# Patient Record
Sex: Female | Born: 1970 | Race: Black or African American | Hispanic: No | Smoking: Former smoker
Health system: Southern US, Community
[De-identification: ages and names within clinical notes are randomized; demographics above are authoritative.]

## PROBLEM LIST (undated history)

## (undated) DIAGNOSIS — I10 Essential (primary) hypertension: Secondary | ICD-10-CM

## (undated) DIAGNOSIS — Z973 Presence of spectacles and contact lenses: Secondary | ICD-10-CM

## (undated) DIAGNOSIS — R51 Headache: Secondary | ICD-10-CM

## (undated) DIAGNOSIS — N611 Abscess of the breast and nipple: Secondary | ICD-10-CM

## (undated) DIAGNOSIS — F329 Major depressive disorder, single episode, unspecified: Secondary | ICD-10-CM

## (undated) DIAGNOSIS — E669 Obesity, unspecified: Secondary | ICD-10-CM

## (undated) HISTORY — DX: Obesity, unspecified: E66.9

## (undated) HISTORY — DX: Essential (primary) hypertension: I10

## (undated) HISTORY — PX: BREAST EXCISIONAL BIOPSY: SUR124

## (undated) HISTORY — DX: Abscess of the breast and nipple: N61.1

---

## 1998-04-01 ENCOUNTER — Emergency Department (HOSPITAL_COMMUNITY): Admission: EM | Admit: 1998-04-01 | Discharge: 1998-04-01 | Payer: Self-pay | Admitting: Emergency Medicine

## 1999-11-22 ENCOUNTER — Emergency Department (HOSPITAL_COMMUNITY): Admission: EM | Admit: 1999-11-22 | Discharge: 1999-11-22 | Payer: Self-pay | Admitting: Emergency Medicine

## 2000-02-20 ENCOUNTER — Inpatient Hospital Stay (HOSPITAL_COMMUNITY): Admission: AD | Admit: 2000-02-20 | Discharge: 2000-02-20 | Payer: Self-pay | Admitting: Obstetrics

## 2000-11-07 ENCOUNTER — Ambulatory Visit (HOSPITAL_COMMUNITY): Admission: RE | Admit: 2000-11-07 | Discharge: 2000-11-07 | Payer: Self-pay | Admitting: *Deleted

## 2000-11-08 ENCOUNTER — Encounter: Admission: RE | Admit: 2000-11-08 | Discharge: 2000-11-08 | Payer: Self-pay | Admitting: Obstetrics

## 2000-11-22 ENCOUNTER — Encounter: Admission: RE | Admit: 2000-11-22 | Discharge: 2000-11-22 | Payer: Self-pay | Admitting: Obstetrics

## 2000-12-06 ENCOUNTER — Encounter: Admission: RE | Admit: 2000-12-06 | Discharge: 2000-12-06 | Payer: Self-pay | Admitting: Obstetrics

## 2001-01-10 ENCOUNTER — Encounter: Admission: RE | Admit: 2001-01-10 | Discharge: 2001-01-10 | Payer: Self-pay | Admitting: Obstetrics

## 2001-01-31 ENCOUNTER — Encounter: Admission: RE | Admit: 2001-01-31 | Discharge: 2001-01-31 | Payer: Self-pay | Admitting: Obstetrics

## 2001-02-06 ENCOUNTER — Encounter: Admission: RE | Admit: 2001-02-06 | Discharge: 2001-02-06 | Payer: Self-pay | Admitting: Obstetrics

## 2001-02-13 ENCOUNTER — Encounter: Admission: RE | Admit: 2001-02-13 | Discharge: 2001-02-13 | Payer: Self-pay | Admitting: Obstetrics

## 2001-02-15 ENCOUNTER — Encounter: Admission: RE | Admit: 2001-02-15 | Discharge: 2001-05-16 | Payer: Self-pay | Admitting: Obstetrics

## 2001-03-10 ENCOUNTER — Inpatient Hospital Stay (HOSPITAL_COMMUNITY): Admission: AD | Admit: 2001-03-10 | Discharge: 2001-03-10 | Payer: Self-pay | Admitting: *Deleted

## 2001-03-13 ENCOUNTER — Encounter: Admission: RE | Admit: 2001-03-13 | Discharge: 2001-03-13 | Payer: Self-pay | Admitting: Obstetrics & Gynecology

## 2001-03-13 ENCOUNTER — Encounter (HOSPITAL_COMMUNITY): Admission: RE | Admit: 2001-03-13 | Discharge: 2001-03-15 | Payer: Self-pay | Admitting: Obstetrics & Gynecology

## 2001-03-14 ENCOUNTER — Inpatient Hospital Stay (HOSPITAL_COMMUNITY): Admission: AD | Admit: 2001-03-14 | Discharge: 2001-03-17 | Payer: Self-pay | Admitting: Obstetrics & Gynecology

## 2001-03-14 ENCOUNTER — Encounter (INDEPENDENT_AMBULATORY_CARE_PROVIDER_SITE_OTHER): Payer: Self-pay

## 2001-03-19 ENCOUNTER — Inpatient Hospital Stay (HOSPITAL_COMMUNITY): Admission: AD | Admit: 2001-03-19 | Discharge: 2001-03-19 | Payer: Self-pay

## 2001-04-22 ENCOUNTER — Inpatient Hospital Stay (HOSPITAL_COMMUNITY): Admission: AD | Admit: 2001-04-22 | Discharge: 2001-04-22 | Payer: Self-pay | Admitting: Obstetrics & Gynecology

## 2003-06-09 ENCOUNTER — Encounter: Payer: Self-pay | Admitting: Emergency Medicine

## 2003-06-09 ENCOUNTER — Emergency Department (HOSPITAL_COMMUNITY): Admission: EM | Admit: 2003-06-09 | Discharge: 2003-06-09 | Payer: Self-pay | Admitting: Emergency Medicine

## 2004-01-03 ENCOUNTER — Emergency Department (HOSPITAL_COMMUNITY): Admission: EM | Admit: 2004-01-03 | Discharge: 2004-01-03 | Payer: Self-pay | Admitting: Emergency Medicine

## 2005-10-02 DIAGNOSIS — F32A Depression, unspecified: Secondary | ICD-10-CM

## 2005-10-02 HISTORY — DX: Depression, unspecified: F32.A

## 2006-07-18 ENCOUNTER — Ambulatory Visit: Payer: Self-pay | Admitting: Nurse Practitioner

## 2006-07-18 ENCOUNTER — Ambulatory Visit: Payer: Self-pay | Admitting: *Deleted

## 2006-11-21 ENCOUNTER — Emergency Department (HOSPITAL_COMMUNITY): Admission: EM | Admit: 2006-11-21 | Discharge: 2006-11-21 | Payer: Self-pay | Admitting: Emergency Medicine

## 2007-04-19 ENCOUNTER — Emergency Department (HOSPITAL_COMMUNITY): Admission: EM | Admit: 2007-04-19 | Discharge: 2007-04-19 | Payer: Self-pay | Admitting: Emergency Medicine

## 2007-06-19 ENCOUNTER — Encounter (INDEPENDENT_AMBULATORY_CARE_PROVIDER_SITE_OTHER): Payer: Self-pay | Admitting: *Deleted

## 2007-06-27 ENCOUNTER — Ambulatory Visit: Payer: Self-pay | Admitting: Family Medicine

## 2007-07-23 ENCOUNTER — Ambulatory Visit: Payer: Self-pay | Admitting: Family Medicine

## 2007-10-03 DIAGNOSIS — I10 Essential (primary) hypertension: Secondary | ICD-10-CM

## 2007-10-03 HISTORY — DX: Essential (primary) hypertension: I10

## 2007-10-04 ENCOUNTER — Emergency Department (HOSPITAL_COMMUNITY): Admission: EM | Admit: 2007-10-04 | Discharge: 2007-10-04 | Payer: Self-pay | Admitting: Emergency Medicine

## 2007-12-26 ENCOUNTER — Emergency Department (HOSPITAL_COMMUNITY): Admission: EM | Admit: 2007-12-26 | Discharge: 2007-12-27 | Payer: Self-pay | Admitting: Emergency Medicine

## 2008-01-17 ENCOUNTER — Ambulatory Visit: Payer: Self-pay | Admitting: Internal Medicine

## 2008-01-23 ENCOUNTER — Ambulatory Visit (HOSPITAL_COMMUNITY): Admission: RE | Admit: 2008-01-23 | Discharge: 2008-01-23 | Payer: Self-pay | Admitting: Family Medicine

## 2008-11-23 ENCOUNTER — Ambulatory Visit: Payer: Self-pay | Admitting: Family Medicine

## 2009-02-01 ENCOUNTER — Ambulatory Visit: Payer: Self-pay | Admitting: Internal Medicine

## 2009-02-23 ENCOUNTER — Ambulatory Visit (HOSPITAL_COMMUNITY): Admission: RE | Admit: 2009-02-23 | Discharge: 2009-02-23 | Payer: Self-pay | Admitting: Internal Medicine

## 2009-11-20 ENCOUNTER — Emergency Department (HOSPITAL_COMMUNITY): Admission: EM | Admit: 2009-11-20 | Discharge: 2009-11-20 | Payer: Self-pay | Admitting: Emergency Medicine

## 2009-11-22 ENCOUNTER — Emergency Department (HOSPITAL_COMMUNITY): Admission: EM | Admit: 2009-11-22 | Discharge: 2009-11-22 | Payer: Self-pay | Admitting: Emergency Medicine

## 2010-03-10 ENCOUNTER — Emergency Department (HOSPITAL_COMMUNITY): Admission: EM | Admit: 2010-03-10 | Discharge: 2010-03-10 | Payer: Self-pay | Admitting: Emergency Medicine

## 2010-04-06 ENCOUNTER — Ambulatory Visit: Payer: Self-pay | Admitting: Internal Medicine

## 2010-04-06 LAB — CONVERTED CEMR LAB: Microalb, Ur: 10.67 mg/dL — ABNORMAL HIGH (ref 0.00–1.89)

## 2010-04-21 ENCOUNTER — Ambulatory Visit: Payer: Self-pay | Admitting: Internal Medicine

## 2010-04-21 LAB — CONVERTED CEMR LAB
ALT: 9 units/L (ref 0–35)
AST: 9 units/L (ref 0–37)
Albumin: 4.6 g/dL (ref 3.5–5.2)
Alkaline Phosphatase: 97 units/L (ref 39–117)
BUN: 12 mg/dL (ref 6–23)
CO2: 21 meq/L (ref 19–32)
Calcium: 9.7 mg/dL (ref 8.4–10.5)
Chloride: 100 meq/L (ref 96–112)
Creatinine, Ser: 0.68 mg/dL (ref 0.40–1.20)
Direct LDL: 131 mg/dL — ABNORMAL HIGH
Glucose, Bld: 152 mg/dL — ABNORMAL HIGH (ref 70–99)
Potassium: 4.2 meq/L (ref 3.5–5.3)
Sodium: 135 meq/L (ref 135–145)
Total Bilirubin: 0.4 mg/dL (ref 0.3–1.2)
Total Protein: 8 g/dL (ref 6.0–8.3)

## 2010-05-03 ENCOUNTER — Ambulatory Visit: Payer: Self-pay | Admitting: Internal Medicine

## 2010-05-04 ENCOUNTER — Encounter (INDEPENDENT_AMBULATORY_CARE_PROVIDER_SITE_OTHER): Payer: Self-pay | Admitting: Internal Medicine

## 2010-05-19 ENCOUNTER — Ambulatory Visit: Payer: Self-pay | Admitting: Internal Medicine

## 2010-06-16 ENCOUNTER — Encounter (INDEPENDENT_AMBULATORY_CARE_PROVIDER_SITE_OTHER): Payer: Self-pay | Admitting: Internal Medicine

## 2010-06-16 LAB — CONVERTED CEMR LAB
BUN: 15 mg/dL (ref 6–23)
CO2: 26 meq/L (ref 19–32)
Calcium: 8.9 mg/dL (ref 8.4–10.5)
Chloride: 102 meq/L (ref 96–112)
Cholesterol: 199 mg/dL (ref 0–200)
Creatinine, Ser: 0.94 mg/dL (ref 0.40–1.20)
Glucose, Bld: 142 mg/dL — ABNORMAL HIGH (ref 70–99)
HDL: 37 mg/dL — ABNORMAL LOW (ref >39)
Hgb A1c MFr Bld: 7.4 % — ABNORMAL HIGH (ref <5.7)
LDL Cholesterol: 144 mg/dL — ABNORMAL HIGH (ref 0–99)
Potassium: 4.3 meq/L (ref 3.5–5.3)
Sodium: 138 meq/L (ref 135–145)
TSH: 1.104 microintl units/mL (ref 0.350–4.500)
Total CHOL/HDL Ratio: 5.4
Triglycerides: 89 mg/dL (ref <150)
VLDL: 18 mg/dL (ref 0–40)

## 2010-10-18 ENCOUNTER — Emergency Department (HOSPITAL_COMMUNITY)
Admission: EM | Admit: 2010-10-18 | Discharge: 2010-10-18 | Payer: Self-pay | Source: Home / Self Care | Admitting: Emergency Medicine

## 2010-10-19 LAB — CBC
HCT: 38.8 % (ref 36.0–46.0)
Hemoglobin: 13 g/dL (ref 12.0–15.0)
MCH: 25.8 pg — ABNORMAL LOW (ref 26.0–34.0)
MCHC: 33.5 g/dL (ref 30.0–36.0)
MCV: 77.1 fL — ABNORMAL LOW (ref 78.0–100.0)
Platelets: 368 10*3/uL (ref 150–400)
RBC: 5.03 MIL/uL (ref 3.87–5.11)
RDW: 14.3 % (ref 11.5–15.5)
WBC: 18.6 10*3/uL — ABNORMAL HIGH (ref 4.0–10.5)

## 2010-10-19 LAB — DIFFERENTIAL
Basophils Absolute: 0 10*3/uL (ref 0.0–0.1)
Basophils Relative: 0 % (ref 0–1)
Eosinophils Absolute: 0.1 10*3/uL (ref 0.0–0.7)
Eosinophils Relative: 0 % (ref 0–5)
Lymphocytes Relative: 10 % — ABNORMAL LOW (ref 12–46)
Lymphs Abs: 1.9 10*3/uL (ref 0.7–4.0)
Monocytes Absolute: 1 10*3/uL (ref 0.1–1.0)
Monocytes Relative: 5 % (ref 3–12)
Neutro Abs: 15.7 10*3/uL — ABNORMAL HIGH (ref 1.7–7.7)
Neutrophils Relative %: 84 % — ABNORMAL HIGH (ref 43–77)

## 2010-10-19 LAB — GLUCOSE, CAPILLARY
Glucose-Capillary: 223 mg/dL — ABNORMAL HIGH (ref 70–99)
Glucose-Capillary: 224 mg/dL — ABNORMAL HIGH (ref 70–99)

## 2010-10-19 LAB — URINALYSIS, ROUTINE W REFLEX MICROSCOPIC
Bilirubin Urine: NEGATIVE
Hgb urine dipstick: NEGATIVE
Ketones, ur: 15 mg/dL — AB
Nitrite: NEGATIVE
Protein, ur: >300 mg/dL — AB
Specific Gravity, Urine: 1.019 (ref 1.005–1.030)
Urine Glucose, Fasting: NEGATIVE mg/dL
Urobilinogen, UA: 1 mg/dL (ref 0.0–1.0)
pH: 6 (ref 5.0–8.0)

## 2010-10-19 LAB — COMPREHENSIVE METABOLIC PANEL
ALT: 12 U/L (ref 0–35)
AST: 14 U/L (ref 0–37)
Albumin: 4.1 g/dL (ref 3.5–5.2)
Alkaline Phosphatase: 95 U/L (ref 39–117)
BUN: 6 mg/dL (ref 6–23)
CO2: 24 mEq/L (ref 19–32)
Calcium: 9.7 mg/dL (ref 8.4–10.5)
Chloride: 102 mEq/L (ref 96–112)
Creatinine, Ser: 0.57 mg/dL (ref 0.4–1.2)
GFR calc Af Amer: >60 mL/min (ref >60)
GFR calc non Af Amer: >60 mL/min (ref >60)
Glucose, Bld: 209 mg/dL — ABNORMAL HIGH (ref 70–99)
Potassium: 3.8 mEq/L (ref 3.5–5.1)
Sodium: 138 mEq/L (ref 135–145)
Total Bilirubin: 0.6 mg/dL (ref 0.3–1.2)
Total Protein: 8.5 g/dL — ABNORMAL HIGH (ref 6.0–8.3)

## 2010-10-19 LAB — URINE MICROSCOPIC-ADD ON

## 2010-10-19 LAB — POCT PREGNANCY, URINE: Preg Test, Ur: NEGATIVE

## 2010-10-23 ENCOUNTER — Encounter: Payer: Self-pay | Admitting: Nurse Practitioner

## 2010-10-24 ENCOUNTER — Encounter: Payer: Self-pay | Admitting: Family Medicine

## 2010-12-19 LAB — URINALYSIS, ROUTINE W REFLEX MICROSCOPIC
Bilirubin Urine: NEGATIVE
Glucose, UA: 100 mg/dL — AB
Ketones, ur: NEGATIVE mg/dL
Nitrite: NEGATIVE
Protein, ur: 30 mg/dL — AB
Specific Gravity, Urine: 1.018 (ref 1.005–1.030)
Urobilinogen, UA: 1 mg/dL (ref 0.0–1.0)
pH: 6 (ref 5.0–8.0)

## 2010-12-19 LAB — GC/CHLAMYDIA PROBE AMP, GENITAL
Chlamydia, DNA Probe: NEGATIVE
GC Probe Amp, Genital: NEGATIVE

## 2010-12-19 LAB — CBC
HCT: 35.8 % — ABNORMAL LOW (ref 36.0–46.0)
Hemoglobin: 12.1 g/dL (ref 12.0–15.0)
MCHC: 33.8 g/dL (ref 30.0–36.0)
MCV: 80.7 fL (ref 78.0–100.0)
Platelets: 331 10*3/uL (ref 150–400)
RBC: 4.43 MIL/uL (ref 3.87–5.11)
RDW: 13.1 % (ref 11.5–15.5)
WBC: 11.4 10*3/uL — ABNORMAL HIGH (ref 4.0–10.5)

## 2010-12-19 LAB — URINE CULTURE: Colony Count: 75000

## 2010-12-19 LAB — POCT I-STAT, CHEM 8
BUN: 7 mg/dL (ref 6–23)
Calcium, Ion: 1.16 mmol/L (ref 1.12–1.32)
Chloride: 103 mEq/L (ref 96–112)
Creatinine, Ser: 0.5 mg/dL (ref 0.4–1.2)
Glucose, Bld: 264 mg/dL — ABNORMAL HIGH (ref 70–99)
HCT: 38 % (ref 36.0–46.0)
Hemoglobin: 12.9 g/dL (ref 12.0–15.0)
Potassium: 3.8 mEq/L (ref 3.5–5.1)
Sodium: 139 mEq/L (ref 135–145)
TCO2: 28 mmol/L (ref 0–100)

## 2010-12-19 LAB — DIFFERENTIAL
Basophils Absolute: 0 10*3/uL (ref 0.0–0.1)
Basophils Relative: 0 % (ref 0–1)
Eosinophils Absolute: 0.2 10*3/uL (ref 0.0–0.7)
Eosinophils Relative: 1 % (ref 0–5)
Lymphocytes Relative: 16 % (ref 12–46)
Lymphs Abs: 1.8 10*3/uL (ref 0.7–4.0)
Monocytes Absolute: 0.6 10*3/uL (ref 0.1–1.0)
Monocytes Relative: 6 % (ref 3–12)
Neutro Abs: 8.7 10*3/uL — ABNORMAL HIGH (ref 1.7–7.7)
Neutrophils Relative %: 77 % (ref 43–77)

## 2010-12-19 LAB — URINE MICROSCOPIC-ADD ON

## 2010-12-19 LAB — PREGNANCY, URINE: Preg Test, Ur: NEGATIVE

## 2010-12-19 LAB — GLUCOSE, CAPILLARY: Glucose-Capillary: 229 mg/dL — ABNORMAL HIGH (ref 70–99)

## 2010-12-19 LAB — WET PREP, GENITAL

## 2010-12-23 LAB — CBC
HCT: 37.5 % (ref 36.0–46.0)
Hemoglobin: 12.6 g/dL (ref 12.0–15.0)
MCHC: 33.8 g/dL (ref 30.0–36.0)
MCV: 80.4 fL (ref 78.0–100.0)
Platelets: 305 K/uL (ref 150–400)
RBC: 4.66 MIL/uL (ref 3.87–5.11)
RDW: 14.4 % (ref 11.5–15.5)
WBC: 28.2 K/uL — ABNORMAL HIGH (ref 4.0–10.5)

## 2010-12-23 LAB — DIFFERENTIAL
Basophils Relative: 0 % (ref 0–1)
Eosinophils Relative: 0 % (ref 0–5)
Lymphs Abs: 2.3 10*3/uL (ref 0.7–4.0)
Monocytes Absolute: 2 10*3/uL — ABNORMAL HIGH (ref 0.1–1.0)
Monocytes Relative: 7 % (ref 3–12)

## 2010-12-23 LAB — GLUCOSE, CAPILLARY

## 2011-01-19 ENCOUNTER — Inpatient Hospital Stay (INDEPENDENT_AMBULATORY_CARE_PROVIDER_SITE_OTHER)
Admission: RE | Admit: 2011-01-19 | Discharge: 2011-01-19 | Disposition: A | Discharge status: Home / Self Care | Payer: Self-pay | Source: Ambulatory Visit | Attending: Family Medicine | Admitting: Family Medicine

## 2011-01-19 DIAGNOSIS — B9789 Other viral agents as the cause of diseases classified elsewhere: Secondary | ICD-10-CM

## 2011-01-19 DIAGNOSIS — J029 Acute pharyngitis, unspecified: Secondary | ICD-10-CM

## 2011-01-19 LAB — POCT RAPID STREP A (OFFICE): Streptococcus, Group A Screen (Direct): NEGATIVE

## 2011-02-17 NOTE — Discharge Summary (Signed)
[facility]  Patient:    Sheila Bishop, PELLICANO Visit Number: [id number] MRN: [id number]          Service Type: GYN Location: [facility] Attending Physician:  Antionette Char Dictated by:   Michell Heinrich, M.D. Admit Date:  04/22/2001 Discharge Date: 04/22/2001                             Discharge Summary  ADMITTING DIAGNOSES:          1. Full-term intrauterine gestation.                               2. History of previous cesarean delivery.                               3. Gestational hypertension.                               4. Gestational diabetes--poorly controlled.  DISCHARGE DIAGNOSES:          1. Full-term intrauterine gestation.                               2. History of previous cesarean delivery.                               3. Gestational hypertension.                               4. Gestational diabetes--poorly controlled.                               5. Successful cesarean delivery of a viable                                  female infant.  SERVICE:                      OB Teaching Service.  ATTENDING:                    Roseanna Rainbow, M.D.  RESIDENT:                     Michell Heinrich, M.D.  CONSULTS:                     None.  PROCEDURES:                   On March 14, 2001: Elective low transverse                               cesarean section.  FINDINGS:                     Viable female infant with Apgars of 8 at one and  9 at five minutes.  HISTORY AND PHYSICAL:         For complete H&P, please see resident H&P in chart. Briefly, this is a 40 year old multipara with a history of prior cesarean delivery who presented at 40+ weeks gestation for elective cesarean delivery. The patient had a history of gestational hypertension and poorly controlled gestational diabetes mellitus this pregnancy.  PHYSICAL EXAMINATION:         Physical exam at presentation was notable for  a blood pressure of 140/80 with vitals otherwise normal. Physical exam revealed a gravid uterus and a cervix which was long and closed.  HOSPITAL COURSE:              The patient was admitted to the hospital for her elective repeat low transverse cesarean section and this was performed on March 14, 2001 without complication. Postoperatively, her blood pressure was in the normal range and she had no infectious problems. She had no difficulty with ambulation or advancement of diet, and had only a small amount of incisional pain. The patient was discharged home in improved condition and was to follow up with [facility] in six weeks. She was bottle feeding the baby.  DISCHARGE MEDICATIONS:        1. Percocet one to two q.4-6h. p.r.n. pain.                               2. Ibuprofen 600 mg q.6h. p.r.n. pain.                               3. Prenatal vitamin for six weeks. ictated by:   Michell Heinrich, M.D. Attending Physician:  Antionette Char DD:  07/31/01 TD:  07/31/01 Job: [id number]

## 2011-02-17 NOTE — Op Note (Signed)
[facility]  Patient:    Sheila Bishop, Sheila Bishop                      MRN: [id number] Proc. Date: 03/14/01 Adm. Date:  [id number] Disc. Date: [id number] Attending:  Antionette Char                           Operative Report  PREOPERATIVE DIAGNOSES:       Intrauterine pregnancy at 40+ weeks. Gestational diabetes with poorly controlled gestational hypertension.  History of previous cesarean delivery; declines trial of labor.  Multiparity, desires permanent sterilization.  POSTOPERATIVE DIAGNOSES:      Intrauterine pregnancy at 40+ weeks. Gestational diabetes with poorly controlled gestational hypertension.  History of previous cesarean delivery, declines trial of labor.  Multiparity, desires permanent sterilization.  Meconium-stained amniotic fluids.  PROCEDURE:                    Repeat low transverse cesarean section and modified Pomeroy bilateral tubal ligation via Pfannenstiel.  SURGEON:                      Roseanna Rainbow, M.D.  ANESTHESIA:                   Spinal.  COMPLICATIONS:                None.  ESTIMATED BLOOD LOSS:         800 cc.  FLUIDS:                       1500 cc lactated Ringers.  URINE OUTPUT:                 300 cc clear urine at the end of the procedure.  INDICATIONS:                  The patient is a 40 year old multipara at 40+ weeks, with a history of previous cesarean section delivery, gestational diabetes and gestational hypertension.  She declines a trial of labor and desires a sterilization procedure.  FINDINGS:                     Infant in cephalic presentation.  Thin meconium noted.  The neonatologist was present at delivery.  The infant weighed 8 pounds 13 ounces.  Normal uterus, tubes and ovaries.  DESCRIPTION OF PROCEDURE:     The patient was taken to the operating room, where a spinal anesthetic was found to be adequate.  She was then prepped and draped in the usual sterile fashion, in the dorsal  supine position with a leftward tilt.  A Pfannenstiel skin incision was then made with the scalpel, carried through to the underlying layer of fascia.  The fascia was incised in the midline and the incision extended laterally with Mayo scissors.  The superior aspect of the fascial incision was then grasped with the Kocher clamp, elevated and the underlying rectus muscle dissected off.  Attention was then turned to the inferior aspect of this incision, which was manipulated in a similar fashion.  The rectus muscles were separated in the midline and the parietoperitoneum entered sharply.  The peritoneal incision was then extended superiorly and inferiorly with good visualization of the bladder.  The bladder blade was then inserted and the vesicouterine peritoneum identified, grasped with pickups and entered sharply with Metzenbaum  scissors.  This incision was then extended laterally and a bladder flap created sharply.  The bladder blade was then reinserted and the lower uterine segment incised in transverse fashion with the scalpel.  The uterine incision was then extended laterally with the bandage scissors.  The bladder blade was then removed and the infants head delivered.  The nose and mouth were suctioned, the cord was clamped and cut.  The infant was handed off to the awaiting neonatologist. Cord ______ were sent.  The placenta was then removed.  The uterus was exteriorized and cleared of all clots and debris.  The uterine incision was repaired with 0 Monocryl in a running locked fashion.  Excellent hemostasis was noted.  Attention was then turned to the fallopian tubes.  The left fallopian tube was grasped with the Babcock clamp, approximately 4 cm from the uterine cornua.  A 2 cm segment of tube was then doubly ligated with three ties of plain gut and excised.   Excellent hemostasis was noted.  The right fallopian tube was then manipulated in a similar fashion.  The uterus was  returned to the abdomen. The gutters were cleared of all clots.  The fascia was reapproximated with 0 PDS in a running fashion.  The skin was closed with staples.  The patient tolerated the procedure well.  Sponge, lap and needle counts were correct x 2. Cefotetan 1 g was given at cord clamp.  The patient was taken to the PACU in stable condition. DD:  03/18/01 TD:  03/18/01 Job: [id number]

## 2011-03-15 ENCOUNTER — Other Ambulatory Visit: Payer: Self-pay | Admitting: Family Medicine

## 2011-03-15 DIAGNOSIS — N644 Mastodynia: Secondary | ICD-10-CM

## 2011-03-15 DIAGNOSIS — N6320 Unspecified lump in the left breast, unspecified quadrant: Secondary | ICD-10-CM

## 2011-03-21 ENCOUNTER — Other Ambulatory Visit: Payer: Self-pay | Admitting: Family Medicine

## 2011-03-21 ENCOUNTER — Ambulatory Visit
Admission: RE | Admit: 2011-03-21 | Discharge: 2011-03-21 | Disposition: A | Discharge status: Home / Self Care | Payer: Self-pay | Source: Ambulatory Visit | Attending: Family Medicine | Admitting: Family Medicine

## 2011-03-21 DIAGNOSIS — N644 Mastodynia: Secondary | ICD-10-CM

## 2011-03-21 DIAGNOSIS — N6320 Unspecified lump in the left breast, unspecified quadrant: Secondary | ICD-10-CM

## 2011-04-01 ENCOUNTER — Emergency Department (HOSPITAL_COMMUNITY)
Admission: EM | Admit: 2011-04-01 | Discharge: 2011-04-02 | Disposition: A | Discharge status: Home / Self Care | Payer: Self-pay | Attending: Emergency Medicine | Admitting: Emergency Medicine

## 2011-04-01 DIAGNOSIS — D573 Sickle-cell trait: Secondary | ICD-10-CM | POA: Insufficient documentation

## 2011-04-01 DIAGNOSIS — Z79899 Other long term (current) drug therapy: Secondary | ICD-10-CM | POA: Insufficient documentation

## 2011-04-01 DIAGNOSIS — E119 Type 2 diabetes mellitus without complications: Secondary | ICD-10-CM | POA: Insufficient documentation

## 2011-04-01 DIAGNOSIS — R51 Headache: Secondary | ICD-10-CM | POA: Insufficient documentation

## 2011-04-01 DIAGNOSIS — R42 Dizziness and giddiness: Secondary | ICD-10-CM | POA: Insufficient documentation

## 2011-04-01 DIAGNOSIS — R110 Nausea: Secondary | ICD-10-CM | POA: Insufficient documentation

## 2011-04-01 DIAGNOSIS — I10 Essential (primary) hypertension: Secondary | ICD-10-CM | POA: Insufficient documentation

## 2011-04-01 DIAGNOSIS — F319 Bipolar disorder, unspecified: Secondary | ICD-10-CM | POA: Insufficient documentation

## 2011-04-04 ENCOUNTER — Ambulatory Visit
Admission: RE | Admit: 2011-04-04 | Discharge: 2011-04-04 | Disposition: A | Discharge status: Home / Self Care | Payer: Self-pay | Source: Ambulatory Visit | Attending: Family Medicine | Admitting: Family Medicine

## 2011-04-04 ENCOUNTER — Other Ambulatory Visit: Payer: Self-pay | Admitting: Family Medicine

## 2011-04-04 DIAGNOSIS — N644 Mastodynia: Secondary | ICD-10-CM

## 2011-04-04 DIAGNOSIS — N6320 Unspecified lump in the left breast, unspecified quadrant: Secondary | ICD-10-CM

## 2011-04-04 DIAGNOSIS — N63 Unspecified lump in unspecified breast: Secondary | ICD-10-CM

## 2011-04-26 ENCOUNTER — Ambulatory Visit
Admission: RE | Admit: 2011-04-26 | Discharge: 2011-04-26 | Disposition: A | Discharge status: Home / Self Care | Payer: Self-pay | Source: Ambulatory Visit | Attending: Family Medicine | Admitting: Family Medicine

## 2011-04-26 ENCOUNTER — Other Ambulatory Visit: Payer: Self-pay | Admitting: Family Medicine

## 2011-04-26 DIAGNOSIS — N63 Unspecified lump in unspecified breast: Secondary | ICD-10-CM

## 2011-04-26 DIAGNOSIS — N6320 Unspecified lump in the left breast, unspecified quadrant: Secondary | ICD-10-CM

## 2011-05-02 ENCOUNTER — Other Ambulatory Visit: Payer: Self-pay | Admitting: Family Medicine

## 2011-05-02 ENCOUNTER — Ambulatory Visit
Admission: RE | Admit: 2011-05-02 | Discharge: 2011-05-02 | Disposition: A | Discharge status: Home / Self Care | Payer: Self-pay | Source: Ambulatory Visit | Attending: Family Medicine | Admitting: Family Medicine

## 2011-05-02 DIAGNOSIS — N6320 Unspecified lump in the left breast, unspecified quadrant: Secondary | ICD-10-CM

## 2011-05-03 ENCOUNTER — Other Ambulatory Visit: Payer: Self-pay | Admitting: Family Medicine

## 2011-05-03 DIAGNOSIS — N6320 Unspecified lump in the left breast, unspecified quadrant: Secondary | ICD-10-CM

## 2011-05-24 ENCOUNTER — Other Ambulatory Visit: Payer: Self-pay | Admitting: Family Medicine

## 2011-05-24 ENCOUNTER — Ambulatory Visit
Admission: RE | Admit: 2011-05-24 | Discharge: 2011-05-24 | Disposition: A | Discharge status: Home / Self Care | Payer: No Typology Code available for payment source | Source: Ambulatory Visit | Attending: Family Medicine | Admitting: Family Medicine

## 2011-05-24 DIAGNOSIS — N6320 Unspecified lump in the left breast, unspecified quadrant: Secondary | ICD-10-CM

## 2011-05-24 DIAGNOSIS — N63 Unspecified lump in unspecified breast: Secondary | ICD-10-CM

## 2011-06-07 ENCOUNTER — Other Ambulatory Visit: Payer: No Typology Code available for payment source

## 2011-06-26 LAB — COMPREHENSIVE METABOLIC PANEL
ALT: 28
AST: 56 — ABNORMAL HIGH
Albumin: 3.5
CO2: 26
Calcium: 8.9
Creatinine, Ser: 0.61
GFR calc Af Amer: >60
GFR calc non Af Amer: >60
Sodium: 138

## 2011-06-26 LAB — CBC
MCHC: 33.5
MCV: 79.9
Platelets: 303
RBC: 4.56
WBC: 17.5 — ABNORMAL HIGH

## 2011-06-26 LAB — LIPASE, BLOOD: Lipase: 22

## 2011-06-26 LAB — DIFFERENTIAL
Eosinophils Absolute: 0.2
Lymphocytes Relative: 14
Lymphs Abs: 2.4
Monocytes Relative: 5
Neutro Abs: 13.9 — ABNORMAL HIGH
Neutrophils Relative %: 80 — ABNORMAL HIGH

## 2011-06-26 LAB — D-DIMER, QUANTITATIVE: D-Dimer, Quant: 0.86 — ABNORMAL HIGH

## 2011-07-18 ENCOUNTER — Other Ambulatory Visit: Payer: Self-pay | Admitting: Family Medicine

## 2011-07-18 DIAGNOSIS — N63 Unspecified lump in unspecified breast: Secondary | ICD-10-CM

## 2011-07-19 ENCOUNTER — Ambulatory Visit
Admission: RE | Admit: 2011-07-19 | Discharge: 2011-07-19 | Disposition: A | Discharge status: Home / Self Care | Payer: No Typology Code available for payment source | Source: Ambulatory Visit | Attending: Family Medicine | Admitting: Family Medicine

## 2011-07-19 DIAGNOSIS — N63 Unspecified lump in unspecified breast: Secondary | ICD-10-CM

## 2011-07-21 ENCOUNTER — Ambulatory Visit (INDEPENDENT_AMBULATORY_CARE_PROVIDER_SITE_OTHER): Payer: PRIVATE HEALTH INSURANCE | Admitting: Surgery

## 2011-07-21 ENCOUNTER — Encounter (INDEPENDENT_AMBULATORY_CARE_PROVIDER_SITE_OTHER): Payer: Self-pay | Admitting: Surgery

## 2011-07-21 VITALS — BP 100/70 | HR 84 | Temp 98.0°F | Resp 16 | Ht 66.0 in | Wt 210.8 lb

## 2011-07-21 DIAGNOSIS — N611 Abscess of the breast and nipple: Secondary | ICD-10-CM | POA: Insufficient documentation

## 2011-07-21 DIAGNOSIS — N6019 Diffuse cystic mastopathy of unspecified breast: Secondary | ICD-10-CM

## 2011-07-21 MED ORDER — DOXYCYCLINE HYCLATE 100 MG PO TABS: 100.0000 mg | ORAL_TABLET | Freq: Two times a day (BID) | ORAL | Status: AC

## 2011-07-21 NOTE — Patient Instructions (Signed)
Mastitis  Mastitis is a bacterial infection of the breast tissue. CAUSES  Bacteria causes infection by entering the breast tissue through cuts or openings in the skin. Typically, this occurs with breastfeeding due to cracked or irritated skin. It can be associated with plugged ducts. Nipple piercing can also lead to mastitis. SYMPTOMS  In mastitis, an area of the breast becomes swollen, red, tender, and painful. You may notice you have a fever and swelling of the glands under your arm on that side. If the infection is allowed to progress, a collection of pus (abscess) may develop. DIAGNOSIS  Your caregiver can diagnose mastitis based on your symptoms and upon examination. The diagnosis can be confirmed if pus can be expressed from the breast. This pus can be examined in the lab to determine which bacteria are present. If an abscess has developed, the fluid in the abscess can be removed with a needle. This is used to confirm the diagnosis and determine the bacteria present. In most cases, pus will not be present. Blood tests can be done to determine if your body is fighting a bacterial infection. Sometimes, a mammogram or ultrasound will be recommended to exclude other breast diseases including cancer. Other rare forms of mastitis:  Tuberculosis mastitis is rare. The TB germ can affect the breast if it is present in some other part of the body. The breast may be slightly tender with a mass, but not tender or painful.   Syphilis of the nipple usually has an ulcer that is not tender.   Actinomycosis is a very rare bacterial infection of the breast that presents as a mass in the breast that is not tender or painful.   Phlebitis (inflammation of blood vessels) of the breast is an inflammation of the veins in the breast. It may be caused by tight fitting bras, surgery, or trauma to the breast.   Inflammatory carcinoma of the breast looks like mastitis because the breasts are red, swollen, or tender, but  it is a rare form of breast cancer.  TREATMENT  Antibiotic medication is used to treat the bacterial infection. Your caregiver will determine which bacteria are most likely to be causing the infection and select an antibiotic. This is sometimes changed based on the results of cultures, or if there is no response to the antibiotic selected. Antibiotics are usually given by mouth. If you are breastfeeding, it is important to continue to empty the breast. Your caregiver can tell you whether or not this milk is safe for your infant, or needs to be thrown away. Pain can usually be treated with medication. HOME CARE INSTRUCTIONS   Take your antibiotics as directed. Finish them even if you start to feel better.   Only take over-the-counter or prescription medication for pain, discomfort, or fever as directed by your caregiver.   If breastfeeding, keep your nipples clean and dry. Your caregiver may tell you to stop nursing until he or she feels it is safe for your baby. Use a breast pump as instructed if forced to stop nursing.   Do not wear a tight bra. Wear a good support bra.   Empty the first breast completely before going to the other breast. If your baby is not emptying your breasts completely for some reason, use a breast pump to empty your breasts.   If you go back to work, pump your breasts while at work to stay in time with your nursing schedule.   Increase your fluid intake especially if   you have a fever.   Avoid having your breasts get overly filled with milk (engorged).  SEEK MEDICAL CARE IF:   You develop pus-like (purulent) discharge from the breast.   Your symptoms get worse.   You do not seem to be responding to your treatment within 2 days.  SEEK IMMEDIATE MEDICAL CARE IF:   You have a fever.   Your pain and swelling is getting worse.   You develop pain that is not controlled with medicine.   You develop a red line extending from the breast toward your armpit.  Document  Released: 09/18/2005 Document Revised: 05/31/2011 Document Reviewed: 05/08/2008 Exit[employer] Patient Information 2012 [employer]. 

## 2011-07-21 NOTE — Progress Notes (Signed)
Chief Complaint  Patient presents with  . Other    new pt- eval lft br abscess    HPI Sheila Bishop is a 40 y.o. female.  HPI The patient presents at the request of Dr. Deboraha Sprang due to history of persistent left breast abscess and mastitis. She developed an abscess in her left breast back in July of this year and this was managed with drainage and antibiotics. It resolved. Recently, she developed more left breast swelling redness and pain in repeat ultrasound showed recurrence of a left breast abscess between the 12:00 and 3:00 position approximately 2 cm from the nipple. She's been on antibiotics off and on during this time and this seems to help. Once she comes off of antibiotics the abscess returns. Currently, she has no fever or chills. She only has left breast soreness and fullness in the area of the previous abscess.  Past Medical History  Diagnosis Date  . Breast abscess     left  . Diabetes mellitus   . Hypertension     Past Surgical History  Procedure Date  . Cesarean section     History reviewed. No pertinent family history.  Social History History  Substance Use Topics  . Smoking status: Former Games developer  . Smokeless tobacco: Not on file  . Alcohol Use: No    No Known Allergies  Current Outpatient Prescriptions  Medication Sig Dispense Refill  . diphenhydrAMINE (SOMINEX) 25 MG tablet Take 25 mg by mouth 2 (two) times daily.        Marland Kitchen FLUoxetine (PROZAC) 20 MG tablet Take 20 mg by mouth daily.        Marland Kitchen glipiZIDE (GLUCOTROL) 10 MG tablet Take 10 mg by mouth 2 (two) times daily before a meal.        . lisinopril (PRINIVIL,ZESTRIL) 20 MG tablet Take 20 mg by mouth daily.        . risperiDONE (RISPERDAL) 2 MG tablet Take 2 mg by mouth 2 (two) times daily.        . risperiDONE microspheres (RISPERDAL CONSTA) 37.5 MG injection Inject 37.5 mg into the muscle every 14 (fourteen) days.          Review of Systems Review of Systems  Constitutional: Negative for fever, chills  and unexpected weight change.  HENT: Negative for hearing loss, congestion, sore throat, trouble swallowing and voice change.   Eyes: Negative for visual disturbance.  Respiratory: Negative for cough and wheezing.   Cardiovascular: Negative for chest pain, palpitations and leg swelling.  Gastrointestinal: Negative for nausea, vomiting, abdominal pain, diarrhea, constipation, blood in stool, abdominal distention and anal bleeding.  Genitourinary: Negative for hematuria, vaginal bleeding and difficulty urinating.  Musculoskeletal: Negative for arthralgias.  Skin: Negative for rash and wound.  Neurological: Negative for seizures, syncope and headaches.  Hematological: Negative for adenopathy. Does not bruise/bleed easily.  Psychiatric/Behavioral: Negative for confusion.    Blood pressure 100/70, pulse 84, temperature 98 F (36.7 C), temperature source Temporal, resp. rate 16, height 5\' 6"  (1.676 m), weight 210 lb 12.8 oz (95.618 kg).  Physical Exam Physical Exam  Constitutional: She is oriented to person, place, and time. She appears well-developed and well-nourished.  HENT:  Head: Normocephalic and atraumatic.  Nose: Nose normal.  Eyes: EOM are normal. Pupils are equal, round, and reactive to light.  Neck: Normal range of motion. Neck supple.  Cardiovascular: Normal rate.  Exam reveals no friction rub.   No murmur heard. Pulmonary/Chest: Effort normal and breath sounds normal.  Left breast shows fullness in in the 12 to 3:00 position in the upper outer quadrant. This is mildly tender but not fluctuant. There is no nipple discharge. Left axilla normal. Right breast normal.  Musculoskeletal: Normal range of motion.  Neurological: She is alert and oriented to person, place, and time.  Skin: Skin is warm and dry.  Psychiatric: She has a normal mood and affect. Her behavior is normal. Judgment and thought content normal.    Data Reviewed Left breast ultrasound July 2012 and October  2012 reviewed  Persistent area left breast upper-outer quadrant 2 cm from the nipple between the 12 and 3:00 position consistent with chronic mastitis and abscess. Biopsy of this area done in July of 2012 negative for malignancy.  Assessment    Chronic left breast mastitis and abscess    Plan    I discussed options with the patient of management. She has been managed medically since July of this year. This helps in the short-term but the process recurs despite drainage and antibiotics. I recommended excision of this area in her left breast to hopefully keep this from recurring. Risk of surgery include bleeding, infection, the need to leave the wound open, recurrence and the need for further surgery. Success rates for the surgery or around 75% at relieving the patient's symptoms. Alternative therapy is medical management and she has tried this. I will place her on doxycycline for the next 2 weeks prior to surgery to hopefully reduce or infection risk and wound complication rate that this is an issue which I discussed with her today. She would like to proceed.       Keion Neels A. 07/21/2011, 12:03 PM

## 2011-08-02 ENCOUNTER — Other Ambulatory Visit (INDEPENDENT_AMBULATORY_CARE_PROVIDER_SITE_OTHER): Payer: Self-pay | Admitting: Surgery

## 2011-08-02 ENCOUNTER — Encounter (HOSPITAL_COMMUNITY): Payer: Self-pay

## 2011-08-02 ENCOUNTER — Encounter (HOSPITAL_COMMUNITY)
Admission: RE | Admit: 2011-08-02 | Discharge: 2011-08-02 | Disposition: A | Discharge status: Home / Self Care | Payer: Self-pay | Source: Ambulatory Visit | Attending: Surgery | Admitting: Surgery

## 2011-08-02 ENCOUNTER — Ambulatory Visit (HOSPITAL_COMMUNITY)
Admission: RE | Admit: 2011-08-02 | Discharge: 2011-08-02 | Disposition: A | Discharge status: Home / Self Care | Payer: Self-pay | Source: Ambulatory Visit | Attending: Surgery | Admitting: Surgery

## 2011-08-02 DIAGNOSIS — N63 Unspecified lump in unspecified breast: Secondary | ICD-10-CM

## 2011-08-02 DIAGNOSIS — Z01818 Encounter for other preprocedural examination: Secondary | ICD-10-CM | POA: Insufficient documentation

## 2011-08-02 DIAGNOSIS — Z01812 Encounter for preprocedural laboratory examination: Secondary | ICD-10-CM | POA: Insufficient documentation

## 2011-08-02 DIAGNOSIS — Z01810 Encounter for preprocedural cardiovascular examination: Secondary | ICD-10-CM | POA: Insufficient documentation

## 2011-08-02 HISTORY — DX: Headache: R51

## 2011-08-02 HISTORY — DX: Presence of spectacles and contact lenses: Z97.3

## 2011-08-02 HISTORY — DX: Major depressive disorder, single episode, unspecified: F32.9

## 2011-08-02 LAB — COMPREHENSIVE METABOLIC PANEL
Albumin: 3.5 g/dL (ref 3.5–5.2)
BUN: 8 mg/dL (ref 6–23)
Calcium: 9.4 mg/dL (ref 8.4–10.5)
Creatinine, Ser: 0.48 mg/dL — ABNORMAL LOW (ref 0.50–1.10)
Potassium: 3.7 mEq/L (ref 3.5–5.1)
Total Protein: 7.2 g/dL (ref 6.0–8.3)

## 2011-08-02 LAB — SURGICAL PCR SCREEN
MRSA, PCR: NEGATIVE
Staphylococcus aureus: POSITIVE — AB

## 2011-08-02 LAB — HEMOGLOBIN A1C
Hgb A1c MFr Bld: 7.7 % — ABNORMAL HIGH (ref <5.7)
Mean Plasma Glucose: 174 mg/dL — ABNORMAL HIGH (ref <117)

## 2011-08-02 LAB — CBC
HCT: 31.9 % — ABNORMAL LOW (ref 36.0–46.0)
MCHC: 32.6 g/dL (ref 30.0–36.0)
RDW: 13.9 % (ref 11.5–15.5)

## 2011-08-02 LAB — DIFFERENTIAL
Basophils Absolute: 0 10*3/uL (ref 0.0–0.1)
Eosinophils Relative: 2 % (ref 0–5)
Lymphocytes Relative: 26 % (ref 12–46)
Monocytes Absolute: 0.8 10*3/uL (ref 0.1–1.0)

## 2011-08-02 NOTE — Pre-Procedure Instructions (Signed)
[address]  08/02/2011   Your procedure is scheduled on:  08-07-2011   Report to [facility] at 700AM  Call this number if you have problems the morning of surgery: [phone]   Remember:   Do not eat food:After Midnight.  Do not drink clear liquids: 4 Hours before arrival.  Take these medicines the morning of surgery with A SIP OF WATER: Prozac 20 mg, risperidal 2 mg     Do not wear jewelry, make-up or nail polish.  Do not wear lotions, powders, or perfumes. You may wear deodorant.  Do not shave 48 hours prior to surgery.  Do not bring valuables to the hospital.  Contacts, dentures or bridgework may not be worn into surgery.  Leave suitcase in the car. After surgery it may be brought to your room.  For patients admitted to the hospital, checkout time is 11:00 AM the day of discharge.   Patients discharged the day of surgery will not be allowed to drive home.  Name and phone number of your driver:Marachia Barrett Henle  [phone]  Special Instructions: CHG Shower Use Special Wash: 1/2 bottle night before surgery and 1/2 bottle morning of surgery.   Please read over the following fact sheets that you were given: Coughing and Deep Breathing, MRSA Information and Surgical Site Infection Prevention

## 2011-08-03 DIAGNOSIS — N611 Abscess of the breast and nipple: Secondary | ICD-10-CM

## 2011-08-03 HISTORY — DX: Abscess of the breast and nipple: N61.1

## 2011-08-05 ENCOUNTER — Encounter (HOSPITAL_COMMUNITY): Payer: Self-pay | Admitting: Surgery

## 2011-08-05 NOTE — H&P (Signed)
Sheila Bishop   07/21/2011 11:00 AM Office Visit  MRN: [id number]   Description: 40 year old female  Provider: Dortha Schwalbe., MD  Department: [facility]        Diagnoses     Mastitis chronic   - Primary    610.1    left      Reason for Visit     Other    new pt- eval lft br abscess        Vitals - Last Recorded       BP Pulse Temp(Src) Resp Ht Wt    100/70  84  98 F (36.7 C) (Temporal)  16  5\' 6"  (1.676 m)  210 lb 12.8 oz (95.618 kg)          BMI              34.02 kg/m2                 Progress Notes     Nicholaos Schippers A., MD  07/21/2011 12:13 PM  SignedChief Complaint   Patient presents with   .  Other       new pt- eval lft br abscess      HPI Sheila Bishop is a 40 y.o. female.  HPI The patient presents at the request of Dr. Deboraha Sprang due to history of persistent left breast abscess and mastitis. She developed an abscess in her left breast back in July of this year and this was managed with drainage and antibiotics. It resolved. Recently, she developed more left breast swelling redness and pain in repeat ultrasound showed recurrence of a left breast abscess between the 12:00 and 3:00 position approximately 2 cm from the nipple. She's been on antibiotics off and on during this time and this seems to help. Once she comes off of antibiotics the abscess returns. Currently, she has no fever or chills. She only has left breast soreness and fullness in the area of the previous abscess.    Past Medical History   Diagnosis  Date   .  Breast abscess         left   .  Diabetes mellitus     .  Hypertension         Past Surgical History   Procedure  Date   .  Cesarean section        History reviewed. No pertinent family history.   Social History History   Substance Use Topics   .  Smoking status:  Former Games developer   .  Smokeless tobacco:  Not on file   .  Alcohol Use:  No      No Known Allergies    Current Outpatient Prescriptions     Medication  Sig  Dispense  Refill   .  diphenhydrAMINE (SOMINEX) 25 MG tablet  Take 25 mg by mouth 2 (two) times daily.           Marland Kitchen  FLUoxetine (PROZAC) 20 MG tablet  Take 20 mg by mouth daily.           Marland Kitchen  glipiZIDE (GLUCOTROL) 10 MG tablet  Take 10 mg by mouth 2 (two) times daily before a meal.           .  lisinopril (PRINIVIL,ZESTRIL) 20 MG tablet  Take 20 mg by mouth daily.           .  risperiDONE (RISPERDAL) 2 MG tablet  Take 2 mg by mouth 2 (two) times  daily.           .  risperiDONE microspheres (RISPERDAL CONSTA) 37.5 MG injection  Inject 37.5 mg into the muscle every 14 (fourteen) days.              Review of Systems Review of Systems  Constitutional: Negative for fever, chills and unexpected weight change.  HENT: Negative for hearing loss, congestion, sore throat, trouble swallowing and voice change.   Eyes: Negative for visual disturbance.  Respiratory: Negative for cough and wheezing.   Cardiovascular: Negative for chest pain, palpitations and leg swelling.  Gastrointestinal: Negative for nausea, vomiting, abdominal pain, diarrhea, constipation, blood in stool, abdominal distention and anal bleeding.  Genitourinary: Negative for hematuria, vaginal bleeding and difficulty urinating.  Musculoskeletal: Negative for arthralgias.  Skin: Negative for rash and wound.  Neurological: Negative for seizures, syncope and headaches.  Hematological: Negative for adenopathy. Does not bruise/bleed easily.  Psychiatric/Behavioral: Negative for confusion.    Blood pressure 100/70, pulse 84, temperature 98 F (36.7 C), temperature source Temporal, resp. rate 16, height 5\' 6"  (1.676 m), weight 210 lb 12.8 oz (95.618 kg).   Physical Exam Physical Exam  Constitutional: She is oriented to person, place, and time. She appears well-developed and well-nourished.  HENT:   Head: Normocephalic and atraumatic.   Nose: Nose normal.  Eyes: EOM are normal. Pupils are equal, round, and reactive to  light.  Neck: Normal range of motion. Neck supple.  Cardiovascular: Normal rate.  Exam reveals no friction rub.    No murmur heard. Pulmonary/Chest: Effort normal and breath sounds normal.       Left breast shows fullness in in the 12 to 3:00 position in the upper outer quadrant. This is mildly tender but not fluctuant. There is no nipple discharge. Left axilla normal. Right breast normal.  Musculoskeletal: Normal range of motion.  Neurological: She is alert and oriented to person, place, and time.  Skin: Skin is warm and dry.  Psychiatric: She has a normal mood and affect. Her behavior is normal. Judgment and thought content normal.    Data Reviewed Left breast ultrasound July 2012 and October 2012 reviewed   Persistent area left breast upper-outer quadrant 2 cm from the nipple between the 12 and 3:00 position consistent with chronic mastitis and abscess. Biopsy of this area done in July of 2012 negative for malignancy.   Assessment Chronic left breast mastitis and abscess   Plan   I discussed options with the patient of management. She has been managed medically since July of this year. This helps in the short-term but the process recurs despite drainage and antibiotics. I recommended excision of this area in her left breast to hopefully keep this from recurring. Risk of surgery include bleeding, infection, the need to leave the wound open, recurrence and the need for further surgery. Success rates for the surgery or around 75% at relieving the patient's symptoms. Alternative therapy is medical management and she has tried this. I will place her on doxycycline for the next 2 weeks prior to surgery to hopefully reduce or infection risk and wound complication rate that this is an issue which I discussed with her today. She would like to proceed.       D'Arcy Abraha A. 07/21/2011, 12:03 PM                Not recorded     Medications Ordered This Encounter         Disp  Refills Start End  doxycycline (VIBRA-TABS) 100 MG tablet 28 tablet 0 07/21/2011 08/04/2011    Take 1 tablet (100 mg total) by mouth 2 (two) times daily. - Oral             Patient Instructions     Mastitis    Mastitis is a bacterial infection of the breast tissue. CAUSES   Bacteria causes infection by entering the breast tissue through cuts or openings in the skin. Typically, this occurs with breastfeeding due to cracked or irritated skin. It can be associated with plugged ducts. Nipple piercing can also lead to mastitis. SYMPTOMS   In mastitis, an area of the breast becomes swollen, red, tender, and painful. You may notice you have a fever and swelling of the glands under your arm on that side. If the infection is allowed to progress, a collection of pus (abscess) may develop. DIAGNOSIS   Your caregiver can diagnose mastitis based on your symptoms and upon examination. The diagnosis can be confirmed if pus can be expressed from the breast. This pus can be examined in the lab to determine which bacteria are present. If an abscess has developed, the fluid in the abscess can be removed with a needle. This is used to confirm the diagnosis and determine the bacteria present. In most cases, pus will not be present. Blood tests can be done to determine if your body is fighting a bacterial infection. Sometimes, a mammogram or ultrasound will be recommended to exclude other breast diseases including cancer. Other rare forms of mastitis: Tuberculosis mastitis is rare. The TB germ can affect the breast if it is present in some other part of the body. The breast may be slightly tender with a mass, but not tender or painful.   Syphilis of the nipple usually has an ulcer that is not tender.   Actinomycosis is a very rare bacterial infection of the breast that presents as a mass in the breast that is not tender or painful.   Phlebitis (inflammation of blood vessels) of the breast is an inflammation of  the veins in the breast. It may be caused by tight fitting bras, surgery, or trauma to the breast.   Inflammatory carcinoma of the breast looks like mastitis because the breasts are red, swollen, or tender, but it is a rare form of breast cancer.  TREATMENT   Antibiotic medication is used to treat the bacterial infection. Your caregiver will determine which bacteria are most likely to be causing the infection and select an antibiotic. This is sometimes changed based on the results of cultures, or if there is no response to the antibiotic selected. Antibiotics are usually given by mouth. If you are breastfeeding, it is important to continue to empty the breast. Your caregiver can tell you whether or not this milk is safe for your infant, or needs to be thrown away. Pain can usually be treated with medication. HOME CARE INSTRUCTIONS   Take your antibiotics as directed. Finish them even if you start to feel better.   Only take over-the-counter or prescription medication for pain, discomfort, or fever as directed by your caregiver.   If breastfeeding, keep your nipples clean and dry. Your caregiver may tell you to stop nursing until he or she feels it is safe for your baby. Use a breast pump as instructed if forced to stop nursing.   Do not wear a tight bra. Wear a good support bra.   Empty the first breast completely before going to the other breast.  If your baby is not emptying your breasts completely for some reason, use a breast pump to empty your breasts.   If you go back to work, pump your breasts while at work to stay in time with your nursing schedule.   Increase your fluid intake especially if you have a fever.   Avoid having your breasts get overly filled with milk (engorged).  SEEK MEDICAL CARE IF:   You develop pus-like (purulent) discharge from the breast.   Your symptoms get worse.   You do not seem to be responding to your treatment within 2 days.  SEEK IMMEDIATE MEDICAL CARE IF:   You  have a fever.   Your pain and swelling is getting worse.   You develop pain that is not controlled with medicine.   You develop a red line extending from the breast toward your armpit.  Document Released: 09/18/2005 Document Revised: 05/31/2011 Document Reviewed: 05/08/2008 [facility] Patient Information 2012 [place], [place].       Level of Service     PR OFFICE CONSULTATION,LEVEL III [id number]      Follow-up and Disposition     Return if symptoms worsen or fail to improve.       Routing History Recorded        All Flowsheet Templates (all recorded)     Theatre stage manager Formula Data Flowsheet    Anthropometrics Flowsheet                   Letters     Letter Information         Status    Harriette Bouillon A on 07/21/2011 Sent             All Charges for This Encounter       Code Description Service Date Service Provider Modifiers Quantity    [id number] PR OFFICE CONSULTATION,LEVEL III 07/21/2011 Maisie Fus A. Stockton Nunley, MD   1      Routing History       Recipient Method User Date Routed To    Daryl Eastern, MD Fax Maisie Fus A. Ivania Teagarden, MD [[id number]] 07/21/2011 (none on file)    Fax: [phone] Phone: [phone] Letter: created on 07/21/2011 by Clovis Pu. Phelicia Dantes, MD              Other Encounter Related Information     Allergies & Medications         Problem List         History         Patient-Entered Questionnaires     No data filed

## 2011-08-06 MED ORDER — CEFAZOLIN SODIUM-DEXTROSE 2-3 GM-% IV SOLR
2.0000 g | INTRAVENOUS | Status: DC
  Filled 2011-08-06 (×2): qty 50

## 2011-08-07 ENCOUNTER — Encounter (HOSPITAL_COMMUNITY): Payer: Self-pay | Admitting: Critical Care Medicine

## 2011-08-07 ENCOUNTER — Ambulatory Visit (HOSPITAL_COMMUNITY)
Admission: RE | Admit: 2011-08-07 | Discharge: 2011-08-07 | Disposition: A | Discharge status: Home / Self Care | Payer: Self-pay | Source: Ambulatory Visit | Attending: Surgery | Admitting: Surgery

## 2011-08-07 ENCOUNTER — Encounter (HOSPITAL_COMMUNITY)
Admission: RE | Disposition: A | Discharge status: Home / Self Care | Payer: Self-pay | Source: Ambulatory Visit | Attending: Surgery

## 2011-08-07 ENCOUNTER — Other Ambulatory Visit (INDEPENDENT_AMBULATORY_CARE_PROVIDER_SITE_OTHER): Payer: Self-pay | Admitting: Surgery

## 2011-08-07 ENCOUNTER — Encounter (HOSPITAL_COMMUNITY): Payer: Self-pay | Admitting: Surgery

## 2011-08-07 ENCOUNTER — Ambulatory Visit (HOSPITAL_COMMUNITY): Payer: Self-pay | Admitting: Critical Care Medicine

## 2011-08-07 DIAGNOSIS — D249 Benign neoplasm of unspecified breast: Secondary | ICD-10-CM | POA: Insufficient documentation

## 2011-08-07 DIAGNOSIS — E119 Type 2 diabetes mellitus without complications: Secondary | ICD-10-CM | POA: Insufficient documentation

## 2011-08-07 DIAGNOSIS — N61 Mastitis without abscess: Secondary | ICD-10-CM | POA: Insufficient documentation

## 2011-08-07 DIAGNOSIS — N611 Abscess of the breast and nipple: Secondary | ICD-10-CM

## 2011-08-07 DIAGNOSIS — I10 Essential (primary) hypertension: Secondary | ICD-10-CM | POA: Insufficient documentation

## 2011-08-07 HISTORY — PX: BREAST BIOPSY: SHX20

## 2011-08-07 LAB — COMPREHENSIVE METABOLIC PANEL
ALT: 23 U/L (ref 0–35)
AST: 16 U/L (ref 0–37)
Albumin: 3.5 g/dL (ref 3.5–5.2)
CO2: 30 mEq/L (ref 19–32)
Calcium: 9.7 mg/dL (ref 8.4–10.5)
Chloride: 103 mEq/L (ref 96–112)
GFR calc non Af Amer: >90 mL/min (ref >90)
Sodium: 140 mEq/L (ref 135–145)
Total Bilirubin: 0.2 mg/dL — ABNORMAL LOW (ref 0.3–1.2)

## 2011-08-07 LAB — CBC
Platelets: 315 10*3/uL (ref 150–400)
RDW: 13.8 % (ref 11.5–15.5)
WBC: 11.9 10*3/uL — ABNORMAL HIGH (ref 4.0–10.5)

## 2011-08-07 LAB — DIFFERENTIAL
Basophils Absolute: 0 10*3/uL (ref 0.0–0.1)
Basophils Relative: 0 % (ref 0–1)
Lymphocytes Relative: 26 % (ref 12–46)
Neutro Abs: 7.6 10*3/uL (ref 1.7–7.7)

## 2011-08-07 SURGERY — BREAST BIOPSY
Anesthesia: General | Laterality: Left | Wound class: Clean

## 2011-08-07 MED ORDER — BUPIVACAINE-EPINEPHRINE 0.25% -1:200000 IJ SOLN
INTRAMUSCULAR | Status: DC | PRN
  Administered 2011-08-07: 30 mL

## 2011-08-07 MED ORDER — PHENYLEPHRINE HCL 10 MG/ML IJ SOLN
INTRAMUSCULAR | Status: DC | PRN
  Administered 2011-08-07: 40 ug via INTRAVENOUS
  Administered 2011-08-07: 80 ug via INTRAVENOUS

## 2011-08-07 MED ORDER — DOXYCYCLINE HYCLATE 100 MG PO TABS: 100.0000 mg | ORAL_TABLET | Freq: Two times a day (BID) | ORAL | Status: AC

## 2011-08-07 MED ORDER — FENTANYL CITRATE 0.05 MG/ML IJ SOLN
INTRAMUSCULAR | Status: DC | PRN
  Administered 2011-08-07: 150 ug via INTRAVENOUS
  Administered 2011-08-07 (×2): 25 ug via INTRAVENOUS

## 2011-08-07 MED ORDER — PROPOFOL 10 MG/ML IV EMUL
INTRAVENOUS | Status: DC | PRN
  Administered 2011-08-07: 200 mg via INTRAVENOUS

## 2011-08-07 MED ORDER — MIDAZOLAM HCL 5 MG/5ML IJ SOLN
INTRAMUSCULAR | Status: DC | PRN
  Administered 2011-08-07: 2 mg via INTRAVENOUS

## 2011-08-07 MED ORDER — CEFAZOLIN SODIUM 1-5 GM-% IV SOLN
INTRAVENOUS | Status: DC | PRN
  Administered 2011-08-07: 2 g via INTRAVENOUS

## 2011-08-07 MED ORDER — LACTATED RINGERS IV SOLN
INTRAVENOUS | Status: DC
  Administered 2011-08-07: 09:00:00 via INTRAVENOUS

## 2011-08-07 MED ORDER — LIDOCAINE HCL (CARDIAC) 20 MG/ML IV SOLN
INTRAVENOUS | Status: DC | PRN
  Administered 2011-08-07: 100 mg via INTRAVENOUS

## 2011-08-07 MED ORDER — OXYCODONE-ACETAMINOPHEN 10-325 MG PO TABS
1.0000 | ORAL_TABLET | Freq: Four times a day (QID) | ORAL | Status: AC | PRN
Start: 2011-08-07 — End: 2012-08-06

## 2011-08-07 MED ORDER — DROPERIDOL 2.5 MG/ML IJ SOLN: 0.6250 mg | INTRAMUSCULAR | Status: DC | PRN

## 2011-08-07 MED ORDER — LACTATED RINGERS IV SOLN
INTRAVENOUS | Status: DC | PRN
  Administered 2011-08-07 (×2): via INTRAVENOUS

## 2011-08-07 MED ORDER — HYDROMORPHONE HCL PF 1 MG/ML IJ SOLN
0.2500 mg | INTRAMUSCULAR | Status: DC | PRN
  Administered 2011-08-07 (×2): 0.25 mg via INTRAVENOUS

## 2011-08-07 MED ORDER — SUCCINYLCHOLINE CHLORIDE 20 MG/ML IJ SOLN
INTRAMUSCULAR | Status: DC | PRN
  Administered 2011-08-07: 100 mg via INTRAVENOUS

## 2011-08-07 MED ORDER — ONDANSETRON HCL 4 MG/2ML IJ SOLN
INTRAMUSCULAR | Status: DC | PRN
  Administered 2011-08-07: 4 mg via INTRAVENOUS

## 2011-08-07 SURGICAL SUPPLY — 46 items
BLADE SURG 10 STRL SS (BLADE) ×2 IMPLANT
BLADE SURG 15 STRL LF DISP TIS (BLADE) ×1 IMPLANT
BLADE SURG 15 STRL SS (BLADE) ×1
CANISTER SUCTION 2500CC (MISCELLANEOUS) ×2 IMPLANT
CHLORAPREP W/TINT 26ML (MISCELLANEOUS) ×2 IMPLANT
CLOTH BEACON ORANGE TIMEOUT ST (SAFETY) ×2 IMPLANT
COVER SURGICAL LIGHT HANDLE (MISCELLANEOUS) ×2 IMPLANT
DERMABOND ADVANCED (GAUZE/BANDAGES/DRESSINGS) ×1
DERMABOND ADVANCED .7 DNX12 (GAUZE/BANDAGES/DRESSINGS) ×1 IMPLANT
DRAIN PENROSE 1/4X12 LTX STRL (WOUND CARE) ×2 IMPLANT
DRAPE PED LAPAROTOMY (DRAPES) ×2 IMPLANT
DRAPE UTILITY 15X26 W/TAPE STR (DRAPE) ×4 IMPLANT
ELECT CAUTERY BLADE 6.4 (BLADE) ×2 IMPLANT
ELECT REM PT RETURN 9FT ADLT (ELECTROSURGICAL) ×2
ELECTRODE REM PT RTRN 9FT ADLT (ELECTROSURGICAL) ×1 IMPLANT
GAUZE SPONGE 4X4 12PLY STRL LF (GAUZE/BANDAGES/DRESSINGS) ×2 IMPLANT
GLOVE BIO SURGEON STRL SZ8 (GLOVE) ×2 IMPLANT
GLOVE BIOGEL PI IND STRL 7.0 (GLOVE) ×2 IMPLANT
GLOVE BIOGEL PI IND STRL 8 (GLOVE) ×1 IMPLANT
GLOVE BIOGEL PI INDICATOR 7.0 (GLOVE) ×2
GLOVE BIOGEL PI INDICATOR 8 (GLOVE) ×1
GLOVE ECLIPSE 6.5 STRL STRAW (GLOVE) ×4 IMPLANT
GOWN STRL NON-REIN LRG LVL3 (GOWN DISPOSABLE) ×4 IMPLANT
KIT BASIN OR (CUSTOM PROCEDURE TRAY) ×2 IMPLANT
KIT ROOM TURNOVER OR (KITS) ×2 IMPLANT
NEEDLE HYPO 25GX1X1/2 BEV (NEEDLE) ×2 IMPLANT
NS IRRIG 1000ML POUR BTL (IV SOLUTION) ×2 IMPLANT
PACK SURGICAL SETUP 50X90 (CUSTOM PROCEDURE TRAY) ×2 IMPLANT
PAD ARMBOARD 7.5X6 YLW CONV (MISCELLANEOUS) ×2 IMPLANT
PENCIL BUTTON HOLSTER BLD 10FT (ELECTRODE) ×2 IMPLANT
SPONGE LAP 18X18 X RAY DECT (DISPOSABLE) ×2 IMPLANT
SPONGE LAP 4X18 X RAY DECT (DISPOSABLE) ×2 IMPLANT
SUT ETHILON 2 0 FS 18 (SUTURE) ×2 IMPLANT
SUT MNCRL AB 4-0 PS2 18 (SUTURE) ×2 IMPLANT
SUT VIC AB 3-0 SH 27 (SUTURE) ×1
SUT VIC AB 3-0 SH 27X BRD (SUTURE) ×1 IMPLANT
SWAB CULTURE LIQUID MINI MALE (MISCELLANEOUS) ×2 IMPLANT
SYR BULB 3OZ (MISCELLANEOUS) ×2 IMPLANT
SYR CONTROL 10ML LL (SYRINGE) ×2 IMPLANT
TAPE CLOTH SURG 4X10 WHT LF (GAUZE/BANDAGES/DRESSINGS) ×2 IMPLANT
TOWEL OR 17X24 6PK STRL BLUE (TOWEL DISPOSABLE) ×2 IMPLANT
TOWEL OR 17X26 10 PK STRL BLUE (TOWEL DISPOSABLE) ×2 IMPLANT
TUBE ANAEROBIC SPECIMEN COL (MISCELLANEOUS) ×2 IMPLANT
TUBE CONNECTING 12X1/4 (SUCTIONS) ×2 IMPLANT
WATER STERILE IRR 1000ML POUR (IV SOLUTION) IMPLANT
YANKAUER SUCT BULB TIP NO VENT (SUCTIONS) ×2 IMPLANT

## 2011-08-07 NOTE — Anesthesia Procedure Notes (Signed)
Performed by: Elon Alas

## 2011-08-07 NOTE — Preoperative (Signed)
Beta Blockers   Reason not to administer Beta Blockers:Not Applicable 

## 2011-08-07 NOTE — Interval H&P Note (Signed)
History and Physical Interval Note:   08/07/2011   5:31 AM   Sheila Bishop  has presented today for surgery, with the diagnosis of LEFT BREAST MASTIFIS/MASS  The various methods of treatment have been discussed with the patient and family. After consideration of risks, benefits and other options for treatment, the patient has consented to  Procedure(s): BREAST BIOPSY as a surgical intervention .  The patients' history has been reviewed, patient examined, no change in status, stable for surgery.  I have reviewed the patients' chart and labs.  Questions were answered to the patient's satisfaction.     Cornell Bourbon A.  MD       No change to history and physical.

## 2011-08-07 NOTE — Anesthesia Preprocedure Evaluation (Addendum)
Anesthesia Evaluation  Patient identified by MRN, date of birth, ID band Patient awake    Reviewed: Allergy & Precautions, H&P , NPO status , Patient's Chart, lab work & pertinent test results, reviewed documented beta blocker date and time   History of Anesthesia Complications Negative for: history of anesthetic complications  Airway Mallampati: II TM Distance: >3 FB Neck ROM: Full    Dental  (+) Dental Advisory Given and Teeth Intact   Pulmonary neg pulmonary ROS,  clear to auscultation        Cardiovascular hypertension, Regular Normal- Systolic murmurs    Neuro/Psych  Headaches, PSYCHIATRIC DISORDERS    GI/Hepatic   Endo/Other  Diabetes mellitus-  Renal/GU      Musculoskeletal   Abdominal   Peds  Hematology   Anesthesia Other Findings   Reproductive/Obstetrics                          Anesthesia Physical Anesthesia Plan  ASA: III  Anesthesia Plan: General   Post-op Pain Management:    Induction: Intravenous  Airway Management Planned: LMA and Oral ETT  Additional Equipment:   Intra-op Plan:   Post-operative Plan: Extubation in OR  Informed Consent: I have reviewed the patients History and Physical, chart, labs and discussed the procedure including the risks, benefits and alternatives for the proposed anesthesia with the patient or authorized representative who has indicated his/her understanding and acceptance.   Dental advisory given  Plan Discussed with:   Anesthesia Plan Comments:         Anesthesia Quick Evaluation

## 2011-08-07 NOTE — Interval H&P Note (Signed)
History and Physical Interval Note:   08/07/2011   9:22 AM   Sheila Bishop  has presented today for surgery, with the diagnosis of LEFT BREAST MASTIFIS/MASS  The various methods of treatment have been discussed with the patient and family. After consideration of risks, benefits and other options for treatment, the patient has consented to  Procedure(s): BREAST BIOPSY as a surgical intervention .  The patients' history has been reviewed, patient examined, no change in status, stable for surgery.  I have reviewed the patients' chart and labs.  Questions were answered to the patient's satisfaction.     Avram Danielson A.  MD      Pt seen and reexamined.  No change to H AND P

## 2011-08-07 NOTE — Transfer of Care (Signed)
Immediate Anesthesia Transfer of Care Note  Patient: Sheila Bishop  Procedure(s) Performed:  BREAST BIOPSY - BIOPSY BREAST LEFT SIDE  Patient Location: PACU  Anesthesia Type: General  Level of Consciousness: awake, alert , oriented and patient cooperative  Airway & Oxygen Therapy: Patient Spontanous Breathing and Patient connected to nasal cannula oxygen  Post-op Assessment: Report given to PACU RN, Post -op Vital signs reviewed and stable and Patient moving all extremities X 4  Post vital signs: Reviewed and stable  Complications: No apparent anesthesia complications

## 2011-08-07 NOTE — Op Note (Signed)
Preop diagnosis: Left breast mass  Postop diagnosis: Left breast mass with chronic abscess cavity  Procedure: Left breast lumpectomy with placement of Penrose drain  Surgeon: Harriette Bouillon M.D.  Anesthesia: Gen. with local  Specimen: Left breast mass and cultures  Drains: One quarter inch Penrose  Indications for procedure: The patient is a 40 year old female who has had repeated episodes of left breast mastitis and abscess. This has been managed with antibiotics and percutaneous drainage on multiple occasions. The abscess continues to recur. She has completed a round of antibiotics and presents to the operating room for definitive therapy. Risks of procedure were discussed with the patient as well as benefits and expected recovery period these are outlined in my history and physical. Alternative therapy discussed with the patient as well which should be continued medical management. She understood the above and wish to proceed with surgical treatment.  Description of procedure: The patient was seen in the holding area. The left breast was marked. Questions are answered. The patient was taken to the operating room and placed supine. The upper chest was prepped and draped in a sterile fashion. A timeout was done. She received 1 g of Ancef. The area in her left breast where the recurring abscess was located was in the upper outer quadrant. A curvilinear incision was made 2 cm from the edge the nipple in the upper outer quadrant. There is a 3 cm mass which was cystic encountered. This area was excised and there was some cloudy fluid within the mass. This was sent for culture. The mass was sent to pathology. Hemostasis was achieved. A single 2-0 Vicryl stitch was placed in the deep breast tissue. A quarter-inch Penrose drain was placed underneath this and secured to the skin on both sides of the incision with 2-0 nylon. I approximated the skin with 4-0 Monocryl. Antibiotic ointment was applied to the  wound. Dry dressings were applied. All final counts the sponge, needle and instrument were correct. The patient was taken to recovery in satisfactory condition.

## 2011-08-07 NOTE — Anesthesia Postprocedure Evaluation (Signed)
Anesthesia Post Note  Patient: Sheila Bishop  Procedure(s) Performed:  BREAST BIOPSY - BIOPSY BREAST LEFT SIDE  Anesthesia type: General  Patient location: PACU  Post pain: Pain level controlled and Adequate analgesia  Post assessment: Post-op Vital signs reviewed, Patient's Cardiovascular Status Stable, Respiratory Function Stable, Patent Airway and Pain level controlled  Last Vitals:  Filed Vitals:   08/07/11 1145  BP: 138/89  Pulse: 87  Temp:   Resp: 15    Post vital signs: Reviewed and stable  Level of consciousness: awake, alert  and oriented  Complications: No apparent anesthesia complications

## 2011-08-10 LAB — CULTURE, ROUTINE-ABSCESS: Culture: NO GROWTH

## 2011-08-11 ENCOUNTER — Encounter (HOSPITAL_COMMUNITY): Payer: Self-pay | Admitting: Surgery

## 2011-08-12 LAB — ANAEROBIC CULTURE

## 2011-08-14 ENCOUNTER — Ambulatory Visit (INDEPENDENT_AMBULATORY_CARE_PROVIDER_SITE_OTHER): Payer: PRIVATE HEALTH INSURANCE | Admitting: Surgery

## 2011-08-14 ENCOUNTER — Encounter (INDEPENDENT_AMBULATORY_CARE_PROVIDER_SITE_OTHER): Payer: Self-pay | Admitting: Surgery

## 2011-08-14 VITALS — BP 120/72 | HR 84 | Temp 97.6°F | Resp 16 | Ht 66.0 in | Wt 223.0 lb

## 2011-08-14 DIAGNOSIS — Z9889 Other specified postprocedural states: Secondary | ICD-10-CM

## 2011-08-14 NOTE — Patient Instructions (Addendum)
Keep dry dressing on incision.  Return to  Work in 1 week.  Return to office in two weeks.  Finnish antibiotics.

## 2011-08-14 NOTE — Progress Notes (Signed)
Patient returns to clinic today. She underwent a debridement of a left breast mass at about a chronic abscess which was sterile by culture. Pathology showed chronic inflammatory change the tissue. She had a Penrose drain in place.  Exam: Left breast tenderness drain removed. Her dressing applied. The drainage is serosanguineous. There is no significant redness.  Impression: Chronic left breast abscess status post incision and drainage with removal of perose drain. Plan: Return to work one week. Complete doxycycline. Return to clinic 2 weeks.

## 2011-08-28 ENCOUNTER — Encounter (INDEPENDENT_AMBULATORY_CARE_PROVIDER_SITE_OTHER): Payer: PRIVATE HEALTH INSURANCE | Admitting: Surgery

## 2011-09-21 ENCOUNTER — Encounter (INDEPENDENT_AMBULATORY_CARE_PROVIDER_SITE_OTHER): Payer: PRIVATE HEALTH INSURANCE | Admitting: Surgery

## 2011-09-28 ENCOUNTER — Encounter (INDEPENDENT_AMBULATORY_CARE_PROVIDER_SITE_OTHER): Payer: Self-pay | Admitting: Surgery

## 2013-09-16 ENCOUNTER — Encounter (HOSPITAL_COMMUNITY): Payer: Self-pay | Admitting: Emergency Medicine

## 2013-09-16 ENCOUNTER — Emergency Department (HOSPITAL_COMMUNITY)
Admission: EM | Admit: 2013-09-16 | Discharge: 2013-09-17 | Disposition: A | Discharge status: Home / Self Care | Payer: Self-pay | Attending: Emergency Medicine | Admitting: Emergency Medicine

## 2013-09-16 DIAGNOSIS — E119 Type 2 diabetes mellitus without complications: Secondary | ICD-10-CM | POA: Insufficient documentation

## 2013-09-16 DIAGNOSIS — Z87891 Personal history of nicotine dependence: Secondary | ICD-10-CM | POA: Insufficient documentation

## 2013-09-16 DIAGNOSIS — A5901 Trichomonal vulvovaginitis: Secondary | ICD-10-CM | POA: Insufficient documentation

## 2013-09-16 DIAGNOSIS — Z202 Contact with and (suspected) exposure to infections with a predominantly sexual mode of transmission: Secondary | ICD-10-CM | POA: Insufficient documentation

## 2013-09-16 DIAGNOSIS — Z3202 Encounter for pregnancy test, result negative: Secondary | ICD-10-CM | POA: Insufficient documentation

## 2013-09-16 DIAGNOSIS — I10 Essential (primary) hypertension: Secondary | ICD-10-CM | POA: Insufficient documentation

## 2013-09-16 DIAGNOSIS — Z872 Personal history of diseases of the skin and subcutaneous tissue: Secondary | ICD-10-CM | POA: Insufficient documentation

## 2013-09-16 DIAGNOSIS — Z79899 Other long term (current) drug therapy: Secondary | ICD-10-CM | POA: Insufficient documentation

## 2013-09-16 DIAGNOSIS — Z8659 Personal history of other mental and behavioral disorders: Secondary | ICD-10-CM | POA: Insufficient documentation

## 2013-09-16 NOTE — ED Notes (Signed)
Inc. Vagina irritation r/t a soap that she normally doesn't use. No abnormal vagina d/c, no odor.

## 2013-09-17 LAB — URINALYSIS, ROUTINE W REFLEX MICROSCOPIC
Bilirubin Urine: NEGATIVE
Glucose, UA: >1000 mg/dL — AB
Ketones, ur: NEGATIVE mg/dL
Nitrite: NEGATIVE
Protein, ur: NEGATIVE mg/dL
Specific Gravity, Urine: 1.022 (ref 1.005–1.030)
Urobilinogen, UA: 1 mg/dL (ref 0.0–1.0)
pH: 6 (ref 5.0–8.0)

## 2013-09-17 LAB — WET PREP, GENITAL: Yeast Wet Prep HPF POC: NONE SEEN

## 2013-09-17 LAB — URINE MICROSCOPIC-ADD ON

## 2013-09-17 MED ORDER — AZITHROMYCIN 250 MG PO TABS
1000.0000 mg | ORAL_TABLET | Freq: Once | ORAL | Status: AC
  Administered 2013-09-17: 1000 mg via ORAL
  Filled 2013-09-17: qty 4

## 2013-09-17 MED ORDER — CEFTRIAXONE SODIUM 250 MG IJ SOLR
250.0000 mg | Freq: Once | INTRAMUSCULAR | Status: AC
  Administered 2013-09-17: 250 mg via INTRAMUSCULAR
  Filled 2013-09-17: qty 250

## 2013-09-17 MED ORDER — LIDOCAINE HCL (PF) 1 % IJ SOLN
INTRAMUSCULAR | Status: AC
  Administered 2013-09-17: 5 mL
  Filled 2013-09-17: qty 5

## 2013-09-17 MED ORDER — METRONIDAZOLE 500 MG PO TABS
2000.0000 mg | ORAL_TABLET | Freq: Once | ORAL | Status: AC
  Administered 2013-09-17: 2000 mg via ORAL
  Filled 2013-09-17: qty 4

## 2013-09-17 NOTE — ED Notes (Signed)
Pt A&OX4, ambulatory upon discharge.

## 2013-09-17 NOTE — ED Provider Notes (Signed)
Medical screening examination/treatment/procedure(s) were performed by non-physician practitioner and as supervising physician I was immediately available for consultation/collaboration.   Sunnie Nielsen, MD 09/17/13 [phone]

## 2013-09-17 NOTE — ED Provider Notes (Signed)
CSN: [id number]     Arrival date & time 09/16/13  2031 History   First MD Initiated Contact with Patient 09/16/13 2326     Chief Complaint  Patient presents with  . Vaginitis   (Consider location/radiation/quality/duration/timing/severity/associated sxs/prior Treatment) HPI Pt is a 42yo female c/o 5 day hx of vaginal irritation and burning.  Pain is constant, 9/10, worse with movement and urination.  Pt reports pain started after using a new soap with lavender and camomile. Since pain started pt has tried using plain soap and just water but no help with irritation. Pt also states she does have some concern of STD. Denies fever, n/v/d. Pt is sexually active, not on birth control, menstrual cycle started today.    Past Medical History  Diagnosis Date  . Breast abscess     left  . Diabetes mellitus   . Hypertension   . Headache(784.0)   . Depression   . Wears glasses    Past Surgical History  Procedure Laterality Date  . Breast biopsy  08/07/2011    Procedure: BREAST BIOPSY;  Surgeon: Clovis Pu. Cornett, MD;  Location: [facility];  Service: General;  Laterality: Left;  BIOPSY BREAST LEFT SIDE  . Cesarean section  2002   History reviewed. No pertinent family history. History  Substance Use Topics  . Smoking status: Former Smoker    Quit date: 12/31/2007  . Smokeless tobacco: Not on file  . Alcohol Use: No   OB History   Grav Para Term Preterm Abortions TAB SAB Ect Mult Living                 Review of Systems  Constitutional: Negative for fever and chills.  Gastrointestinal: Negative for nausea, vomiting, abdominal pain and diarrhea.  Genitourinary: Positive for vaginal bleeding ( started menstration today) and vaginal pain. Negative for dysuria, urgency, hematuria, flank pain, vaginal discharge, genital sores, menstrual problem and pelvic pain.  All other systems reviewed and are negative.    Allergies  Review of patient's allergies indicates no known allergies.  Home  Medications   Current Outpatient Rx  Name  Route  Sig  Dispense  Refill  . glipiZIDE (GLUCOTROL) 10 MG tablet   Oral   Take 10 mg by mouth 2 (two) times daily before a meal.           . lisinopril (PRINIVIL,ZESTRIL) 20 MG tablet   Oral   Take 20 mg by mouth daily.            BP 138/92  Pulse 112  Temp(Src) 98.1 F (36.7 C) (Oral)  Resp 16  SpO2 98%  LMP 08/20/2013 Physical Exam  Nursing note and vitals reviewed. Constitutional: She appears well-developed and well-nourished. No distress.  HENT:  Head: Normocephalic and atraumatic.  Eyes: Conjunctivae are normal. No scleral icterus.  Neck: Normal range of motion.  Cardiovascular: Normal rate, regular rhythm and normal heart sounds.   Pulmonary/Chest: Effort normal and breath sounds normal. No respiratory distress. She has no wheezes. She has no rales. She exhibits no tenderness.  Abdominal: Soft. Bowel sounds are normal. She exhibits no distension and no mass. There is no tenderness. There is no rebound and no guarding.  Genitourinary: Uterus normal. No labial fusion. There is no rash, tenderness, lesion or injury on the right labia. There is no rash, tenderness, lesion or injury on the left labia. Cervix exhibits no motion tenderness, no discharge and no friability. Right adnexum displays no mass, no tenderness and no fullness.  Left adnexum displays no mass, no tenderness and no fullness. There is erythema and bleeding ( scant bright and dark red blood) around the vagina. No tenderness around the vagina. No foreign body around the vagina. Vaginal discharge ( scant white discharge) found.  scant white discharge with blood.  Moderate tenderness along vaginal opening and right vaginal wall. Erythema w/o red streaking. No lesions or masses.  No CMT, adnexal tenderness or masses.  Musculoskeletal: Normal range of motion.  Neurological: She is alert.  Skin: Skin is warm and dry. She is not diaphoretic.    ED Course  Procedures  (including critical care time) Labs Review Labs Reviewed  WET PREP, GENITAL - Abnormal; Notable for the following:    Trich, Wet Prep FEW (*)    Clue Cells Wet Prep HPF POC FEW (*)    WBC, Wet Prep HPF POC FEW (*)    All other components within normal limits  URINALYSIS, ROUTINE W REFLEX MICROSCOPIC - Abnormal; Notable for the following:    APPearance CLOUDY (*)    Glucose, UA >1000 (*)    Hgb urine dipstick LARGE (*)    Leukocytes, UA SMALL (*)    All other components within normal limits  URINE MICROSCOPIC-ADD ON - Abnormal; Notable for the following:    Squamous Epithelial / LPF MANY (*)    Bacteria, UA FEW (*)    All other components within normal limits  GC/CHLAMYDIA PROBE AMP  URINE CULTURE  POCT PREGNANCY, URINE   Imaging Review No results found.  EKG Interpretation   None       MDM   1. Trichomonas vaginitis   2. Exposure to STD    Pt with vaginal irritation after soap. Concern for STD.  Pt appears well, non-toxic. No abdominal pain.  Urine preg: negative UA: likely contaminated, will send for culture.  Pelvic exam: vaginal erythema and vaginal wall tenderness. No CMT, adnexal tenderness or masses. No lesions.  Wet prep: few trich and clue cells.  Will tx for trichamonas as well as empirically for GC/chlamydia with 1x tx in ED.  [facility].  Strongly encouraged further STD testing and treatment of her partners at [facility]. Pt verbalized understanding and agreement with tx plan.      Junius Finner, PA-C 09/17/13 [phone]

## 2013-09-18 LAB — URINE CULTURE: Colony Count: >=100000

## 2013-09-20 LAB — GC/CHLAMYDIA PROBE AMP
CT Probe RNA: NEGATIVE
GC Probe RNA: NEGATIVE

## 2013-10-22 ENCOUNTER — Emergency Department (HOSPITAL_COMMUNITY)
Admission: EM | Admit: 2013-10-22 | Discharge: 2013-10-22 | Disposition: A | Discharge status: Home / Self Care | Payer: Self-pay | Source: Home / Self Care | Attending: Family Medicine | Admitting: Family Medicine

## 2013-10-22 ENCOUNTER — Encounter (HOSPITAL_COMMUNITY): Payer: Self-pay | Admitting: Emergency Medicine

## 2013-10-22 DIAGNOSIS — I10 Essential (primary) hypertension: Secondary | ICD-10-CM

## 2013-10-22 DIAGNOSIS — R7309 Other abnormal glucose: Secondary | ICD-10-CM

## 2013-10-22 DIAGNOSIS — R739 Hyperglycemia, unspecified: Secondary | ICD-10-CM

## 2013-10-22 DIAGNOSIS — R81 Glycosuria: Secondary | ICD-10-CM

## 2013-10-22 DIAGNOSIS — R300 Dysuria: Secondary | ICD-10-CM

## 2013-10-22 LAB — POCT URINALYSIS DIP (DEVICE)
Bilirubin Urine: NEGATIVE
Glucose, UA: 500 mg/dL — AB
Hgb urine dipstick: NEGATIVE
Ketones, ur: NEGATIVE mg/dL
Leukocytes, UA: NEGATIVE
Nitrite: NEGATIVE
Protein, ur: 30 mg/dL — AB
Specific Gravity, Urine: 1.02 (ref 1.005–1.030)
Urobilinogen, UA: 0.2 mg/dL (ref 0.0–1.0)
pH: 7.5 (ref 5.0–8.0)

## 2013-10-22 LAB — POCT PREGNANCY, URINE: PREG TEST UR: NEGATIVE

## 2013-10-22 LAB — GLUCOSE, CAPILLARY: Glucose-Capillary: 235 mg/dL — ABNORMAL HIGH (ref 70–99)

## 2013-10-22 MED ORDER — LISINOPRIL 20 MG PO TABS: 20.0000 mg | ORAL_TABLET | Freq: Every day | ORAL | Status: DC

## 2013-10-22 MED ORDER — GLIPIZIDE 10 MG PO TABS: 10.0000 mg | ORAL_TABLET | Freq: Every day | ORAL | Status: DC

## 2013-10-22 NOTE — ED Notes (Addendum)
pT  REPORTS   PAIN ON  URINATION AS  WELL  AS  FREQUENCY     WITH  THE  SYMPTOMS   X  1  DAY   -  PT  ALSO  REPORTS  VAGINAL IRRITATION AS  WELL   DENYS  ANY  BLEEDING    pT  ALSO  REPORTS     SHE  IS  OIT OF HER  LISINOPRIL  AND  UNABLE  TO SEE  PROVIDER FOR ANOTHER  MONTH  ALSO  IS  OUT OF  HER  DIABETIC  MED

## 2013-10-22 NOTE — ED Provider Notes (Signed)
Medical screening examination/treatment/procedure(s) were performed by a resident physician or non-physician practitioner and as the supervising physician I was immediately available for consultation/collaboration.  Lynne Leader, MD   Gregor Hams, MD 10/22/13 [phone]

## 2013-10-22 NOTE — ED Provider Notes (Signed)
CSN: [id number]     Arrival date & time 10/22/13  5643 History   First MD Initiated Contact with Patient 10/22/13 [phone]     Chief Complaint  Patient presents with  . Dysuria   (Consider location/radiation/quality/duration/timing/severity/associated sxs/prior Treatment) HPI Comments: Also requests that we refill her diabetic and anti-HTN medications as she has let her medicaid card expire and cannot be seen by her PCP ([facility]) until 11/24/2013 @ 8:30am.   Patient is a 43 y.o. female presenting with dysuria. The history is provided by the patient.  Dysuria Pain quality:  Burning Pain severity:  Mild Onset quality:  Gradual Duration:  1 day Timing:  Intermittent Chronicity:  New Recent urinary tract infections: no   Urinary symptoms: no discolored urine, no foul-smelling urine, no frequent urination, no hematuria, no hesitancy and no bladder incontinence   Associated symptoms: no abdominal pain, no fever, no flank pain, no genital lesions, no nausea, no vaginal discharge and no vomiting     Past Medical History  Diagnosis Date  . Breast abscess     left  . Diabetes mellitus   . Hypertension   . Headache(784.0)   . Depression   . Wears glasses    Past Surgical History  Procedure Laterality Date  . Breast biopsy  08/07/2011    Procedure: BREAST BIOPSY;  Surgeon: Joyice Faster. Cornett, MD;  Location: [facility];  Service: General;  Laterality: Left;  BIOPSY BREAST LEFT SIDE  . Cesarean section  2002   History reviewed. No pertinent family history. History  Substance Use Topics  . Smoking status: Former Smoker    Quit date: 12/31/2007  . Smokeless tobacco: Not on file  . Alcohol Use: No   OB History   Grav Para Term Preterm Abortions TAB SAB Ect Mult Living                 Review of Systems  Constitutional: Negative for fever.  Gastrointestinal: Negative for nausea, vomiting and abdominal pain.  Genitourinary: Positive for dysuria. Negative for flank pain and  vaginal discharge.  All other systems reviewed and are negative.    Allergies  Review of patient's allergies indicates no known allergies.  Home Medications   Current Outpatient Rx  Name  Route  Sig  Dispense  Refill  . glipiZIDE (GLUCOTROL) 10 MG tablet   Oral   Take 10 mg by mouth 2 (two) times daily before a meal.           . lisinopril (PRINIVIL,ZESTRIL) 20 MG tablet   Oral   Take 20 mg by mouth daily.            BP 146/94  Pulse 80  Temp(Src) 98.3 F (36.8 C) (Oral)  Resp 18  SpO2 100%  LMP 10/13/2013 Physical Exam  Nursing note and vitals reviewed. Constitutional: She is oriented to person, place, and time. She appears well-developed and well-nourished. No distress.  HENT:  Head: Normocephalic and atraumatic.  Eyes: Conjunctivae are normal. No scleral icterus.  Cardiovascular: Normal rate, regular rhythm and normal heart sounds.   Pulmonary/Chest: Effort normal and breath sounds normal.  Abdominal: Soft. Bowel sounds are normal. She exhibits no distension. There is no tenderness. There is no CVA tenderness.  Genitourinary: Vagina normal. Pelvic exam was performed with patient supine. There is no rash, tenderness or lesion on the right labia. There is no rash, tenderness or lesion on the left labia. No erythema or tenderness around the vagina. No vaginal discharge found.  Musculoskeletal: Normal range of motion.  Neurological: She is alert and oriented to person, place, and time.  Skin: Skin is warm and dry. No rash noted.  Psychiatric: She has a normal mood and affect. Her behavior is normal.    ED Course  Procedures (including critical care time) Labs Review Labs Reviewed  POCT URINALYSIS DIP (DEVICE) - Abnormal; Notable for the following:    Glucose, UA 500 (*)    Protein, ur 30 (*)    All other components within normal limits  POCT PREGNANCY, URINE   Imaging Review No results found.  EKG Interpretation    Date/Time:    Ventricular Rate:    PR  Interval:    QRS Duration:   QT Interval:    QTC Calculation:   R Axis:     Text Interpretation:              MDM  UA normal. No indication of vaginal yeast infection. Likely vaginal irritation and dysuria from glucosuria. Will provide 1 month Rx for her glipizide and lisinopril and told to keep Feb 25th appt with her PCP for follow up.     [place], [place] 10/22/13 1023

## 2013-10-23 LAB — URINE CULTURE
Colony Count: 10000
SPECIAL REQUESTS: NORMAL

## 2013-12-19 ENCOUNTER — Encounter (HOSPITAL_COMMUNITY): Payer: Self-pay | Admitting: Emergency Medicine

## 2013-12-19 ENCOUNTER — Emergency Department (HOSPITAL_COMMUNITY)
Admission: EM | Admit: 2013-12-19 | Discharge: 2013-12-19 | Disposition: A | Discharge status: Home / Self Care | Payer: Self-pay | Attending: Emergency Medicine | Admitting: Emergency Medicine

## 2013-12-19 DIAGNOSIS — Z79899 Other long term (current) drug therapy: Secondary | ICD-10-CM | POA: Insufficient documentation

## 2013-12-19 DIAGNOSIS — Z3202 Encounter for pregnancy test, result negative: Secondary | ICD-10-CM | POA: Insufficient documentation

## 2013-12-19 DIAGNOSIS — Z8659 Personal history of other mental and behavioral disorders: Secondary | ICD-10-CM | POA: Insufficient documentation

## 2013-12-19 DIAGNOSIS — R42 Dizziness and giddiness: Secondary | ICD-10-CM | POA: Insufficient documentation

## 2013-12-19 DIAGNOSIS — N938 Other specified abnormal uterine and vaginal bleeding: Secondary | ICD-10-CM | POA: Insufficient documentation

## 2013-12-19 DIAGNOSIS — N949 Unspecified condition associated with female genital organs and menstrual cycle: Secondary | ICD-10-CM | POA: Insufficient documentation

## 2013-12-19 DIAGNOSIS — I10 Essential (primary) hypertension: Secondary | ICD-10-CM | POA: Insufficient documentation

## 2013-12-19 DIAGNOSIS — E119 Type 2 diabetes mellitus without complications: Secondary | ICD-10-CM | POA: Insufficient documentation

## 2013-12-19 DIAGNOSIS — Z87891 Personal history of nicotine dependence: Secondary | ICD-10-CM | POA: Insufficient documentation

## 2013-12-19 LAB — URINE MICROSCOPIC-ADD ON

## 2013-12-19 LAB — URINALYSIS, ROUTINE W REFLEX MICROSCOPIC
Bilirubin Urine: NEGATIVE
Glucose, UA: NEGATIVE mg/dL
Ketones, ur: 15 mg/dL — AB
Leukocytes, UA: NEGATIVE
NITRITE: NEGATIVE
PROTEIN: 100 mg/dL — AB
SPECIFIC GRAVITY, URINE: 1.02 (ref 1.005–1.030)
UROBILINOGEN UA: 0.2 mg/dL (ref 0.0–1.0)
pH: 5 (ref 5.0–8.0)

## 2013-12-19 LAB — CBC
HCT: 37.5 % (ref 36.0–46.0)
Hemoglobin: 13.3 g/dL (ref 12.0–15.0)
MCH: 27.8 pg (ref 26.0–34.0)
MCHC: 35.5 g/dL (ref 30.0–36.0)
MCV: 78.3 fL (ref 78.0–100.0)
PLATELETS: 354 10*3/uL (ref 150–400)
RBC: 4.79 MIL/uL (ref 3.87–5.11)
RDW: 12.9 % (ref 11.5–15.5)
WBC: 15 10*3/uL — AB (ref 4.0–10.5)

## 2013-12-19 LAB — POC URINE PREG, ED: PREG TEST UR: NEGATIVE

## 2013-12-19 NOTE — ED Notes (Signed)
Pt discharged home with all belongings, pt alert and ambulatory upon discharge, no new RX given, pt verbalizes understanding of d/c instructions, pt drove self home, no narcotics given in ED

## 2013-12-19 NOTE — ED Notes (Signed)
Pt reports she had her normal period 3/2-3/6. This week on Wednesday she woke and noticed she had started bleeding again and last night the bleeding got "very heavy with clots." she states shes changing her pads every 30 minutes and having "strong cramps." a&ox4, breathing easily

## 2013-12-19 NOTE — Discharge Instructions (Signed)

## 2013-12-19 NOTE — ED Notes (Signed)
EDP Pickering at bedside to complete the pelvic exam and explain further plan of care

## 2013-12-19 NOTE — ED Notes (Addendum)
Pt presents with heavy vaginal bleeding associated with large blood clots starting Wednesday, pt states she had a normal menstrual cycle March 2-6. Pt also reports lower abd cramping. Pt states she had a tubal ligation in 2002. Pt states she is using 2-3 pads every hour

## 2013-12-19 NOTE — ED Provider Notes (Signed)
CSN: [id number]     Arrival date & time 12/19/13  9937 History   First MD Initiated Contact with Patient 12/19/13 0919     Chief Complaint  Patient presents with  . Vaginal Bleeding     (Consider location/radiation/quality/duration/timing/severity/associated sxs/prior Treatment) Patient is a 43 y.o. female presenting with vaginal bleeding. The history is provided by the patient.  Vaginal Bleeding Quality:  Clots and heavier than menses Associated symptoms: no abdominal pain, no back pain, no fatigue and no nausea    patient states that she had a normal period on the third through the sixth of this month. He was of normal duration and at its normal time. She states that on the 16th she started to have more bleeding. She states she is now passing large clots. She states she's been using a pad every half hour. She states she feels little lightheaded and was told she looks pale. She does not have a history of heavy bleeding. No history of anemia. Patient does not think she is pregnant, but states it is because she had period this month already. No other bleeding.  Past Medical History  Diagnosis Date  . Breast abscess     left  . Diabetes mellitus   . Hypertension   . Headache(784.0)   . Depression   . Wears glasses    Past Surgical History  Procedure Laterality Date  . Breast biopsy  08/07/2011    Procedure: BREAST BIOPSY;  Surgeon: Joyice Faster. Cornett, MD;  Location: [facility];  Service: General;  Laterality: Left;  BIOPSY BREAST LEFT SIDE  . Cesarean section  2002   History reviewed. No pertinent family history. History  Substance Use Topics  . Smoking status: Former Smoker    Quit date: 12/31/2007  . Smokeless tobacco: Not on file  . Alcohol Use: No   OB History   Grav Para Term Preterm Abortions TAB SAB Ect Mult Living                 Review of Systems  Constitutional: Negative for activity change, appetite change and fatigue.  Eyes: Negative for pain.  Respiratory: Negative  for chest tightness and shortness of breath.   Cardiovascular: Negative for chest pain and leg swelling.  Gastrointestinal: Negative for nausea, vomiting, abdominal pain and diarrhea.  Genitourinary: Positive for vaginal bleeding. Negative for flank pain.  Musculoskeletal: Negative for back pain and neck stiffness.  Skin: Negative for rash.  Neurological: Positive for light-headedness. Negative for weakness, numbness and headaches.  Hematological: Does not bruise/bleed easily.  Psychiatric/Behavioral: Negative for behavioral problems.      Allergies  Review of patient's allergies indicates no known allergies.  Home Medications   Current Outpatient Rx  Name  Route  Sig  Dispense  Refill  . glipiZIDE (GLUCOTROL) 10 MG tablet   Oral   Take 10 mg by mouth 2 (two) times daily before a meal.           . lisinopril (PRINIVIL,ZESTRIL) 20 MG tablet   Oral   Take 20 mg by mouth daily.            BP 170/106  Pulse 83  Temp(Src) 97.8 F (36.6 C) (Oral)  Resp 18  SpO2 96% Physical Exam  Constitutional: She is oriented to person, place, and time. She appears well-developed and well-nourished.  HENT:  Head: Normocephalic.  Cardiovascular: Normal rate and regular rhythm.   Pulmonary/Chest: Effort normal.  Abdominal: Soft.  Musculoskeletal: Normal range of motion.  Neurological: She is alert and oriented to person, place, and time.  Skin: Skin is warm. No pallor.    ED Course  Procedures (including critical care time) Labs Review Labs Reviewed  CBC - Abnormal; Notable for the following:    WBC 15.0 (*)    All other components within normal limits  URINALYSIS, ROUTINE W REFLEX MICROSCOPIC - Abnormal; Notable for the following:    APPearance CLOUDY (*)    Hgb urine dipstick LARGE (*)    Ketones, ur 15 (*)    Protein, ur 100 (*)    All other components within normal limits  URINE MICROSCOPIC-ADD ON - Abnormal; Notable for the following:    Bacteria, UA FEW (*)    All  other components within normal limits  POC URINE PREG, ED   Imaging Review No results found.   EKG Interpretation None      MDM   Final diagnoses:  Dysfunctional uterine bleeding    Patient with vaginal bleeding. Has been having for the last few days. There is some mild bleeding on pelvic exam. Present small cervical mass will need to be followed. I discussed with the gynecologist on call, who will see her in followup in a month.    Jasper Riling. Alvino Chapel, MD 12/19/13 1323

## 2013-12-31 ENCOUNTER — Encounter: Payer: Self-pay | Admitting: Obstetrics & Gynecology

## 2014-01-19 ENCOUNTER — Encounter: Payer: Self-pay | Admitting: Obstetrics & Gynecology

## 2014-01-19 ENCOUNTER — Ambulatory Visit (INDEPENDENT_AMBULATORY_CARE_PROVIDER_SITE_OTHER): Payer: Self-pay | Admitting: Obstetrics & Gynecology

## 2014-01-19 VITALS — BP 126/88 | HR 90 | Ht 66.0 in | Wt 206.6 lb

## 2014-01-19 DIAGNOSIS — N938 Other specified abnormal uterine and vaginal bleeding: Secondary | ICD-10-CM

## 2014-01-19 DIAGNOSIS — Z0000 Encounter for general adult medical examination without abnormal findings: Secondary | ICD-10-CM

## 2014-01-19 DIAGNOSIS — N949 Unspecified condition associated with female genital organs and menstrual cycle: Secondary | ICD-10-CM

## 2014-01-19 DIAGNOSIS — N920 Excessive and frequent menstruation with regular cycle: Secondary | ICD-10-CM

## 2014-01-19 LAB — TSH: TSH: 0.883 u[IU]/mL (ref 0.350–4.500)

## 2014-01-19 LAB — POCT PREGNANCY, URINE: Preg Test, Ur: NEGATIVE

## 2014-01-19 NOTE — Progress Notes (Signed)
Patient reports that in March she had a period on 3/3-3/6, and then started again on 3/18-3/23 and it was very heavy. She went to [facility] for the bleeding and clots. This month she had a period on 4/11-4/14. She states that she is still having clots pass. Also states that they found something on her cervix that she needed to followup on.

## 2014-01-19 NOTE — Progress Notes (Signed)
   Subjective:    Patient ID: Sheila Bishop, female    DOB: 06/25/1971, 43 y.o.   MRN: [id number]  HPI  43 yo SAA P4 ( 26 to 43 yo) here for evaluation of 1) cervical "mass" and 2) menorrhagia with clots and DUB (2 periods in March).  Review of Systems     Objective:   Physical Exam Nabothian cyst on cervix (reassurance given)       Assessment & Plan:  Preventative care- pap done today Schedule mammogram  DUB/menorrhagia- check TSH and gyn u/s

## 2014-01-19 NOTE — Patient Instructions (Signed)

## 2014-01-20 LAB — WET PREP, GENITAL
CLUE CELLS WET PREP: NONE SEEN
TRICH WET PREP: NONE SEEN
WBC, Wet Prep HPF POC: NONE SEEN
YEAST WET PREP: NONE SEEN

## 2014-01-21 ENCOUNTER — Encounter: Payer: Self-pay | Admitting: *Deleted

## 2014-01-28 ENCOUNTER — Ambulatory Visit (HOSPITAL_COMMUNITY)
Admission: RE | Admit: 2014-01-28 | Discharge: 2014-01-28 | Disposition: A | Discharge status: Home / Self Care | Payer: Self-pay | Source: Ambulatory Visit | Attending: Obstetrics & Gynecology | Admitting: Obstetrics & Gynecology

## 2014-01-28 DIAGNOSIS — N949 Unspecified condition associated with female genital organs and menstrual cycle: Secondary | ICD-10-CM | POA: Insufficient documentation

## 2014-01-28 DIAGNOSIS — D72829 Elevated white blood cell count, unspecified: Secondary | ICD-10-CM | POA: Insufficient documentation

## 2014-01-28 DIAGNOSIS — N83209 Unspecified ovarian cyst, unspecified side: Secondary | ICD-10-CM | POA: Insufficient documentation

## 2014-01-28 DIAGNOSIS — N925 Other specified irregular menstruation: Secondary | ICD-10-CM | POA: Insufficient documentation

## 2014-01-28 DIAGNOSIS — N938 Other specified abnormal uterine and vaginal bleeding: Secondary | ICD-10-CM | POA: Insufficient documentation

## 2014-01-28 DIAGNOSIS — N920 Excessive and frequent menstruation with regular cycle: Secondary | ICD-10-CM

## 2014-02-05 ENCOUNTER — Other Ambulatory Visit: Payer: Self-pay | Admitting: Obstetrics & Gynecology

## 2014-02-05 DIAGNOSIS — Z1231 Encounter for screening mammogram for malignant neoplasm of breast: Secondary | ICD-10-CM

## 2014-02-11 ENCOUNTER — Ambulatory Visit (HOSPITAL_COMMUNITY): Payer: Self-pay | Attending: Obstetrics & Gynecology

## 2014-03-04 ENCOUNTER — Ambulatory Visit (HOSPITAL_COMMUNITY): Payer: Self-pay | Attending: Obstetrics & Gynecology

## 2014-03-04 ENCOUNTER — Ambulatory Visit: Payer: Self-pay | Admitting: Obstetrics & Gynecology

## 2014-03-04 ENCOUNTER — Telehealth: Payer: Self-pay | Admitting: *Deleted

## 2014-03-04 NOTE — Telephone Encounter (Signed)
Patient missed her appointment in clinic today. I called her and she stated that she forgot the appointment. She would like to reschedule. I advised that I will let front desk know that she would like to reschedule and they will contact her with new appointment.

## 2014-03-10 ENCOUNTER — Encounter: Payer: Self-pay | Admitting: Obstetrics & Gynecology

## 2014-04-20 ENCOUNTER — Encounter: Payer: Self-pay | Admitting: Obstetrics & Gynecology

## 2014-04-20 ENCOUNTER — Ambulatory Visit (INDEPENDENT_AMBULATORY_CARE_PROVIDER_SITE_OTHER): Payer: Self-pay | Admitting: Obstetrics & Gynecology

## 2014-04-20 ENCOUNTER — Other Ambulatory Visit (HOSPITAL_COMMUNITY)
Admission: RE | Admit: 2014-04-20 | Discharge: 2014-04-20 | Disposition: A | Discharge status: Home / Self Care | Payer: Self-pay | Source: Ambulatory Visit | Attending: Obstetrics & Gynecology | Admitting: Obstetrics & Gynecology

## 2014-04-20 VITALS — BP 136/95 | HR 95 | Temp 98.0°F | Ht 66.0 in | Wt 205.4 lb

## 2014-04-20 DIAGNOSIS — N925 Other specified irregular menstruation: Secondary | ICD-10-CM

## 2014-04-20 DIAGNOSIS — N949 Unspecified condition associated with female genital organs and menstrual cycle: Secondary | ICD-10-CM | POA: Insufficient documentation

## 2014-04-20 DIAGNOSIS — N83209 Unspecified ovarian cyst, unspecified side: Secondary | ICD-10-CM | POA: Insufficient documentation

## 2014-04-20 DIAGNOSIS — N938 Other specified abnormal uterine and vaginal bleeding: Secondary | ICD-10-CM | POA: Insufficient documentation

## 2014-04-20 LAB — POCT PREGNANCY, URINE: PREG TEST UR: NEGATIVE

## 2014-04-20 NOTE — Progress Notes (Signed)
   Subjective:    Patient ID: Sheila Bishop, female    DOB: Jan 06, 1971, 43 y.o.   MRN: [id number]  HPI  This 43 yo S AA lady is here with continued DUB. She describes clots with her periods which are not regular. She also is interested in contraception. She is currently having unprotected IC but does not want a pregnancy at this time. She denies any problems.  Review of Systems An u/s done about 3 months ago was normal with the exception of the left ovary (? Small abscess)    Objective:   Physical Exam   UPT negative, consent signed, time out done Cervix prepped with betadine and grasped with a single tooth tenaculum Uterus sounded to 9 cm Pipelle used for 2 passes with a moderate amount of tissue obtained. She tolerated the procedure well.        Assessment & Plan:  DUB- await EMBX  I have suggested a Mirena which would help her DUB as well as provide reliable contraception. She is going to fill out a Mirena scholarship application.j Preventative care- She will fill out the mammogram scholarship application. Follow up left ovary incidental finding

## 2014-04-22 ENCOUNTER — Ambulatory Visit (HOSPITAL_COMMUNITY)
Admission: RE | Admit: 2014-04-22 | Discharge: 2014-04-22 | Disposition: A | Discharge status: Home / Self Care | Payer: Self-pay | Source: Ambulatory Visit | Attending: Obstetrics & Gynecology | Admitting: Obstetrics & Gynecology

## 2014-04-22 DIAGNOSIS — N8300 Follicular cyst of ovary, unspecified side: Secondary | ICD-10-CM | POA: Insufficient documentation

## 2014-04-22 DIAGNOSIS — N83209 Unspecified ovarian cyst, unspecified side: Secondary | ICD-10-CM

## 2014-04-22 DIAGNOSIS — N920 Excessive and frequent menstruation with regular cycle: Secondary | ICD-10-CM | POA: Insufficient documentation

## 2014-05-26 ENCOUNTER — Encounter: Payer: Self-pay | Admitting: General Practice

## 2014-07-22 ENCOUNTER — Telehealth: Payer: Self-pay | Admitting: *Deleted

## 2014-07-22 NOTE — Telephone Encounter (Signed)
Attempted to contact patient, no answer, left message for patient to contact clinic.  We need financial information for [facility] that patient has not provided.  Need to inquire if patient still desires Mirena.

## 2014-07-24 NOTE — Telephone Encounter (Signed)
Sent letter concerning if patient is still interested in free Mirena for management of bleeding.

## 2014-08-03 ENCOUNTER — Encounter: Payer: Self-pay | Admitting: Obstetrics & Gynecology

## 2014-08-05 ENCOUNTER — Telehealth: Payer: Self-pay | Admitting: General Practice

## 2014-08-05 NOTE — Telephone Encounter (Signed)
Patient called and left message stating she would like her biopsy results. Called patient back and informed her of normal bx results. Patient verbalized understanding and had no questions

## 2014-08-15 ENCOUNTER — Emergency Department (HOSPITAL_COMMUNITY)
Admission: EM | Admit: 2014-08-15 | Discharge: 2014-08-15 | Disposition: A | Discharge status: Home / Self Care | Payer: Self-pay | Attending: Emergency Medicine | Admitting: Emergency Medicine

## 2014-08-15 ENCOUNTER — Encounter (HOSPITAL_COMMUNITY): Payer: Self-pay | Admitting: Cardiology

## 2014-08-15 DIAGNOSIS — Z8659 Personal history of other mental and behavioral disorders: Secondary | ICD-10-CM | POA: Insufficient documentation

## 2014-08-15 DIAGNOSIS — D72829 Elevated white blood cell count, unspecified: Secondary | ICD-10-CM | POA: Insufficient documentation

## 2014-08-15 DIAGNOSIS — Z872 Personal history of diseases of the skin and subcutaneous tissue: Secondary | ICD-10-CM | POA: Insufficient documentation

## 2014-08-15 DIAGNOSIS — E119 Type 2 diabetes mellitus without complications: Secondary | ICD-10-CM | POA: Insufficient documentation

## 2014-08-15 DIAGNOSIS — N739 Female pelvic inflammatory disease, unspecified: Secondary | ICD-10-CM

## 2014-08-15 DIAGNOSIS — Z87891 Personal history of nicotine dependence: Secondary | ICD-10-CM | POA: Insufficient documentation

## 2014-08-15 DIAGNOSIS — E669 Obesity, unspecified: Secondary | ICD-10-CM | POA: Insufficient documentation

## 2014-08-15 DIAGNOSIS — I10 Essential (primary) hypertension: Secondary | ICD-10-CM | POA: Insufficient documentation

## 2014-08-15 DIAGNOSIS — Z3202 Encounter for pregnancy test, result negative: Secondary | ICD-10-CM | POA: Insufficient documentation

## 2014-08-15 DIAGNOSIS — Z79899 Other long term (current) drug therapy: Secondary | ICD-10-CM | POA: Insufficient documentation

## 2014-08-15 DIAGNOSIS — Z973 Presence of spectacles and contact lenses: Secondary | ICD-10-CM | POA: Insufficient documentation

## 2014-08-15 LAB — CBC WITH DIFFERENTIAL/PLATELET
Basophils Absolute: 0 10*3/uL (ref 0.0–0.1)
Basophils Relative: 0 % (ref 0–1)
EOS PCT: 2 % (ref 0–5)
Eosinophils Absolute: 0.3 10*3/uL (ref 0.0–0.7)
HCT: 35.8 % — ABNORMAL LOW (ref 36.0–46.0)
Hemoglobin: 12 g/dL (ref 12.0–15.0)
LYMPHS ABS: 3.2 10*3/uL (ref 0.7–4.0)
LYMPHS PCT: 27 % (ref 12–46)
MCH: 26.3 pg (ref 26.0–34.0)
MCHC: 33.5 g/dL (ref 30.0–36.0)
MCV: 78.5 fL (ref 78.0–100.0)
Monocytes Absolute: 0.8 10*3/uL (ref 0.1–1.0)
Monocytes Relative: 7 % (ref 3–12)
Neutro Abs: 7.6 10*3/uL (ref 1.7–7.7)
Neutrophils Relative %: 64 % (ref 43–77)
Platelets: 339 10*3/uL (ref 150–400)
RBC: 4.56 MIL/uL (ref 3.87–5.11)
RDW: 13.1 % (ref 11.5–15.5)
WBC: 11.8 10*3/uL — AB (ref 4.0–10.5)

## 2014-08-15 LAB — URINALYSIS, ROUTINE W REFLEX MICROSCOPIC
Bilirubin Urine: NEGATIVE
Glucose, UA: 250 mg/dL — AB
Ketones, ur: NEGATIVE mg/dL
Nitrite: NEGATIVE
PROTEIN: NEGATIVE mg/dL
Specific Gravity, Urine: 1.015 (ref 1.005–1.030)
UROBILINOGEN UA: 1 mg/dL (ref 0.0–1.0)
pH: 6 (ref 5.0–8.0)

## 2014-08-15 LAB — URINE MICROSCOPIC-ADD ON

## 2014-08-15 LAB — COMPREHENSIVE METABOLIC PANEL
ALT: 10 U/L (ref 0–35)
ANION GAP: 13 (ref 5–15)
AST: 12 U/L (ref 0–37)
Albumin: 3.8 g/dL (ref 3.5–5.2)
Alkaline Phosphatase: 118 U/L — ABNORMAL HIGH (ref 39–117)
BILIRUBIN TOTAL: 0.3 mg/dL (ref 0.3–1.2)
BUN: 6 mg/dL (ref 6–23)
CHLORIDE: 95 meq/L — AB (ref 96–112)
CO2: 26 meq/L (ref 19–32)
CREATININE: 0.46 mg/dL — AB (ref 0.50–1.10)
Calcium: 9.5 mg/dL (ref 8.4–10.5)
GFR calc Af Amer: >90 mL/min (ref >90)
Glucose, Bld: 171 mg/dL — ABNORMAL HIGH (ref 70–99)
Potassium: 3.4 mEq/L — ABNORMAL LOW (ref 3.7–5.3)
Sodium: 134 mEq/L — ABNORMAL LOW (ref 137–147)
Total Protein: 7.9 g/dL (ref 6.0–8.3)

## 2014-08-15 LAB — WET PREP, GENITAL
Trich, Wet Prep: NONE SEEN
YEAST WET PREP: NONE SEEN

## 2014-08-15 LAB — LIPASE, BLOOD: Lipase: 25 U/L (ref 11–59)

## 2014-08-15 LAB — POC URINE PREG, ED: PREG TEST UR: NEGATIVE

## 2014-08-15 LAB — RPR

## 2014-08-15 LAB — HIV ANTIBODY (ROUTINE TESTING W REFLEX): HIV: NONREACTIVE

## 2014-08-15 MED ORDER — LIDOCAINE HCL (PF) 1 % IJ SOLN
INTRAMUSCULAR | Status: AC
  Administered 2014-08-15: 5 mL
  Filled 2014-08-15: qty 5

## 2014-08-15 MED ORDER — CEFTRIAXONE SODIUM 250 MG IJ SOLR
250.0000 mg | Freq: Once | INTRAMUSCULAR | Status: AC
  Administered 2014-08-15: 250 mg via INTRAMUSCULAR
  Filled 2014-08-15: qty 250

## 2014-08-15 MED ORDER — ONDANSETRON 4 MG PO TBDP
4.0000 mg | ORAL_TABLET | Freq: Once | ORAL | Status: AC
  Administered 2014-08-15: 4 mg via ORAL
  Filled 2014-08-15: qty 1

## 2014-08-15 MED ORDER — AZITHROMYCIN 250 MG PO TABS
1000.0000 mg | ORAL_TABLET | Freq: Once | ORAL | Status: AC
  Administered 2014-08-15: 1000 mg via ORAL
  Filled 2014-08-15: qty 4

## 2014-08-15 NOTE — Discharge Instructions (Signed)
Return to the emergency room with worsening of symptoms, new symptoms or with symptoms that are concerning, especially fevers, chills, severe abdominal pain, nausea, vomiting.  Do not have intercourse until your partners has been tested and completed abx. You may go to the health department to be tested for STD in the future.

## 2014-08-15 NOTE — ED Provider Notes (Signed)
CSN: [id number]     Arrival date & time 08/15/14  1121 History   First MD Initiated Contact with Patient 08/15/14 1259     Chief Complaint  Patient presents with  . Abdominal Pain  . Pelvic Pain     (Consider location/radiation/quality/duration/timing/severity/associated sxs/prior Treatment) HPI  Sheila Bishop is a 43 y.o. female with PMH of HTN, DM, depression presenting with vaginal pain and swelling that started last night at onset of her menses. Patient also reported burning when washing the area. Patient denies increased discharge or foul odor to discharge. Patient denies nausea vomiting, fever, chills, abdominal pain or back pain. Patient denies urinary complaints. She denies new sexual contacts and does not use a condom. Last menstrual period one month ago and normal. Pt eating and drinking like normal. No changes in stool.    Past Medical History  Diagnosis Date  . Breast abscess     left  . Diabetes mellitus   . Hypertension   . Headache(784.0)   . Depression   . Wears glasses   . Obesity    Past Surgical History  Procedure Laterality Date  . Breast biopsy  08/07/2011    Procedure: BREAST BIOPSY;  Surgeon: Joyice Faster. Cornett, MD;  Location: [facility];  Service: General;  Laterality: Left;  BIOPSY BREAST LEFT SIDE  . Cesarean section  2002   History reviewed. No pertinent family history. History  Substance Use Topics  . Smoking status: Former Smoker    Quit date: 12/31/2007  . Smokeless tobacco: Not on file  . Alcohol Use: No   OB History    Gravida Para Term Preterm AB TAB SAB Ectopic Multiple Living   4 3 3  0 1  1  1 4      Review of Systems  Constitutional: Negative for fever and chills.  HENT: Negative for congestion and rhinorrhea.   Eyes: Negative for visual disturbance.  Respiratory: Negative for cough and shortness of breath.   Cardiovascular: Negative for chest pain and palpitations.  Gastrointestinal: Negative for nausea, vomiting and diarrhea.   Genitourinary: Positive for vaginal bleeding and pelvic pain. Negative for dysuria and hematuria.  Musculoskeletal: Negative for back pain and gait problem.  Skin: Negative for rash.  Neurological: Negative for weakness and headaches.      Allergies  Review of patient's allergies indicates no known allergies.  Home Medications   Prior to Admission medications   Medication Sig Start Date End Date Taking? Authorizing Provider  glipiZIDE (GLUCOTROL) 10 MG tablet Take 10 mg by mouth 2 (two) times daily before a meal.      Historical Provider, MD  lisinopril (PRINIVIL,ZESTRIL) 20 MG tablet Take 20 mg by mouth daily.      Historical Provider, MD   BP 145/94 mmHg  Pulse 95  Temp(Src) 98.5 F (36.9 C) (Oral)  Resp 18  Ht 5\' 9"  (1.753 m)  SpO2 100%  LMP 08/13/2014 Physical Exam  Constitutional: She appears well-developed and well-nourished. No distress.  HENT:  Head: Normocephalic and atraumatic.  Eyes: Conjunctivae and EOM are normal. Right eye exhibits no discharge. Left eye exhibits no discharge.  Cardiovascular: Normal rate, regular rhythm and normal heart sounds.   Pulmonary/Chest: Effort normal and breath sounds normal. No respiratory distress. She has no wheezes.  Abdominal: Soft. Bowel sounds are normal. She exhibits no distension. There is no tenderness.  No CVA tenderness.  Genitourinary:  Pt with erythema and swelling below urethra. tenderness. Cervix pink without lesions. Os closed. CMT with  mild left and right adnexal tenderness. No masses appreciated. Mild white opaque discharge without foul odor. Nursing tech in room for exam.   Neurological: She is alert. She exhibits normal muscle tone. Coordination normal.  Skin: Skin is warm and dry. She is not diaphoretic.  Nursing note and vitals reviewed.   ED Course  Procedures (including critical care time) Labs Review Labs Reviewed  WET PREP, GENITAL - Abnormal; Notable for the following:    Clue Cells Wet Prep HPF  POC FEW (*)    WBC, Wet Prep HPF POC FEW (*)    All other components within normal limits  URINALYSIS, ROUTINE W REFLEX MICROSCOPIC - Abnormal; Notable for the following:    APPearance CLOUDY (*)    Glucose, UA 250 (*)    Hgb urine dipstick LARGE (*)    Leukocytes, UA SMALL (*)    All other components within normal limits  URINE MICROSCOPIC-ADD ON - Abnormal; Notable for the following:    Squamous Epithelial / LPF MANY (*)    Bacteria, UA FEW (*)    All other components within normal limits  CBC WITH DIFFERENTIAL - Abnormal; Notable for the following:    WBC 11.8 (*)    HCT 35.8 (*)    All other components within normal limits  COMPREHENSIVE METABOLIC PANEL - Abnormal; Notable for the following:    Sodium 134 (*)    Potassium 3.4 (*)    Chloride 95 (*)    Glucose, Bld 171 (*)    Creatinine, Ser 0.46 (*)    Alkaline Phosphatase 118 (*)    All other components within normal limits  GC/CHLAMYDIA PROBE AMP  LIPASE, BLOOD  RPR  HIV ANTIBODY (ROUTINE TESTING)  POC URINE PREG, ED    Imaging Review No results found.   EKG Interpretation None      Meds given in ED:  Medications  cefTRIAXone (ROCEPHIN) injection 250 mg (250 mg Intramuscular Given 08/15/14 1509)  azithromycin (ZITHROMAX) tablet 1,000 mg (1,000 mg Oral Given 08/15/14 1508)  ondansetron (ZOFRAN-ODT) disintegrating tablet 4 mg (4 mg Oral Given 08/15/14 1535)  lidocaine (PF) (XYLOCAINE) 1 % injection (5 mLs  Given 08/15/14 1509)    Discharge Medication List as of 08/15/2014  3:31 PM        MDM   Final diagnoses:  PID (pelvic inflammatory disease)   Pt presents with erythema, swelling and tenderness to introitus. Pt with more than normal tenderness on pelvic exam with CMT and right and left adnexal pain. Mild leukocytosis. Wet prep with few WBC. Pt diagnosed with PID. Will treat as above. STD testing pending. Pt with completely benign abdominal exam, afebrile, nontoxic, no adnexal masses noted. I doubt TOA.  Pt also with tenderness to introitus that may be related but is likely due to irritation from washing or clothing. Pt with asymptomatic bacteriuria with many squamous cells. Likely contaminant. Pt with large hgb but on her menses.  Pt to follow up with her PCP.   Discussed return precautions with patient. Discussed all results and patient verbalizes understanding and agrees with plan.      Pura Spice, PA-C 08/15/14 [place], MD 08/16/14 [phone]

## 2014-08-15 NOTE — ED Notes (Signed)
Pt reports pelvic pain with "swelling to her vagina". States that the area is sore and tender, recently started her period. Pt reports burning with washing the area. Denies any discharge or odor.

## 2014-08-17 LAB — GC/CHLAMYDIA PROBE AMP
CT Probe RNA: NEGATIVE
GC Probe RNA: NEGATIVE

## 2014-09-23 ENCOUNTER — Ambulatory Visit: Payer: Self-pay | Admitting: Obstetrics & Gynecology

## 2014-10-25 ENCOUNTER — Emergency Department (HOSPITAL_COMMUNITY): Payer: Self-pay

## 2014-10-25 ENCOUNTER — Emergency Department (HOSPITAL_COMMUNITY)
Admission: EM | Admit: 2014-10-25 | Discharge: 2014-10-25 | Disposition: A | Discharge status: Home / Self Care | Payer: Self-pay | Attending: Emergency Medicine | Admitting: Emergency Medicine

## 2014-10-25 ENCOUNTER — Encounter (HOSPITAL_COMMUNITY): Payer: Self-pay | Admitting: Emergency Medicine

## 2014-10-25 DIAGNOSIS — E669 Obesity, unspecified: Secondary | ICD-10-CM | POA: Insufficient documentation

## 2014-10-25 DIAGNOSIS — Z79899 Other long term (current) drug therapy: Secondary | ICD-10-CM | POA: Insufficient documentation

## 2014-10-25 DIAGNOSIS — Z872 Personal history of diseases of the skin and subcutaneous tissue: Secondary | ICD-10-CM | POA: Insufficient documentation

## 2014-10-25 DIAGNOSIS — I10 Essential (primary) hypertension: Secondary | ICD-10-CM | POA: Insufficient documentation

## 2014-10-25 DIAGNOSIS — E119 Type 2 diabetes mellitus without complications: Secondary | ICD-10-CM | POA: Insufficient documentation

## 2014-10-25 DIAGNOSIS — J40 Bronchitis, not specified as acute or chronic: Secondary | ICD-10-CM | POA: Insufficient documentation

## 2014-10-25 DIAGNOSIS — R0602 Shortness of breath: Secondary | ICD-10-CM

## 2014-10-25 DIAGNOSIS — Z8659 Personal history of other mental and behavioral disorders: Secondary | ICD-10-CM | POA: Insufficient documentation

## 2014-10-25 DIAGNOSIS — R059 Cough, unspecified: Secondary | ICD-10-CM

## 2014-10-25 DIAGNOSIS — Z87891 Personal history of nicotine dependence: Secondary | ICD-10-CM | POA: Insufficient documentation

## 2014-10-25 DIAGNOSIS — R05 Cough: Secondary | ICD-10-CM

## 2014-10-25 LAB — CBC
HCT: 33 % — ABNORMAL LOW (ref 36.0–46.0)
HEMOGLOBIN: 11.2 g/dL — AB (ref 12.0–15.0)
MCH: 26.5 pg (ref 26.0–34.0)
MCHC: 33.9 g/dL (ref 30.0–36.0)
MCV: 78.2 fL (ref 78.0–100.0)
PLATELETS: 281 10*3/uL (ref 150–400)
RBC: 4.22 MIL/uL (ref 3.87–5.11)
RDW: 12.5 % (ref 11.5–15.5)
WBC: 19.5 10*3/uL — ABNORMAL HIGH (ref 4.0–10.5)

## 2014-10-25 LAB — BASIC METABOLIC PANEL
Anion gap: 7 (ref 5–15)
BUN: 7 mg/dL (ref 6–23)
CHLORIDE: 101 mmol/L (ref 96–112)
CO2: 28 mmol/L (ref 19–32)
Calcium: 8.9 mg/dL (ref 8.4–10.5)
Creatinine, Ser: 0.54 mg/dL (ref 0.50–1.10)
GFR calc Af Amer: >90 mL/min (ref >90)
GLUCOSE: 266 mg/dL — AB (ref 70–99)
POTASSIUM: 3.2 mmol/L — AB (ref 3.5–5.1)
Sodium: 136 mmol/L (ref 135–145)

## 2014-10-25 LAB — I-STAT CG4 LACTIC ACID, ED: Lactic Acid, Venous: 2.07 mmol/L (ref 0.5–2.0)

## 2014-10-25 MED ORDER — ACETAMINOPHEN 500 MG PO TABS
500.0000 mg | ORAL_TABLET | Freq: Once | ORAL | Status: AC
  Administered 2014-10-25: 500 mg via ORAL
  Filled 2014-10-25: qty 1

## 2014-10-25 MED ORDER — ALBUTEROL SULFATE HFA 108 (90 BASE) MCG/ACT IN AERS
2.0000 | INHALATION_SPRAY | Freq: Once | RESPIRATORY_TRACT | Status: AC
  Administered 2014-10-25: 2 via RESPIRATORY_TRACT
  Filled 2014-10-25: qty 6.7

## 2014-10-25 MED ORDER — PREDNISONE 20 MG PO TABS: 40.0000 mg | ORAL_TABLET | Freq: Every day | ORAL | Status: DC

## 2014-10-25 MED ORDER — IOHEXOL 350 MG/ML SOLN
100.0000 mL | Freq: Once | INTRAVENOUS | Status: AC | PRN
  Administered 2014-10-25: 100 mL via INTRAVENOUS

## 2014-10-25 NOTE — ED Notes (Signed)
Patient returned from CT

## 2014-10-25 NOTE — ED Notes (Signed)
Lactic Acid given to PA Dansie

## 2014-10-25 NOTE — ED Provider Notes (Signed)
CSN: [id number]     Arrival date & time 10/25/14  1209 History   First MD Initiated Contact with Patient 10/25/14 1232     Chief Complaint  Patient presents with  . Shortness of Breath  . Cough   Sheila Bishop is a 44 y.o. female with a history of chronic bronchitis, diabetes, hypertension who presents complaining of productive cough for the past 2-3 weeks and shortness of breath beginning today. Patient reports she was wheezing heavily earlier today and feeling short of breath when she called EMS. EMS gave her an albuterol treatment and 125 Solu-Medrol and she reports her shortness of breath resolved with albuterol treatment. Patient reports history of chronic bronchitis and is not taking any meds as her insurance card has expired. Patient reports chills without fevers recently. Patient does have history of pneumonia back in 2009. Patient denies fevers, chest pain, palpitations, lightheadedness, dizziness, abdominal pain, nausea, vomiting, diarrhea, leg swelling or hemoptysis. Patient denies personal or family history PEs or DVTs. Patient denies personal or family history of blood clotting disorders such as factor V leiden, protein C or S deficiency. Patient denies recent surgeries or long travel. Patient denies smoking.   (Consider location/radiation/quality/duration/timing/severity/associated sxs/prior Treatment) HPI  Past Medical History  Diagnosis Date  . Breast abscess     left  . Diabetes mellitus   . Hypertension   . Headache(784.0)   . Depression   . Wears glasses   . Obesity    Past Surgical History  Procedure Laterality Date  . Breast biopsy  08/07/2011    Procedure: BREAST BIOPSY;  Surgeon: Joyice Faster. Cornett, MD;  Location: [facility];  Service: General;  Laterality: Left;  BIOPSY BREAST LEFT SIDE  . Cesarean section  2002   No family history on file. History  Substance Use Topics  . Smoking status: Former Smoker    Quit date: 12/31/2007  . Smokeless tobacco: Not on file   . Alcohol Use: No   OB History    Gravida Para Term Preterm AB TAB SAB Ectopic Multiple Living   4 3 3  0 1  1  1 4      Review of Systems  Constitutional: Positive for chills. Negative for fever.  HENT: Negative for congestion, postnasal drip, sneezing and sore throat.   Eyes: Negative for pain and visual disturbance.  Respiratory: Positive for cough, shortness of breath and wheezing.   Cardiovascular: Negative for chest pain, palpitations and leg swelling.  Gastrointestinal: Negative for nausea, vomiting, abdominal pain and diarrhea.  Genitourinary: Negative for dysuria and hematuria.  Musculoskeletal: Negative for back pain and neck pain.  Skin: Negative for rash.  Neurological: Negative for dizziness, light-headedness, numbness and headaches.      Allergies  Review of patient's allergies indicates no known allergies.  Home Medications   Prior to Admission medications   Medication Sig Start Date End Date Taking? Authorizing Provider  lisinopril (PRINIVIL,ZESTRIL) 20 MG tablet Take 20 mg by mouth daily.     Yes Historical Provider, MD   BP 132/78 mmHg  Pulse 119  Temp(Src) 100.2 F (37.9 C) (Oral)  Resp 32  Ht 5\' 6"  (1.676 m)  Wt 210 lb (95.255 kg)  BMI 33.91 kg/m2  SpO2 100%  LMP 10/19/2014 Physical Exam  Constitutional: She is oriented to person, place, and time. She appears well-developed and well-nourished. No distress.  HENT:  Head: Normocephalic and atraumatic.  Right Ear: External ear normal.  Left Ear: External ear normal.  Nose: Nose normal.  Mouth/Throat: Oropharynx is clear and moist. No oropharyngeal exudate.  Bilateral tympanic members are pearly-gray without erythema or loss of landmarks. Oropharynx is clear without erythema or tonsillar hypertrophy.  Eyes: Conjunctivae are normal. Pupils are equal, round, and reactive to light. Right eye exhibits no discharge. Left eye exhibits no discharge.  Neck: Normal range of motion. Neck supple.   Cardiovascular: Regular rhythm, normal heart sounds and intact distal pulses.  Exam reveals no gallop and no friction rub.   No murmur heard. Tachycardic at 140  Pulmonary/Chest: Effort normal and breath sounds normal. No respiratory distress. She has no wheezes. She has no rales.  Slightly diminished in left base. No wheezing or rhonchi noted. Respiratory rate 24.   Abdominal: Soft. She exhibits no distension. There is no tenderness.  Musculoskeletal: She exhibits no edema.  Lymphadenopathy:    She has no cervical adenopathy.  Neurological: She is alert and oriented to person, place, and time. Coordination normal.  Skin: Skin is warm and dry. No rash noted. She is not diaphoretic. No erythema. No pallor.  Psychiatric: She has a normal mood and affect. Her behavior is normal.  Nursing note and vitals reviewed.   ED Course  Procedures (including critical care time) Labs Review Labs Reviewed  BASIC METABOLIC PANEL - Abnormal; Notable for the following:    Potassium 3.2 (*)    Glucose, Bld 266 (*)    All other components within normal limits  CBC - Abnormal; Notable for the following:    WBC 19.5 (*)    Hemoglobin 11.2 (*)    HCT 33.0 (*)    All other components within normal limits  I-STAT CG4 LACTIC ACID, ED - Abnormal; Notable for the following:    Lactic Acid, Venous 2.07 (*)    All other components within normal limits    Imaging Review Dg Chest 2 View (if Patient Has Fever And/or Copd)  10/25/2014   CLINICAL DATA:  Wheezing, shaking, cough for 3 weeks  EXAM: CHEST  2 VIEW  COMPARISON:  08/02/2011  FINDINGS: Cardiomediastinal silhouette is stable. No acute infiltrate or pleural effusion. No pulmonary edema. Bony thorax is unremarkable.  IMPRESSION: No active cardiopulmonary disease.   Electronically Signed   By: Lahoma Crocker M.D.   On: 10/25/2014 13:52     EKG Interpretation   Date/Time:  Sunday October 25 2014 12:24:27 EST Ventricular Rate:  152 PR Interval:  115 QRS  Duration: 84 QT Interval:  273 QTC Calculation: 434 R Axis:   50 Text Interpretation:  Age not entered, assumed to be  44 years old for  purpose of ECG interpretation Sinus tachycardia Atrial premature complex  Probable left atrial enlargement agree Confirmed by Johnney Killian, MD, Jeannie Done  [phone]) on 10/25/2014 12:38:17 PM      Filed Vitals:   10/25/14 1412 10/25/14 1430 10/25/14 1500 10/25/14 1530  BP: 113/68 119/57 139/72 132/78  Pulse: 118 120 121 119  Temp: 100.2 F (37.9 C)     TempSrc: Oral     Resp: 17 26 28  32  Height:      Weight:      SpO2: 100% 100% 99% 100%     MDM   Meds given in ED:  Medications  iohexol (OMNIPAQUE) 350 MG/ML injection 100 mL (not administered)  acetaminophen (TYLENOL) tablet 500 mg (500 mg Oral Given 10/25/14 1302)    New Prescriptions   No medications on file    Final diagnoses:  Cough  Shortness of breath   This is  a 44 year old female with a history of chronic bronchitis who presents emergency department complaining of a productive cough for the past 2-3 weeks and shortness of breath with onset today. Patient reports wheezing and shortness of breath earlier today and called EMS. EMS provided albuterol treatment as well as 125 mg of Solu-Medrol. Patient reports that with albuterol treatment and her wheezing and shortness of breath has resolved. She no longer feels short of breath. Patient has a temperature 100.5 in the ED. Patient is tachycardic in the 130s. Patient is not hypoxic. Patient has a mildly elevated lactic acid of 2.07. Patient's BMP indicated potassium of 3.2. Patient's CBC shows an elevated white count of 19.5. Patient's chest x-ray is negative. Due to patient's white count, tachycardia and shortness of breath will do CT angio to rule out PE or occult pneumonia. Patient care handed off to Dr. Wilson Singer at shift change. He will disposition the patient.    This patient was discussed with Dr. Vallery Ridge who agrees with assessment and  plan.    Hanley Hays, PA-C 10/25/14 [place], MD 10/29/14 [phone]

## 2014-10-25 NOTE — ED Notes (Signed)
Received pt from home with c/o productive cough for few weeks and shortness of breath for couple days. Pt has history of bronchitis. Pt given 125 mg of solumedrol IV and an albuterol neb treatment by EMS.

## 2014-10-31 ENCOUNTER — Encounter (HOSPITAL_COMMUNITY): Payer: Self-pay | Admitting: *Deleted

## 2014-10-31 ENCOUNTER — Emergency Department (HOSPITAL_COMMUNITY): Payer: No Typology Code available for payment source

## 2014-10-31 ENCOUNTER — Inpatient Hospital Stay (HOSPITAL_COMMUNITY)
Admission: EM | Admit: 2014-10-31 | Discharge: 2014-11-05 | DRG: 871 | Disposition: A | Discharge status: Home / Self Care | Payer: No Typology Code available for payment source | Attending: Internal Medicine | Admitting: Internal Medicine

## 2014-10-31 DIAGNOSIS — R000 Tachycardia, unspecified: Secondary | ICD-10-CM | POA: Diagnosis not present

## 2014-10-31 DIAGNOSIS — I10 Essential (primary) hypertension: Secondary | ICD-10-CM | POA: Diagnosis present

## 2014-10-31 DIAGNOSIS — R319 Hematuria, unspecified: Secondary | ICD-10-CM | POA: Diagnosis present

## 2014-10-31 DIAGNOSIS — Z794 Long term (current) use of insulin: Secondary | ICD-10-CM | POA: Diagnosis not present

## 2014-10-31 DIAGNOSIS — Z6832 Body mass index (BMI) 32.0-32.9, adult: Secondary | ICD-10-CM | POA: Diagnosis not present

## 2014-10-31 DIAGNOSIS — E1100 Type 2 diabetes mellitus with hyperosmolarity without nonketotic hyperglycemic-hyperosmolar coma (NKHHC): Secondary | ICD-10-CM | POA: Diagnosis present

## 2014-10-31 DIAGNOSIS — E1101 Type 2 diabetes mellitus with hyperosmolarity with coma: Secondary | ICD-10-CM | POA: Diagnosis not present

## 2014-10-31 DIAGNOSIS — Z87891 Personal history of nicotine dependence: Secondary | ICD-10-CM

## 2014-10-31 DIAGNOSIS — D649 Anemia, unspecified: Secondary | ICD-10-CM | POA: Diagnosis present

## 2014-10-31 DIAGNOSIS — IMO0001 Reserved for inherently not codable concepts without codable children: Secondary | ICD-10-CM | POA: Insufficient documentation

## 2014-10-31 DIAGNOSIS — B9620 Unspecified Escherichia coli [E. coli] as the cause of diseases classified elsewhere: Secondary | ICD-10-CM | POA: Diagnosis present

## 2014-10-31 DIAGNOSIS — R739 Hyperglycemia, unspecified: Secondary | ICD-10-CM

## 2014-10-31 DIAGNOSIS — E669 Obesity, unspecified: Secondary | ICD-10-CM | POA: Diagnosis present

## 2014-10-31 DIAGNOSIS — A4151 Sepsis due to Escherichia coli [E. coli]: Secondary | ICD-10-CM | POA: Diagnosis not present

## 2014-10-31 DIAGNOSIS — J209 Acute bronchitis, unspecified: Secondary | ICD-10-CM | POA: Diagnosis present

## 2014-10-31 DIAGNOSIS — E861 Hypovolemia: Secondary | ICD-10-CM | POA: Diagnosis present

## 2014-10-31 DIAGNOSIS — F329 Major depressive disorder, single episode, unspecified: Secondary | ICD-10-CM | POA: Diagnosis present

## 2014-10-31 DIAGNOSIS — R7881 Bacteremia: Secondary | ICD-10-CM | POA: Diagnosis not present

## 2014-10-31 DIAGNOSIS — E860 Dehydration: Secondary | ICD-10-CM | POA: Diagnosis present

## 2014-10-31 DIAGNOSIS — N390 Urinary tract infection, site not specified: Secondary | ICD-10-CM

## 2014-10-31 DIAGNOSIS — A419 Sepsis, unspecified organism: Secondary | ICD-10-CM | POA: Diagnosis not present

## 2014-10-31 DIAGNOSIS — E1310 Other specified diabetes mellitus with ketoacidosis without coma: Secondary | ICD-10-CM | POA: Diagnosis not present

## 2014-10-31 DIAGNOSIS — E871 Hypo-osmolality and hyponatremia: Secondary | ICD-10-CM | POA: Diagnosis not present

## 2014-10-31 DIAGNOSIS — E1110 Type 2 diabetes mellitus with ketoacidosis without coma: Secondary | ICD-10-CM | POA: Diagnosis present

## 2014-10-31 LAB — I-STAT TROPONIN, ED: Troponin i, poc: 0.02 ng/mL (ref 0.00–0.08)

## 2014-10-31 LAB — BASIC METABOLIC PANEL
Anion gap: 15 (ref 5–15)
BUN: 7 mg/dL (ref 6–23)
CALCIUM: 9 mg/dL (ref 8.4–10.5)
CO2: 21 mmol/L (ref 19–32)
Chloride: 93 mmol/L — ABNORMAL LOW (ref 96–112)
Creatinine, Ser: 0.87 mg/dL (ref 0.50–1.10)
GFR calc Af Amer: >90 mL/min (ref >90)
GFR calc non Af Amer: 82 mL/min — ABNORMAL LOW (ref >90)
GLUCOSE: 593 mg/dL — AB (ref 70–99)
POTASSIUM: 3.7 mmol/L (ref 3.5–5.1)
Sodium: 129 mmol/L — ABNORMAL LOW (ref 135–145)

## 2014-10-31 LAB — I-STAT CG4 LACTIC ACID, ED: Lactic Acid, Venous: 5.51 mmol/L (ref 0.5–2.0)

## 2014-10-31 LAB — CBC
HCT: 34.6 % — ABNORMAL LOW (ref 36.0–46.0)
HEMOGLOBIN: 11.8 g/dL — AB (ref 12.0–15.0)
MCH: 26.3 pg (ref 26.0–34.0)
MCHC: 34.1 g/dL (ref 30.0–36.0)
MCV: 77.1 fL — ABNORMAL LOW (ref 78.0–100.0)
PLATELETS: 417 10*3/uL — AB (ref 150–400)
RBC: 4.49 MIL/uL (ref 3.87–5.11)
RDW: 12.7 % (ref 11.5–15.5)
WBC: 22.7 10*3/uL — ABNORMAL HIGH (ref 4.0–10.5)

## 2014-10-31 LAB — URINALYSIS, ROUTINE W REFLEX MICROSCOPIC
Bilirubin Urine: NEGATIVE
KETONES UR: NEGATIVE mg/dL
Nitrite: NEGATIVE
PH: 5.5 (ref 5.0–8.0)
PROTEIN: NEGATIVE mg/dL
Specific Gravity, Urine: 1.027 (ref 1.005–1.030)
Urobilinogen, UA: 0.2 mg/dL (ref 0.0–1.0)

## 2014-10-31 LAB — PREGNANCY, URINE: Preg Test, Ur: NEGATIVE

## 2014-10-31 LAB — URINE MICROSCOPIC-ADD ON

## 2014-10-31 LAB — CBG MONITORING, ED: GLUCOSE-CAPILLARY: 416 mg/dL — AB (ref 70–99)

## 2014-10-31 MED ORDER — VANCOMYCIN HCL IN DEXTROSE 1-5 GM/200ML-% IV SOLN
1000.0000 mg | Freq: Once | INTRAVENOUS | Status: AC
  Administered 2014-10-31: 1000 mg via INTRAVENOUS
  Filled 2014-10-31: qty 200

## 2014-10-31 MED ORDER — DEXTROSE-NACL 5-0.45 % IV SOLN: INTRAVENOUS | Status: DC

## 2014-10-31 MED ORDER — SODIUM CHLORIDE 0.9 % IV SOLN
INTRAVENOUS | Status: DC
  Administered 2014-10-31: 3.6 [IU]/h via INTRAVENOUS
  Filled 2014-10-31: qty 2.5

## 2014-10-31 MED ORDER — SODIUM CHLORIDE 0.9 % IV BOLUS (SEPSIS)
1000.0000 mL | Freq: Once | INTRAVENOUS | Status: AC
  Administered 2014-10-31: 1000 mL via INTRAVENOUS

## 2014-10-31 MED ORDER — PIPERACILLIN-TAZOBACTAM 3.375 G IVPB 30 MIN
3.3750 g | Freq: Once | INTRAVENOUS | Status: AC
  Administered 2014-10-31: 3.375 g via INTRAVENOUS
  Filled 2014-10-31: qty 50

## 2014-10-31 MED ORDER — SODIUM CHLORIDE 0.9 % IV SOLN
INTRAVENOUS | Status: DC
  Administered 2014-11-01: 125 mL/h via INTRAVENOUS

## 2014-10-31 MED ORDER — SODIUM CHLORIDE 0.9 % IV SOLN
INTRAVENOUS | Status: DC
  Administered 2014-11-01: 01:00:00 via INTRAVENOUS

## 2014-10-31 MED ORDER — PIPERACILLIN-TAZOBACTAM 3.375 G IVPB
3.3750 g | Freq: Three times a day (TID) | INTRAVENOUS | Status: DC
Start: 2014-11-01 — End: 2014-11-03
  Administered 2014-11-01 – 2014-11-03 (×7): 3.375 g via INTRAVENOUS
  Filled 2014-10-31 (×9): qty 50

## 2014-10-31 MED ORDER — DEXTROSE 50 % IV SOLN: 25.0000 mL | INTRAVENOUS | Status: DC | PRN

## 2014-10-31 MED ORDER — INSULIN REGULAR BOLUS VIA INFUSION
0.0000 [IU] | Freq: Three times a day (TID) | INTRAVENOUS | Status: DC
  Filled 2014-10-31: qty 10

## 2014-10-31 MED ORDER — VANCOMYCIN HCL IN DEXTROSE 1-5 GM/200ML-% IV SOLN
1000.0000 mg | Freq: Three times a day (TID) | INTRAVENOUS | Status: DC
  Administered 2014-11-01 – 2014-11-03 (×7): 1000 mg via INTRAVENOUS
  Filled 2014-10-31 (×9): qty 200

## 2014-10-31 NOTE — ED Notes (Signed)
[facility] contacted to call Code Sepsis Level 2

## 2014-10-31 NOTE — ED Notes (Signed)
I Stat Lactic Acid results shown to Dr. Milas Hock RN at triage desk.

## 2014-10-31 NOTE — Progress Notes (Signed)
ANTIBIOTIC CONSULT NOTE - INITIAL  Pharmacy Consult for Vancomycin and Zosyn Indication: rule out sepsis  No Known Allergies  Patient Measurements:   Actual Body Weight: 95.3 kg  Vital Signs: Temp: 99.9 F (37.7 C) (01/30 2010) Temp Source: Oral (01/30 2010) BP: 131/87 mmHg (01/30 2145) Pulse Rate: 121 (01/30 2146) Intake/Output from previous day:   Intake/Output from this shift:    Labs:  Recent Labs  10/31/14 2015  WBC 22.7*  HGB 11.8*  PLT 417*  CREATININE 0.87   Estimated Creatinine Clearance: 99 mL/min (by C-G formula based on Cr of 0.87). No results for input(s): VANCOTROUGH, VANCOPEAK, VANCORANDOM, GENTTROUGH, GENTPEAK, GENTRANDOM, TOBRATROUGH, TOBRAPEAK, TOBRARND, AMIKACINPEAK, AMIKACINTROU, AMIKACIN in the last 72 hours.   Microbiology: No results found for this or any previous visit (from the past 720 hour(s)).  Medical History: Past Medical History  Diagnosis Date  . Breast abscess     left  . Diabetes mellitus   . Hypertension   . Headache(784.0)   . Depression   . Wears glasses   . Obesity     Medications:   (Not in a hospital admission) Scheduled:   Infusions:  . sodium chloride    . piperacillin-tazobactam    . sodium chloride    . sodium chloride    . vancomycin     Assessment: 44yo female presents with cough and increased fatigue. Pharmacy is consulted to dose vancomycin and zosyn for sepsis. Pt is febrile to 99.9, WBC 22.7 (has been on prednisone PTA), sCr 0.87 with CrCl ~ 100 mL/min.  Goal of Therapy:  Vancomycin trough level 15-20 mcg/ml  Plan:  Vancomycin 1g IV q8h Zosyn 3.375g IV q8h Measure antibiotic drug levels at steady state Follow up culture results, renal function, and clinical course  Sheila Bishop. Diona Foley, PharmD Clinical Pharmacist Pager [phone] 10/31/2014,9:57 PM

## 2014-10-31 NOTE — ED Notes (Signed)
Vancomycin infusion complete.

## 2014-10-31 NOTE — ED Notes (Signed)
Pt in c/o continued cough, states she has been feeling fatigued, seen here recently and dx with bronchitis, was started on prednisone for that and thinks it is making her feel worse- today at home her BP was over 111 systolic, improved on arrival, no distress noted

## 2014-10-31 NOTE — ED Provider Notes (Signed)
CSN: [id number]     Arrival date & time 10/31/14  2006 History   First MD Initiated Contact with Patient 10/31/14 2058     Chief Complaint  Patient presents with  . Hypertension  . Fatigue      HPI  Pt was seen at 2110. Per pt, c/o gradual onset and worsening of persistent cough and generalized fatigue for the past 1 week. Pt states she was evaluated in the ED for same, dx bronchitis, rx prednisone. Pt states she stopped taking the prednisone 2 days ago because she "didn't like the way it made me feel." Denies home fevers, no CP/palpitations, no SOB, no abd pain, no N/V/D, no back pain.    Past Medical History  Diagnosis Date  . Breast abscess     left  . Diabetes mellitus   . Hypertension   . Headache(784.0)   . Depression   . Wears glasses   . Obesity    Past Surgical History  Procedure Laterality Date  . Breast biopsy  08/07/2011    Procedure: BREAST BIOPSY;  Surgeon: Joyice Faster. Cornett, MD;  Location: [facility];  Service: General;  Laterality: Left;  BIOPSY BREAST LEFT SIDE  . Cesarean section  2002    History  Substance Use Topics  . Smoking status: Former Smoker    Quit date: 12/31/2007  . Smokeless tobacco: Not on file  . Alcohol Use: No   OB History    Gravida Para Term Preterm AB TAB SAB Ectopic Multiple Living   4 3 3  0 1  1  1 4      Review of Systems ROS: Statement: All systems negative except as marked or noted in the HPI; Constitutional: Negative for fever and chills. +generalized weakness, fatigue. ; ; Eyes: Negative for eye pain, redness and discharge. ; ; ENMT: Negative for ear pain, hoarseness, nasal congestion, sinus pressure and sore throat. ; ; Cardiovascular: Negative for chest pain, palpitations, diaphoresis, dyspnea and peripheral edema. ; ; Respiratory: +cough. Negative for wheezing and stridor. ; ; Gastrointestinal: Negative for nausea, vomiting, diarrhea, abdominal pain, blood in stool, hematemesis, jaundice and rectal bleeding. . ; ; Genitourinary:  Negative for dysuria, flank pain and hematuria. ; ; Musculoskeletal: Negative for back pain and neck pain. Negative for swelling and trauma.; ; Skin: Negative for pruritus, rash, abrasions, blisters, bruising and skin lesion.; ; Neuro: Negative for headache, lightheadedness and neck stiffness. Negative for weakness, altered level of consciousness , altered mental status, extremity weakness, paresthesias, involuntary movement, seizure and syncope.      Allergies  Review of patient's allergies indicates no known allergies.  Home Medications   Prior to Admission medications   Medication Sig Start Date End Date Taking? Authorizing Provider  lisinopril (PRINIVIL,ZESTRIL) 20 MG tablet Take 20 mg by mouth daily.      Historical Provider, MD  predniSONE (DELTASONE) 20 MG tablet Take 2 tablets (40 mg total) by mouth daily. Patient not taking: Reported on 10/31/2014 10/25/14   Virgel Manifold, MD   BP 123/77 mmHg  Pulse 110  Temp(Src) 100.2 F (37.9 C) (Rectal)  Resp 21  SpO2 98%  LMP 10/19/2014 Physical Exam  2115; Physical examination:  Nursing notes reviewed; Vital signs and O2 SAT reviewed;  Constitutional: Well developed, Well nourished, In no acute distress; Head:  Normocephalic, atraumatic; Eyes: EOMI, PERRL, No scleral icterus; ENMT: Mouth and pharynx normal, Mucous membranes dry; Neck: Supple, Full range of motion, No lymphadenopathy; Cardiovascular: Tachycardic rate and rhythm, No gallop; Respiratory: Breath  sounds clear & equal bilaterally, No rales, rhonchi, wheezes.  Speaking full sentences with ease, Normal respiratory effort/excursion; Chest: Nontender, Movement normal; Abdomen: Soft, Nontender, Nondistended, Normal bowel sounds; Genitourinary: No CVA tenderness; Extremities: Pulses normal, No tenderness, No edema, No calf edema or asymmetry.; Neuro: AA&Ox3, Major CN grossly intact.  Speech clear. No gross focal motor or sensory deficits in extremities.; Skin: Color normal, Warm,  Dry.   ED Course  Procedures     EKG Interpretation   Date/Time:  Saturday October 31 2014 20:18:32 EST Ventricular Rate:  132 PR Interval:  126 QRS Duration: 84 QT Interval:  296 QTC Calculation: 438 R Axis:   56 Text Interpretation:  Sinus tachycardia Possible Left atrial enlargement  Borderline ECG When compared with ECG of 10/25/2014 No significant change  was found Confirmed by [facility]  MD, Nunzio Cory [phone]) on 10/31/2014 9:29:45  PM      MDM  MDM Reviewed: previous chart, nursing note and vitals Reviewed previous: labs, ECG and x-ray Interpretation: labs, ECG and x-ray Total time providing critical care: 30-74 minutes. This excludes time spent performing separately reportable procedures and services. Consults: admitting MD     CRITICAL CARE Performed by: Alfonzo Feller Total critical care time: 35 Critical care time was exclusive of separately billable procedures and treating other patients. Critical care was necessary to treat or prevent imminent or life-threatening deterioration. Critical care was time spent personally by me on the following activities: development of treatment plan with patient and/or surrogate as well as nursing, discussions with consultants, evaluation of patient's response to treatment, examination of patient, obtaining history from patient or surrogate, ordering and performing treatments and interventions, ordering and review of laboratory studies, ordering and review of radiographic studies, pulse oximetry and re-evaluation of patient's condition.    Results for orders placed or performed during the hospital encounter of 10/31/14  CBC  Result Value Ref Range   WBC 22.7 (H) 4.0 - 10.5 K/uL   RBC 4.49 3.87 - 5.11 MIL/uL   Hemoglobin 11.8 (L) 12.0 - 15.0 g/dL   HCT 34.6 (L) 36.0 - 46.0 %   MCV 77.1 (L) 78.0 - 100.0 fL   MCH 26.3 26.0 - 34.0 pg   MCHC 34.1 30.0 - 36.0 g/dL   RDW 12.7 11.5 - 15.5 %   Platelets 417 (H) 150 - 400 K/uL   Basic metabolic panel  Result Value Ref Range   Sodium 129 (L) 135 - 145 mmol/L   Potassium 3.7 3.5 - 5.1 mmol/L   Chloride 93 (L) 96 - 112 mmol/L   CO2 21 19 - 32 mmol/L   Glucose, Bld 593 (HH) 70 - 99 mg/dL   BUN 7 6 - 23 mg/dL   Creatinine, Ser 0.87 0.50 - 1.10 mg/dL   Calcium 9.0 8.4 - 10.5 mg/dL   GFR calc non Af Amer 82 (L) >90 mL/min   GFR calc Af Amer >90 >90 mL/min   Anion gap 15 5 - 15  Urinalysis, Routine w reflex microscopic  Result Value Ref Range   Color, Urine YELLOW YELLOW   APPearance CLEAR CLEAR   Specific Gravity, Urine 1.027 1.005 - 1.030   pH 5.5 5.0 - 8.0   Glucose, UA >1000 (A) NEGATIVE mg/dL   Hgb urine dipstick MODERATE (A) NEGATIVE   Bilirubin Urine NEGATIVE NEGATIVE   Ketones, ur NEGATIVE NEGATIVE mg/dL   Protein, ur NEGATIVE NEGATIVE mg/dL   Urobilinogen, UA 0.2 0.0 - 1.0 mg/dL   Nitrite NEGATIVE NEGATIVE   Leukocytes,  UA TRACE (A) NEGATIVE  Pregnancy, urine  Result Value Ref Range   Preg Test, Ur NEGATIVE NEGATIVE  Urine microscopic-add on  Result Value Ref Range   Squamous Epithelial / LPF RARE RARE   WBC, UA 11-20 <3 WBC/hpf   RBC / HPF 3-6 <3 RBC/hpf   Bacteria, UA FEW (A) RARE  I-stat troponin, ED (not at [facility])  Result Value Ref Range   Troponin i, poc 0.02 0.00 - 0.08 ng/mL   Comment 3          I-Stat CG4 Lactic Acid, ED  Result Value Ref Range   Lactic Acid, Venous 5.51 (HH) 0.5 - 2.0 mmol/L   Comment NOTIFIED PHYSICIAN   CBG monitoring, ED  Result Value Ref Range   Glucose-Capillary 416 (H) 70 - 99 mg/dL    Ct Angio Chest Pe W/cm &/or Wo Cm 10/25/2014   CLINICAL DATA:  Upper respiratory of infection. Productive cough. Intermittent shortness of breath. Fever.  EXAM: CT ANGIOGRAPHY CHEST WITH CONTRAST  TECHNIQUE: Multidetector CT imaging of the chest was performed using the standard protocol during bolus administration of intravenous contrast. Multiplanar CT image reconstructions and MIPs were obtained to evaluate the vascular  anatomy.  CONTRAST:  137mL OMNIPAQUE IOHEXOL 350 MG/ML SOLN  COMPARISON:  Multiple exams, including 02/23/2009 and 10/25/2014  FINDINGS: Reduced sensitivity due to dilute contrast bolus and patient body habitus. No filling defect is identified in the pulmonary arterial tree to suggest pulmonary embolus. No acute aortic abnormality observed.  Small type 1 hiatal hernia. No pathologic thoracic adenopathy. The a submental lymph node measures 1 cm in diameter, borderline prominent. Scattered small axillary lymph nodes are present.  The proximal tracheobronchial tree appears normal. Several small right middle lobe peripheral pulmonary nodules are unchanged from 2010 and considered benign. Similar small benign subpleural nodules along the lingula and left lower lobe.  Review of the MIP images confirms the above findings.  IMPRESSION: 1. No findings of pneumonia. No pulmonary embolus, although sensitivity for small emboli is reduced by body habitus and dilution of the contrast bolus. 2. Several tiny pulmonary nodules are unchanged from 2010 and considered benign and clinically insignificant. 3. Small type 1 hiatal hernia.   Electronically Signed   By: Sherryl Barters M.D.   On: 10/25/2014 17:15   Dg Chest Port 1 View 10/31/2014   CLINICAL DATA:  Cough and difficulty breathing ; fatigue  EXAM: PORTABLE CHEST - 1 VIEW  COMPARISON:  Chest radiograph August 02, 2011 and chest CT October 25, 2014  FINDINGS: There is no edema or consolidation. Heart is upper normal in size with pulmonary vascularity within normal limits. No adenopathy. No pneumothorax. No bone lesions.  IMPRESSION: No edema or consolidation.   Electronically Signed   By: Lowella Grip M.D.   On: 10/31/2014 21:27    2315:  IVF NS x2L given for tachycardia, elevated lactic acid and glucose. Tachycardia and CBG slowly improving. Na corrects to 137 for elevated glucose. IV insulin gtt started. IV abx given for sepsis after BC x2 and UC obtained. Dx and  testing d/w pt and family.  Questions answered.  Verb understanding, agreeable to admit. T/C to [facility] Dr. Arnoldo Morale, case discussed, including:  HPI, pertinent PM/SHx, VS/PE, dx testing, ED course and treatment:  Agreeable to admit, requests to write temporary orders, obtain stepdown bed to team [facility]mits.   Francine Graven, DO 11/02/14 1355

## 2014-11-01 ENCOUNTER — Encounter (HOSPITAL_COMMUNITY): Payer: Self-pay | Admitting: Family Medicine

## 2014-11-01 DIAGNOSIS — E1110 Type 2 diabetes mellitus with ketoacidosis without coma: Secondary | ICD-10-CM | POA: Diagnosis present

## 2014-11-01 DIAGNOSIS — I10 Essential (primary) hypertension: Secondary | ICD-10-CM | POA: Diagnosis present

## 2014-11-01 DIAGNOSIS — E871 Hypo-osmolality and hyponatremia: Secondary | ICD-10-CM | POA: Insufficient documentation

## 2014-11-01 DIAGNOSIS — A419 Sepsis, unspecified organism: Secondary | ICD-10-CM | POA: Diagnosis present

## 2014-11-01 DIAGNOSIS — N390 Urinary tract infection, site not specified: Secondary | ICD-10-CM | POA: Diagnosis present

## 2014-11-01 DIAGNOSIS — R000 Tachycardia, unspecified: Secondary | ICD-10-CM | POA: Insufficient documentation

## 2014-11-01 LAB — BASIC METABOLIC PANEL
ANION GAP: 8 (ref 5–15)
Anion gap: 7 (ref 5–15)
Anion gap: 8 (ref 5–15)
BUN: 6 mg/dL (ref 6–23)
BUN: <5 mg/dL — ABNORMAL LOW (ref 6–23)
CALCIUM: 8 mg/dL — AB (ref 8.4–10.5)
CO2: 25 mmol/L (ref 19–32)
CO2: 26 mmol/L (ref 19–32)
CO2: 26 mmol/L (ref 19–32)
CREATININE: 0.56 mg/dL (ref 0.50–1.10)
Calcium: 8.1 mg/dL — ABNORMAL LOW (ref 8.4–10.5)
Calcium: 8.1 mg/dL — ABNORMAL LOW (ref 8.4–10.5)
Chloride: 100 mmol/L (ref 96–112)
Chloride: 103 mmol/L (ref 96–112)
Chloride: 102 mmol/L (ref 96–112)
Creatinine, Ser: 0.62 mg/dL (ref 0.50–1.10)
Creatinine, Ser: 0.53 mg/dL (ref 0.50–1.10)
GFR calc Af Amer: >90 mL/min (ref >90)
GFR calc Af Amer: >90 mL/min (ref >90)
GLUCOSE: 131 mg/dL — AB (ref 70–99)
Glucose, Bld: 402 mg/dL — ABNORMAL HIGH (ref 70–99)
Glucose, Bld: 249 mg/dL — ABNORMAL HIGH (ref 70–99)
POTASSIUM: 3.3 mmol/L — AB (ref 3.5–5.1)
POTASSIUM: 3.6 mmol/L (ref 3.5–5.1)
POTASSIUM: 3.6 mmol/L (ref 3.5–5.1)
SODIUM: 136 mmol/L (ref 135–145)
SODIUM: 136 mmol/L (ref 135–145)
Sodium: 133 mmol/L — ABNORMAL LOW (ref 135–145)

## 2014-11-01 LAB — CBC
HCT: 32.3 % — ABNORMAL LOW (ref 36.0–46.0)
Hemoglobin: 10.8 g/dL — ABNORMAL LOW (ref 12.0–15.0)
MCH: 26.4 pg (ref 26.0–34.0)
MCHC: 33.4 g/dL (ref 30.0–36.0)
MCV: 79 fL (ref 78.0–100.0)
Platelets: 340 10*3/uL (ref 150–400)
RBC: 4.09 MIL/uL (ref 3.87–5.11)
RDW: 12.8 % (ref 11.5–15.5)
WBC: 19.6 10*3/uL — AB (ref 4.0–10.5)

## 2014-11-01 LAB — I-STAT CG4 LACTIC ACID, ED: LACTIC ACID, VENOUS: 1.15 mmol/L (ref 0.5–2.0)

## 2014-11-01 LAB — INFLUENZA PANEL BY PCR (TYPE A & B)
H1N1FLUPCR: NOT DETECTED
Influenza A By PCR: NEGATIVE
Influenza B By PCR: NEGATIVE

## 2014-11-01 LAB — MRSA PCR SCREENING: MRSA BY PCR: NEGATIVE

## 2014-11-01 LAB — CBG MONITORING, ED: Glucose-Capillary: 366 mg/dL — ABNORMAL HIGH (ref 70–99)

## 2014-11-01 LAB — GLUCOSE, CAPILLARY
Glucose-Capillary: 242 mg/dL — ABNORMAL HIGH (ref 70–99)
Glucose-Capillary: 300 mg/dL — ABNORMAL HIGH (ref 70–99)
Glucose-Capillary: 334 mg/dL — ABNORMAL HIGH (ref 70–99)

## 2014-11-01 MED ORDER — ENOXAPARIN SODIUM 40 MG/0.4ML ~~LOC~~ SOLN
40.0000 mg | SUBCUTANEOUS | Status: DC
  Administered 2014-11-01: 40 mg via SUBCUTANEOUS
  Filled 2014-11-01: qty 0.4

## 2014-11-01 MED ORDER — INSULIN GLARGINE 100 UNIT/ML ~~LOC~~ SOLN
25.0000 [IU] | Freq: Every day | SUBCUTANEOUS | Status: DC
  Administered 2014-11-01: 25 [IU] via SUBCUTANEOUS
  Filled 2014-11-01 (×2): qty 0.25

## 2014-11-01 MED ORDER — ALBUTEROL SULFATE (2.5 MG/3ML) 0.083% IN NEBU: 2.5000 mg | INHALATION_SOLUTION | Freq: Four times a day (QID) | RESPIRATORY_TRACT | Status: DC | PRN

## 2014-11-01 MED ORDER — SODIUM CHLORIDE 0.9 % IV SOLN
INTRAVENOUS | Status: DC
  Administered 2014-11-01 – 2014-11-03 (×5): via INTRAVENOUS

## 2014-11-01 MED ORDER — SODIUM CHLORIDE 0.9 % IV SOLN: 250.0000 mL | INTRAVENOUS | Status: DC | PRN

## 2014-11-01 MED ORDER — SODIUM CHLORIDE 0.9 % IV SOLN: INTRAVENOUS | Status: AC

## 2014-11-01 MED ORDER — ONDANSETRON HCL 4 MG PO TABS
4.0000 mg | ORAL_TABLET | Freq: Four times a day (QID) | ORAL | Status: DC | PRN
Start: 2014-11-01 — End: 2014-11-05

## 2014-11-01 MED ORDER — INSULIN ASPART 100 UNIT/ML ~~LOC~~ SOLN
0.0000 [IU] | Freq: Every day | SUBCUTANEOUS | Status: DC
  Administered 2014-11-01: 4 [IU] via SUBCUTANEOUS
  Administered 2014-11-02 – 2014-11-03 (×2): 3 [IU] via SUBCUTANEOUS

## 2014-11-01 MED ORDER — SODIUM CHLORIDE 0.9 % IJ SOLN: 3.0000 mL | INTRAMUSCULAR | Status: DC | PRN

## 2014-11-01 MED ORDER — ALBUTEROL SULFATE (2.5 MG/3ML) 0.083% IN NEBU
2.5000 mg | INHALATION_SOLUTION | Freq: Four times a day (QID) | RESPIRATORY_TRACT | Status: DC
  Administered 2014-11-01 (×3): 2.5 mg via RESPIRATORY_TRACT
  Filled 2014-11-01 (×3): qty 3

## 2014-11-01 MED ORDER — INSULIN ASPART 100 UNIT/ML ~~LOC~~ SOLN
4.0000 [IU] | Freq: Three times a day (TID) | SUBCUTANEOUS | Status: DC
  Administered 2014-11-01 – 2014-11-05 (×14): 4 [IU] via SUBCUTANEOUS

## 2014-11-01 MED ORDER — ONDANSETRON HCL 4 MG/2ML IJ SOLN
4.0000 mg | Freq: Four times a day (QID) | INTRAMUSCULAR | Status: DC | PRN
  Filled 2014-11-01: qty 2

## 2014-11-01 MED ORDER — ENOXAPARIN SODIUM 100 MG/ML ~~LOC~~ SOLN: 1.0000 mg/kg | Freq: Two times a day (BID) | SUBCUTANEOUS | Status: DC

## 2014-11-01 MED ORDER — INSULIN ASPART 100 UNIT/ML ~~LOC~~ SOLN
0.0000 [IU] | Freq: Three times a day (TID) | SUBCUTANEOUS | Status: DC
  Administered 2014-11-01: 5 [IU] via SUBCUTANEOUS
  Administered 2014-11-01: 3 [IU] via SUBCUTANEOUS

## 2014-11-01 MED ORDER — DEXTROSE-NACL 5-0.45 % IV SOLN
INTRAVENOUS | Status: DC
  Administered 2014-11-01: 03:00:00 via INTRAVENOUS

## 2014-11-01 MED ORDER — SODIUM CHLORIDE 0.9 % IV SOLN
INTRAVENOUS | Status: DC
  Administered 2014-11-01: 09:00:00 via INTRAVENOUS

## 2014-11-01 MED ORDER — POTASSIUM CHLORIDE CRYS ER 20 MEQ PO TBCR
40.0000 meq | EXTENDED_RELEASE_TABLET | Freq: Once | ORAL | Status: AC
  Administered 2014-11-01: 40 meq via ORAL
  Filled 2014-11-01: qty 2

## 2014-11-01 MED ORDER — SODIUM CHLORIDE 0.9 % IV SOLN: INTRAVENOUS | Status: DC

## 2014-11-01 MED ORDER — INSULIN ASPART 100 UNIT/ML ~~LOC~~ SOLN
0.0000 [IU] | Freq: Three times a day (TID) | SUBCUTANEOUS | Status: DC
  Administered 2014-11-02 (×2): 7 [IU] via SUBCUTANEOUS
  Administered 2014-11-02: 3 [IU] via SUBCUTANEOUS
  Administered 2014-11-03: 5 [IU] via SUBCUTANEOUS
  Administered 2014-11-03: 3 [IU] via SUBCUTANEOUS
  Administered 2014-11-03: 5 [IU] via SUBCUTANEOUS
  Administered 2014-11-04 (×2): 3 [IU] via SUBCUTANEOUS
  Administered 2014-11-04: 1 [IU] via SUBCUTANEOUS
  Administered 2014-11-05: 3 [IU] via SUBCUTANEOUS
  Administered 2014-11-05: 2 [IU] via SUBCUTANEOUS

## 2014-11-01 MED ORDER — ACETAMINOPHEN 325 MG PO TABS
650.0000 mg | ORAL_TABLET | Freq: Four times a day (QID) | ORAL | Status: DC | PRN
  Administered 2014-11-04 – 2014-11-05 (×3): 650 mg via ORAL
  Filled 2014-11-01 (×4): qty 2

## 2014-11-01 MED ORDER — PNEUMOCOCCAL VAC POLYVALENT 25 MCG/0.5ML IJ INJ
0.5000 mL | INJECTION | INTRAMUSCULAR | Status: AC
  Administered 2014-11-02: 0.5 mL via INTRAMUSCULAR
  Filled 2014-11-01: qty 0.5

## 2014-11-01 MED ORDER — SODIUM CHLORIDE 0.9 % IJ SOLN
3.0000 mL | Freq: Two times a day (BID) | INTRAMUSCULAR | Status: DC
  Administered 2014-11-01 – 2014-11-02 (×5): 3 mL via INTRAVENOUS

## 2014-11-01 MED ORDER — ALUM & MAG HYDROXIDE-SIMETH 200-200-20 MG/5ML PO SUSP: 30.0000 mL | Freq: Four times a day (QID) | ORAL | Status: DC | PRN

## 2014-11-01 MED ORDER — POTASSIUM CHLORIDE 10 MEQ/100ML IV SOLN
10.0000 meq | INTRAVENOUS | Status: AC
  Administered 2014-11-01 (×2): 10 meq via INTRAVENOUS
  Filled 2014-11-01 (×2): qty 100

## 2014-11-01 MED ORDER — HYDROMORPHONE HCL 1 MG/ML IJ SOLN: 0.5000 mg | INTRAMUSCULAR | Status: DC | PRN

## 2014-11-01 MED ORDER — SODIUM CHLORIDE 0.9 % IV SOLN
INTRAVENOUS | Status: DC
  Filled 2014-11-01: qty 2.5

## 2014-11-01 MED ORDER — LISINOPRIL 20 MG PO TABS
20.0000 mg | ORAL_TABLET | Freq: Every day | ORAL | Status: DC
  Filled 2014-11-01: qty 1

## 2014-11-01 MED ORDER — ACETAMINOPHEN 650 MG RE SUPP
650.0000 mg | Freq: Four times a day (QID) | RECTAL | Status: DC | PRN
Start: 2014-11-01 — End: 2014-11-05

## 2014-11-01 MED ORDER — INFLUENZA VAC SPLIT QUAD 0.5 ML IM SUSY
0.5000 mL | PREFILLED_SYRINGE | INTRAMUSCULAR | Status: AC
  Administered 2014-11-02: 0.5 mL via INTRAMUSCULAR
  Filled 2014-11-01: qty 0.5

## 2014-11-01 MED ORDER — OXYCODONE HCL 5 MG PO TABS
5.0000 mg | ORAL_TABLET | ORAL | Status: DC | PRN
  Administered 2014-11-05: 5 mg via ORAL
  Filled 2014-11-01: qty 1

## 2014-11-01 NOTE — Progress Notes (Addendum)
[facility] PROGRESS NOTE  Sheila Bishop CXK:[id number] DOB: 07-02-1971 DOA: 10/31/2014 PCP: No PCP Per Patient  Assessment/Plan: 1-sepsis;related to UTI.  Lactic acid has decrease to 1.1 from 5.5  Continue with IV fluids, IV antibiotics. Vancomycin and zosyn.  Influenza negative.  WBC trending down, still tachycardic.  Follow blood culture.   2-UTI; Continue with zosyn. Follow urine culture.   3-DKA; Gap close.  Transition from Insulin Gtt to lantus.  SSI.  Check Hb-A1c.   4-Bronchitis;  Influenza negative.  Continue with nebulizer.   5-Diabetes, uncontrolled. Lantus. SSI.   Code Status: Full Code.  Family Communication: Care discussed with patient.  Disposition Plan: remain in the step down. CM consul;ted. Need PCP. Assistance with medications.    Consultants:  none  Procedures:  none  Antibiotics:  Vancomycin 1-30  Zosyn 1-30  HPI/Subjective: Alert in no distress, feeling better.  Denies dysuria. Cough and dyspnea is better.   Objective: Filed Vitals:   11/01/14 0600  BP: 121/77  Pulse: 102  Temp:   Resp: 20    Intake/Output Summary (Last 24 hours) at 11/01/14 0725 Last data filed at 11/01/14 0700  Gross per 24 hour  Intake  566.7 ml  Output    550 ml  Net   16.7 ml   Filed Weights   11/01/14 0110  Weight: 91.5 kg (201 lb 11.5 oz)    Exam:   General:  Alert in no distress.   Cardiovascular: S 1, S 2 RRR  Respiratory: Bilateral ronchus  Abdomen: BS present, soft, nt  Musculoskeletal: no edema.   Data Reviewed: Basic Metabolic Panel:  Recent Labs Lab 10/25/14 1244 10/31/14 2015 11/01/14 0034 11/01/14 0315  NA 136 129* 133* 136  K 3.2* 3.7 3.6 3.6  CL 101 93* 100 103  CO2 28 21 25 26   GLUCOSE 266* 593* 402* 249*  BUN 7 7 6  <5*  CREATININE 0.54 0.87 0.56 0.62  CALCIUM 8.9 9.0 8.0* 8.1*   Liver Function Tests: No results for input(s): AST, ALT, ALKPHOS, BILITOT, PROT, ALBUMIN in the last 168 hours. No  results for input(s): LIPASE, AMYLASE in the last 168 hours. No results for input(s): AMMONIA in the last 168 hours. CBC:  Recent Labs Lab 10/25/14 1244 10/31/14 2015 11/01/14 0315  WBC 19.5* 22.7* 19.6*  HGB 11.2* 11.8* 10.8*  HCT 33.0* 34.6* 32.3*  MCV 78.2 77.1* 79.0  PLT 281 417* 340   Cardiac Enzymes: No results for input(s): CKTOTAL, CKMB, CKMBINDEX, TROPONINI in the last 168 hours. BNP (last 3 results) No results for input(s): PROBNP in the last 8760 hours. CBG:  Recent Labs Lab 10/31/14 2317 11/01/14 0034  GLUCAP 416* 366*    Recent Results (from the past 240 hour(s))  MRSA PCR Screening     Status: None   Collection Time: 11/01/14  1:05 AM  Result Value Ref Range Status   MRSA by PCR NEGATIVE NEGATIVE Final    Comment:        The GeneXpert MRSA Assay (FDA approved for NASAL specimens only), is one component of a comprehensive MRSA colonization surveillance program. It is not intended to diagnose MRSA infection nor to guide or monitor treatment for MRSA infections.      Studies: Dg Chest Port 1 View  10/31/2014   CLINICAL DATA:  Cough and difficulty breathing ; fatigue  EXAM: PORTABLE CHEST - 1 VIEW  COMPARISON:  Chest radiograph August 02, 2011 and chest CT October 25, 2014  FINDINGS: There is no edema  or consolidation. Heart is upper normal in size with pulmonary vascularity within normal limits. No adenopathy. No pneumothorax. No bone lesions.  IMPRESSION: No edema or consolidation.   Electronically Signed   By: Lowella Grip M.D.   On: 10/31/2014 21:27    Scheduled Meds: . sodium chloride   Intravenous STAT  . enoxaparin (LOVENOX) injection  40 mg Subcutaneous Q24H  . [START ON 11/02/2014] Influenza vac split quadrivalent PF  0.5 mL Intramuscular Tomorrow-1000  . lisinopril  20 mg Oral Daily  . piperacillin-tazobactam (ZOSYN)  IV  3.375 g Intravenous Q8H  . [START ON 11/02/2014] pneumococcal 23 valent vaccine  0.5 mL Intramuscular Tomorrow-1000   . sodium chloride  3 mL Intravenous Q12H  . vancomycin  1,000 mg Intravenous Q8H   Continuous Infusions: . sodium chloride Stopped (11/01/14 0300)  . dextrose 5 % and 0.45% NaCl 100 mL/hr at 11/01/14 0259  . insulin (NOVOLIN-R) infusion 5.4 Units/hr (11/01/14 0998)    Principal Problem:   Sepsis Active Problems:   Hyperglycemia   DKA, type 2   Hypertension   UTI (lower urinary tract infection)   Hyponatremia   Tachycardia    Time spent: 35 minutes.     Niel Hummer A  [facility] Pager [phone]. If 7PM-7AM, please contact night-coverage at [email or web address], password [facility] 11/01/2014, 7:25 AM  LOS: 1 day

## 2014-11-01 NOTE — Progress Notes (Signed)
Patient having bloody urinary output.  Initially, she thought she had started her menstrual cycle, however MD was notified and no new orders were given. Patient was provided with feminine hygiene products and monitored.  Since that time, the patient has had several more episodes of bloody urine accompanied by mild abdominal cramping and has not had any menstrual type bleeding on feminine pad.  MD notified again at this time.  Patient appears to be comfortable and resting; all vital signs are stable at this time.  Will continue to monitor.

## 2014-11-01 NOTE — H&P (Signed)
[facility] Admission History and Physical       KLYN KROENING TFT:[id number] DOB: 1970/10/18 DOA: 10/31/2014  Referring physician: EDP PCP: No PCP Per Patient  Specialists:   Chief Complaint: Fever  HPI: Sheila Bishop is a 44 y.o. female with a history of DM2, HTN out of her medications x several months who presents to the ED with complaints of malaise and polyuria and weakness and fevers and chills.  She had been seen in the ED on 01/24 for acute bronchitis and was placed on prednisone she took 2 days of it but it made her fell funny and she was not able to work, so she stopped taking it.   In the ED tonight her glucose was found to be 593 and she was found to be in DKA.   A UA was performed and also found to be positive .  A Lactate level returned at 5.51.  She was placed on the DKA protocol and cultures were sent, and she was started on IV Vancomycin and Zosyn.       Review of Systems:  Constitutional: No Weight Loss, No Weight Gain, Night Sweats, +Fevers +Chills, +Dizziness, +Fatigue, +Generalized Weakness HEENT: No Headaches, Difficulty Swallowing,Tooth/Dental Problems,Sore Throat,  No Sneezing, Rhinitis, Ear Ache, Nasal Congestion, or Post Nasal Drip,  Cardio-vascular:  No Chest pain, Orthopnea, PND, Edema in Lower Extremities, Anasarca, Dizziness, Palpitations  Resp: No Dyspnea, No DOE, No Productive Cough, No Non-Productive Cough, No Hemoptysis, No Wheezing.    GI: No Heartburn, Indigestion, Abdominal Pain, Nausea, Vomiting, Diarrhea, Hematemesis, Hematochezia, Melena, Change in Bowel Habits,  Loss of Appetite  GU: No Dysuria, Change in Color of Urine,+Urinary Frequency, No Flank pain.  Musculoskeletal: No Joint Pain or Swelling, No Decreased Range of Motion, No Back Pain.  Neurologic: No Syncope, No Seizures, Muscle Weakness, Paresthesia, Vision Disturbance or Loss, No Diplopia, No Vertigo, No Difficulty Walking,  Skin: No Rash or Lesions. Psych: No Change in  Mood or Affect, No Depression or Anxiety, No Memory loss, No Confusion, or Hallucinations   Past Medical History  Diagnosis Date  . Breast abscess     left  . Diabetes mellitus   . Hypertension   . Headache(784.0)   . Depression   . Wears glasses   . Obesity      Past Surgical History  Procedure Laterality Date  . Breast biopsy  08/07/2011    Procedure: BREAST BIOPSY;  Surgeon: Joyice Faster. Cornett, MD;  Location: [facility];  Service: General;  Laterality: Left;  BIOPSY BREAST LEFT SIDE  . Cesarean section  2002       Prior to Admission medications   Medication Sig Start Date End Date Taking? Authorizing Provider  lisinopril (PRINIVIL,ZESTRIL) 20 MG tablet Take 20 mg by mouth daily.      Historical Provider, MD  predniSONE (DELTASONE) 20 MG tablet Take 2 tablets (40 mg total) by mouth daily. Patient not taking: Reported on 10/31/2014 10/25/14   Virgel Manifold, MD      No Known Allergies   Social History:  reports that she quit smoking about 6 years ago. She does not have any smokeless tobacco history on file. She reports that she does not drink alcohol or use illicit drugs.     History reviewed. No pertinent family history.     Physical Exam:  GEN:  Pleasant Obese 45 y.o. African American female examined  and in no acute distress; cooperative with exam Filed Vitals:   10/31/14 2230 10/31/14 2245  10/31/14 2300 11/01/14 0000  BP: 123/77 124/89 128/76 111/74  Pulse: 110 121 113 115  Temp:      TempSrc:      Resp: 21 34 34 19  SpO2: 98% 99% 98% 98%   Blood pressure 111/74, pulse 115, temperature 100.2 F (37.9 C), temperature source Rectal, resp. rate 19, last menstrual period 10/19/2014, SpO2 98 %. PSYCH: SHe is alert and oriented x4; does not appear anxious does not appear depressed; affect is normal HEENT: Normocephalic and Atraumatic, Mucous membranes pink; PERRLA; EOM intact; Fundi:  Benign;  No scleral icterus, Nares: Patent, Oropharynx: Clear, Fair Dentition,     Neck:  FROM, No Cervical Lymphadenopathy nor Thyromegaly or Carotid Bruit; No JVD; Breasts:: Not examined CHEST WALL: No tenderness CHEST: Normal respiration, clear to auscultation bilaterally HEART: Regular rate and rhythm; no murmurs rubs or gallops BACK: No kyphosis or scoliosis; No CVA tenderness ABDOMEN: Positive Bowel Sounds, Obese, Soft Non-Tender; No Masses, No Organomegaly Rectal Exam: Not done EXTREMITIES: No Cyanosis, Clubbing, or Edema; No Ulcerations. Genitalia: not examined PULSES: 2+ and symmetric SKIN: Normal hydration no rash or ulceration CNS:  Alert and Oriented x 4, No Focal Deficits Vascular: pulses palpable throughout    Labs on Admission:  Basic Metabolic Panel:  Recent Labs Lab 10/25/14 1244 10/31/14 2015  NA 136 129*  K 3.2* 3.7  CL 101 93*  CO2 28 21  GLUCOSE 266* 593*  BUN 7 7  CREATININE 0.54 0.87  CALCIUM 8.9 9.0   Liver Function Tests: No results for input(s): AST, ALT, ALKPHOS, BILITOT, PROT, ALBUMIN in the last 168 hours. No results for input(s): LIPASE, AMYLASE in the last 168 hours. No results for input(s): AMMONIA in the last 168 hours. CBC:  Recent Labs Lab 10/25/14 1244 10/31/14 2015  WBC 19.5* 22.7*  HGB 11.2* 11.8*  HCT 33.0* 34.6*  MCV 78.2 77.1*  PLT 281 417*   Cardiac Enzymes: No results for input(s): CKTOTAL, CKMB, CKMBINDEX, TROPONINI in the last 168 hours.  BNP (last 3 results) No results for input(s): PROBNP in the last 8760 hours. CBG:  Recent Labs Lab 10/31/14 2317  GLUCAP 416*    Radiological Exams on Admission: Dg Chest Port 1 View  10/31/2014   CLINICAL DATA:  Cough and difficulty breathing ; fatigue  EXAM: PORTABLE CHEST - 1 VIEW  COMPARISON:  Chest radiograph August 02, 2011 and chest CT October 25, 2014  FINDINGS: There is no edema or consolidation. Heart is upper normal in size with pulmonary vascularity within normal limits. No adenopathy. No pneumothorax. No bone lesions.  IMPRESSION: No edema  or consolidation.   Electronically Signed   By: Lowella Grip M.D.   On: 10/31/2014 21:27      Assessment/Plan:     44 y.o. female with  Principal Problem:  1.   Sepsis   IV Vancomycin and Zosyn   .   Active Problems:  2.   DKA, type 2/Hyperglycemia   DKA Protocol    3..   UTI (lower urinary tract infection)   Covered by Abxs    4    Hypertension   Restart Lisinopril    Monitor BPs      5.   DVT prophylaxis    Lovenox    Code Status:     FULL CODE  Family Communication:   Husband at Bedside  Disposition Plan:   Inpatient      Time spent: [place] [facility] Pager [phone]  If 7AM -7PM Please Contact the Day Rounding Team MD for [facility]  If 7PM-7AM, Please Contact Night-Floor Coverage  [email or web address] Password [facility] 11/01/2014, 12:23 AM

## 2014-11-01 NOTE — Progress Notes (Addendum)
CRITICAL VALUE ALERT  Critical value received:  Blood culture: Gram negative rods  Date of notification:  11/01/14  Time of notification:  2209  Critical value read back:  YES  Nurse who received alert:  Sherryl Manges  MD notified (1st page):  Rogue Bussing  Time of first page:  2303  MD notified (2nd page):  Time of second page:  Responding MD:  Rogue Bussing  Time MD responded:  2315

## 2014-11-02 DIAGNOSIS — A419 Sepsis, unspecified organism: Principal | ICD-10-CM

## 2014-11-02 DIAGNOSIS — E1100 Type 2 diabetes mellitus with hyperosmolarity without nonketotic hyperglycemic-hyperosmolar coma (NKHHC): Secondary | ICD-10-CM | POA: Insufficient documentation

## 2014-11-02 DIAGNOSIS — IMO0001 Reserved for inherently not codable concepts without codable children: Secondary | ICD-10-CM | POA: Insufficient documentation

## 2014-11-02 LAB — BASIC METABOLIC PANEL
ANION GAP: 6 (ref 5–15)
BUN: <5 mg/dL — ABNORMAL LOW (ref 6–23)
CHLORIDE: 101 mmol/L (ref 96–112)
CO2: 26 mmol/L (ref 19–32)
Calcium: 8.3 mg/dL — ABNORMAL LOW (ref 8.4–10.5)
Creatinine, Ser: 0.54 mg/dL (ref 0.50–1.10)
GFR calc Af Amer: >90 mL/min (ref >90)
Glucose, Bld: 298 mg/dL — ABNORMAL HIGH (ref 70–99)
Potassium: 4 mmol/L (ref 3.5–5.1)
Sodium: 133 mmol/L — ABNORMAL LOW (ref 135–145)

## 2014-11-02 LAB — CBC
HEMATOCRIT: 29.9 % — AB (ref 36.0–46.0)
Hemoglobin: 10.4 g/dL — ABNORMAL LOW (ref 12.0–15.0)
MCH: 27.6 pg (ref 26.0–34.0)
MCHC: 34.8 g/dL (ref 30.0–36.0)
MCV: 79.3 fL (ref 78.0–100.0)
PLATELETS: 337 10*3/uL (ref 150–400)
RBC: 3.77 MIL/uL — AB (ref 3.87–5.11)
RDW: 13 % (ref 11.5–15.5)
WBC: 18.4 10*3/uL — ABNORMAL HIGH (ref 4.0–10.5)

## 2014-11-02 LAB — URINALYSIS, ROUTINE W REFLEX MICROSCOPIC
Bilirubin Urine: NEGATIVE
Glucose, UA: >1000 mg/dL — AB
KETONES UR: NEGATIVE mg/dL
NITRITE: NEGATIVE
PH: 6.5 (ref 5.0–8.0)
PROTEIN: NEGATIVE mg/dL
Specific Gravity, Urine: 1.016 (ref 1.005–1.030)
Urobilinogen, UA: 4 mg/dL — ABNORMAL HIGH (ref 0.0–1.0)

## 2014-11-02 LAB — GLUCOSE, CAPILLARY
GLUCOSE-CAPILLARY: 239 mg/dL — AB (ref 70–99)
GLUCOSE-CAPILLARY: 125 mg/dL — AB (ref 70–99)
GLUCOSE-CAPILLARY: 321 mg/dL — AB (ref 70–99)
GLUCOSE-CAPILLARY: 346 mg/dL — AB (ref 70–99)
Glucose-Capillary: 329 mg/dL — ABNORMAL HIGH (ref 70–99)
Glucose-Capillary: 216 mg/dL — ABNORMAL HIGH (ref 70–99)
Glucose-Capillary: 194 mg/dL — ABNORMAL HIGH (ref 70–99)
Glucose-Capillary: 167 mg/dL — ABNORMAL HIGH (ref 70–99)
Glucose-Capillary: 238 mg/dL — ABNORMAL HIGH (ref 70–99)
Glucose-Capillary: 256 mg/dL — ABNORMAL HIGH (ref 70–99)

## 2014-11-02 LAB — URINE MICROSCOPIC-ADD ON

## 2014-11-02 LAB — HEMOGLOBIN A1C
Hgb A1c MFr Bld: 11.3 % — ABNORMAL HIGH (ref 4.8–5.6)
MEAN PLASMA GLUCOSE: 278 mg/dL

## 2014-11-02 MED ORDER — LIVING WELL WITH DIABETES BOOK
Freq: Once | Status: AC
  Administered 2014-11-02: 16:00:00
  Filled 2014-11-02: qty 1

## 2014-11-02 MED ORDER — INSULIN GLARGINE 100 UNIT/ML ~~LOC~~ SOLN
30.0000 [IU] | Freq: Every day | SUBCUTANEOUS | Status: DC
  Administered 2014-11-02: 30 [IU] via SUBCUTANEOUS
  Filled 2014-11-02 (×2): qty 0.3

## 2014-11-02 NOTE — Progress Notes (Signed)
Inpatient Diabetes Program Recommendations  AACE/ADA: New Consensus Statement on Inpatient Glycemic Control (2013)  Target Ranges:  Prepandial:   less than 140 mg/dL      Peak postprandial:   less than 180 mg/dL (1-2 hours)      Critically ill patients:  140 - 180 mg/dL   Reason for Visit:  Patient admitted with Hyperglycemia. Results for HIEN, CUNLIFFE (MRN [id number]) as of 11/02/2014 15:26  Ref. Range 11/01/2014 00:34 11/01/2014 12:32 11/01/2014 16:11 11/01/2014 20:47 11/02/2014 12:25  Glucose-Capillary Latest Range: 70-99 mg/dL 366 (H) 242 (H) 300 (H) 334 (H) 238 (H)  Results for BABBIE, DONDLINGER (MRN [id number]) as of 11/02/2014 15:26  Ref. Range 11/01/2014 03:15  Hgb A1c MFr Bld Latest Range: 4.8-5.6 % 11.3 (H)    Diabetes history: Type 2 diabetes Outpatient Diabetes medications: Patient states that she used to take Glipizide.  States that Metformin hurt her stomach so she could not take.  She admits that she has not taken her diabetes medications in over 5 months. Current orders for Inpatient glycemic control:  Lantus 30 units daily, Novolog sensitive tid with meals, Novolog 4 units tid with meals  Spoke with patient regarding diabetes.  She states "I am going to take care of myself".  She admits that she has not taken her medicine in over 5 months for her diabetes.  She used to go to the MD at [facility] serve.  Case manager has already given her information regarding the [place] and orange card.  Patient works at [employer] and lives with her grandmother. She states that if she needs insulin, her grandmother will be able to help her administer.   Patient seems motivated to make changes.  Will order "Living Well with Diabetes" booklet for patient and follow while in the hospital.  Thanks, Adah Perl, RN, BC-ADM Inpatient Diabetes Coordinator Pager [phone]

## 2014-11-02 NOTE — Progress Notes (Signed)
[facility] PROGRESS NOTE  Sheila Bishop Sanford RKY:[id number] DOB: 08-Nov-1970 DOA: 10/31/2014 PCP: No PCP Per Patient  Assessment/Plan: 1-Sepsis;related to UTI. Bacteremia; Lactic acid has decrease to 1.1 from 5.5  Continue with IV fluids, IV antibiotics. Vancomycin and zosyn day 3.  Influenza negative.  WBC trending down.  blood culture one of two growing Gram negative Rods. Follow final report.   2-UTI; Continue with zosyn. Follow urine culture.   3-DKA; Gap close.  Transition from Insulin Gtt to lantus.  SSI. Lantus increase to 30 units on 2-1.  Hb-A1c pending.   4-Bronchitis;  Influenza negative.  Continue with nebulizer.  Her dry cough might be related to ACE. ?   5-Diabetes, uncontrolled. Lantus. SSI.  6-Hematuria: Lovenox discontinue. Repeat UA. Might be related to infection.  7-Anemia; check anemia panel.   Code Status: Full Code.  Family Communication: Care discussed with patient.  Disposition Plan: Transfer to regular bed. CM consul;ted. Need PCP.   Consultants:  none  Procedures:  none  Antibiotics:  Vancomycin 1-30  Zosyn 1-30  HPI/Subjective: She is feeling better, no dizziness.  She relates blood in the urine.   Objective: Filed Vitals:   11/02/14 0726  BP: 129/85  Pulse: 92  Temp: 98.4 F (36.9 C)  Resp: 20    Intake/Output Summary (Last 24 hours) at 11/02/14 0802 Last data filed at 11/02/14 3151  Gross per 24 hour  Intake   2593 ml  Output   5600 ml  Net  -3007 ml   Filed Weights   11/01/14 0110 11/02/14 0400  Weight: 91.5 kg (201 lb 11.5 oz) 93.7 kg (206 lb 9.1 oz)    Exam:   General:  Alert in no distress.   Cardiovascular: S 1, S 2 RRR  Respiratory: Bilateral ronchus  Abdomen: BS present, soft, nt  Musculoskeletal: no edema.   Data Reviewed: Basic Metabolic Panel:  Recent Labs Lab 10/31/14 2015 11/01/14 0034 11/01/14 0315 11/01/14 0802 11/02/14 0357  NA 129* 133* 136 136 133*  K 3.7 3.6 3.6 3.3*  4.0  CL 93* 100 103 102 101  CO2 21 25 26 26 26   GLUCOSE 593* 402* 249* 131* 298*  BUN 7 6 <5* <5* <5*  CREATININE 0.87 0.56 0.62 0.53 0.54  CALCIUM 9.0 8.0* 8.1* 8.1* 8.3*   Liver Function Tests: No results for input(s): AST, ALT, ALKPHOS, BILITOT, PROT, ALBUMIN in the last 168 hours. No results for input(s): LIPASE, AMYLASE in the last 168 hours. No results for input(s): AMMONIA in the last 168 hours. CBC:  Recent Labs Lab 10/31/14 2015 11/01/14 0315 11/02/14 0357  WBC 22.7* 19.6* 18.4*  HGB 11.8* 10.8* 10.4*  HCT 34.6* 32.3* 29.9*  MCV 77.1* 79.0 79.3  PLT 417* 340 337   Cardiac Enzymes: No results for input(s): CKTOTAL, CKMB, CKMBINDEX, TROPONINI in the last 168 hours. BNP (last 3 results) No results for input(s): PROBNP in the last 8760 hours. CBG:  Recent Labs Lab 10/31/14 2317 11/01/14 0034 11/01/14 1232 11/01/14 1611 11/01/14 2047  GLUCAP 416* 366* 242* 300* 334*    Recent Results (from the past 240 hour(s))  Blood Culture (routine x 2)     Status: None (Preliminary result)   Collection Time: 10/31/14  9:18 PM  Result Value Ref Range Status   Specimen Description BLOOD LEFT ARM  Final   Special Requests BOTTLES DRAWN AEROBIC AND ANAEROBIC 10CC  Final   Culture   Final    GRAM NEGATIVE RODS Note: Gram Stain Report Called  to,Read Back By and Verified With: MISTY E RN @1005PM  VINCJ 11/01/14 Performed at [facility]    Report Status PENDING  Incomplete  MRSA PCR Screening     Status: None   Collection Time: 11/01/14  1:05 AM  Result Value Ref Range Status   MRSA by PCR NEGATIVE NEGATIVE Final    Comment:        The GeneXpert MRSA Assay (FDA approved for NASAL specimens only), is one component of a comprehensive MRSA colonization surveillance program. It is not intended to diagnose MRSA infection nor to guide or monitor treatment for MRSA infections.      Studies: Dg Chest Port 1 View  10/31/2014   CLINICAL DATA:  Cough and  difficulty breathing ; fatigue  EXAM: PORTABLE CHEST - 1 VIEW  COMPARISON:  Chest radiograph August 02, 2011 and chest CT October 25, 2014  FINDINGS: There is no edema or consolidation. Heart is upper normal in size with pulmonary vascularity within normal limits. No adenopathy. No pneumothorax. No bone lesions.  IMPRESSION: No edema or consolidation.   Electronically Signed   By: Lowella Grip M.D.   On: 10/31/2014 21:27    Scheduled Meds: . Influenza vac split quadrivalent PF  0.5 mL Intramuscular Tomorrow-1000  . insulin aspart  0-5 Units Subcutaneous QHS  . insulin aspart  0-9 Units Subcutaneous TID WC  . insulin aspart  4 Units Subcutaneous TID WC  . insulin glargine  25 Units Subcutaneous Daily  . piperacillin-tazobactam (ZOSYN)  IV  3.375 g Intravenous Q8H  . pneumococcal 23 valent vaccine  0.5 mL Intramuscular Tomorrow-1000  . sodium chloride  3 mL Intravenous Q12H  . vancomycin  1,000 mg Intravenous Q8H   Continuous Infusions: . sodium chloride 100 mL/hr at 11/02/14 0351    Principal Problem:   Sepsis Active Problems:   Hyperglycemia   DKA, type 2   Hypertension   UTI (lower urinary tract infection)   Hyponatremia   Tachycardia    Time spent: 35 minutes.     Niel Hummer A  [facility] Pager [phone]. If 7PM-7AM, please contact night-coverage at [email or web address], password [facility] 11/02/2014, 8:02 AM  LOS: 2 days

## 2014-11-02 NOTE — Progress Notes (Signed)
Utilization Review Completed.  

## 2014-11-02 NOTE — Care Management Note (Addendum)
    Page 1 of 2   11/05/2014     6:08:09 PM CARE MANAGEMENT NOTE 11/05/2014  Patient:  Sheila Bishop, Sheila Bishop   Account Number:  [id number]  Date Initiated:  11/02/2014  Documentation initiated by:  MAYO,HENRIETTA  Subjective/Objective Assessment:   dx sepsis     Action/Plan:   Anticipated DC Date:  11/05/2014   Anticipated DC Plan:  [place]  CM consult      Choice offered to / List presented to:             Status of service:  Completed, signed off Medicare Important Message given?  NO (If response is "NO", the following Medicare IM given date fields will be blank) Date Medicare IM given:   Medicare IM given by:   Date Additional Medicare IM given:   Additional Medicare IM given by:    Discharge Disposition:  HOME/SELF CARE  Per UR Regulation:  Reviewed for med. necessity/level of care/duration of stay  If discussed at [place] of Stay Meetings, dates discussed:    Comments:  11/05/14 [place], BSN [phone] patient request help in getting pcp,  she states she has insurance, NCM made copy of insurance card took to admissions, she has [place].  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  Patient states this is to far for her because she has no transportation.  NCM informed her that her co pay to see another MD will be more , is that ok , she states yes, NCM made apt with [facility] which patient can catch the bus here if she does not have transportation.  Also patient will be on insulin at dc , NCM did benefit check on lantus pen which co pay is 387.10 , vial is 258.43, novolog pen is 474.44 and vial is 246.20. Also the levemir pen is 418.93 and humalog is 475.01. Patient has a deductible of 5500 which has not been met, this is why co pay is so high.  Patient can not afford this, NCM informed MD, he decided to let patient  try 70/30 novilin at [facility] for 26.00, but patient states she can not see the numbers on the syringe so she will not be using insulin.  NCM informed MD he decided to put patient on po meds for DM.  This information was given to RN Delcine.  11/02/14 [place] MSN BSN CCM Pt works part-time so has had no insurance through her employer but signed up and has her own policy as of this date.  Requests information about [place] - provided handout with contact information.  Also explained process to apply for orange card @ the clinic so medications could be obtained with small or no copay.  Patient demonstrates understanding, plans to call for appointment.

## 2014-11-02 NOTE — Progress Notes (Signed)
Report called to Denyse Amass, Therapist, sports.  Updated on patient history, current status, and plan of care.  Patient is stable and able to transfer at this time.

## 2014-11-03 DIAGNOSIS — R7881 Bacteremia: Secondary | ICD-10-CM

## 2014-11-03 LAB — CBC
HEMATOCRIT: 29.9 % — AB (ref 36.0–46.0)
HEMOGLOBIN: 10 g/dL — AB (ref 12.0–15.0)
MCH: 26.5 pg (ref 26.0–34.0)
MCHC: 33.4 g/dL (ref 30.0–36.0)
MCV: 79.3 fL (ref 78.0–100.0)
Platelets: 354 10*3/uL (ref 150–400)
RBC: 3.77 MIL/uL — ABNORMAL LOW (ref 3.87–5.11)
RDW: 13 % (ref 11.5–15.5)
WBC: 17.3 10*3/uL — ABNORMAL HIGH (ref 4.0–10.5)

## 2014-11-03 LAB — CULTURE, BLOOD (ROUTINE X 2)

## 2014-11-03 LAB — BASIC METABOLIC PANEL
Anion gap: 10 (ref 5–15)
BUN: <5 mg/dL — ABNORMAL LOW (ref 6–23)
CO2: 23 mmol/L (ref 19–32)
CREATININE: 0.5 mg/dL (ref 0.50–1.10)
Calcium: 8.3 mg/dL — ABNORMAL LOW (ref 8.4–10.5)
Chloride: 102 mmol/L (ref 96–112)
GFR calc Af Amer: >90 mL/min (ref >90)
GFR calc non Af Amer: >90 mL/min (ref >90)
GLUCOSE: 192 mg/dL — AB (ref 70–99)
POTASSIUM: 3.8 mmol/L (ref 3.5–5.1)
SODIUM: 135 mmol/L (ref 135–145)

## 2014-11-03 LAB — GLUCOSE, CAPILLARY
GLUCOSE-CAPILLARY: 219 mg/dL — AB (ref 70–99)
GLUCOSE-CAPILLARY: 286 mg/dL — AB (ref 70–99)
Glucose-Capillary: 286 mg/dL — ABNORMAL HIGH (ref 70–99)
Glucose-Capillary: 269 mg/dL — ABNORMAL HIGH (ref 70–99)

## 2014-11-03 LAB — URINE CULTURE

## 2014-11-03 MED ORDER — INSULIN STARTER KIT- SYRINGES (ENGLISH)
1.0000 | Freq: Once | Status: AC
  Administered 2014-11-03: 1
  Filled 2014-11-03: qty 1

## 2014-11-03 MED ORDER — CEFTRIAXONE SODIUM IN DEXTROSE 20 MG/ML IV SOLN
1.0000 g | INTRAVENOUS | Status: DC
  Administered 2014-11-03 – 2014-11-05 (×3): 1 g via INTRAVENOUS
  Filled 2014-11-03 (×3): qty 50

## 2014-11-03 MED ORDER — INSULIN GLARGINE 100 UNIT/ML ~~LOC~~ SOLN
35.0000 [IU] | Freq: Every day | SUBCUTANEOUS | Status: DC
  Administered 2014-11-03 – 2014-11-04 (×2): 35 [IU] via SUBCUTANEOUS
  Filled 2014-11-03 (×2): qty 0.35

## 2014-11-03 NOTE — Progress Notes (Signed)
[facility] PROGRESS NOTE  Sheila Bishop IWL:[id number] DOB: August 16, 1971 DOA: 10/31/2014 PCP: No PCP Per Patient  Assessment/Plan: 1-Sepsis;related to E. Coli UTI and E. Coli Bacteremia; Lactic acid has decrease to 1.1  Continue with IV fluids, IV antibiotics. Vancomycin and zosyn for 3 days; plan is for 5 days IV (day 3/5) and then complete a total of 14 days of abx's.  Influenza negative.  WBC trending down and patient afebrile.   2-E. Coli UTI; Following sensitivity will switch to rocephin  Per ID as mentioned above, treat for 5 days IV and then 9 days of levaquin at discharge  3-DKA; Gap close and CBG's under better control.  Continue SSI and adjust lantus for improvement on CBG's  Discussed low carb diet and weight loss with patient Hb-A1c 11.3  4-Bronchitis;  Influenza negative.  Continue with PRN nebulizer and antitussives .  Her dry cough might be related to ACE. ?   5-Diabetes, uncontrolled. Lantus increased to 35 units for better control. Continue SSI.  6-Hematuria:  related to infection. Improving and clearing according to patient 7-Anemia; Hgb stable. -anticoagulation discontinue due to hematuria -follow anemia panel   Code Status: Full Code.  Family Communication: Care discussed with patient.  Disposition Plan: Transfer to regular bed. CM consul;ted. Need PCP.   Consultants:  ID curbside (Dr. Johnnye Sima)  Procedures:  none  Antibiotics:  Vancomycin 1-30>>2/2  Zosyn 1-30>>2/2  Rocephin 2/2  HPI/Subjective: Feeling better, no CP, no SOB and no nausea or vomiting. Patient still with mild hematuria  Objective: Filed Vitals:   11/03/14 1305  BP: 124/80  Pulse: 95  Temp: 97.8 F (36.6 C)  Resp: 16    Intake/Output Summary (Last 24 hours) at 11/03/14 1931 Last data filed at 11/03/14 1828  Gross per 24 hour  Intake 3416.67 ml  Output   3200 ml  Net 216.67 ml   Filed Weights   11/01/14 0110 11/02/14 0400  Weight: 91.5 kg (201 lb  11.5 oz) 93.7 kg (206 lb 9.1 oz)    Exam:   General:  Alert in no distress; feeling better and complaining of mild hematuria and frequency   Cardiovascular: S 1, S 2 RRR  Respiratory: good air movement, no wheezing  Abdomen: BS present, soft, nt  Musculoskeletal: no edema.   Data Reviewed: Basic Metabolic Panel:  Recent Labs Lab 11/01/14 0034 11/01/14 0315 11/01/14 0802 11/02/14 0357 11/03/14 0527  NA 133* 136 136 133* 135  K 3.6 3.6 3.3* 4.0 3.8  CL 100 103 102 101 102  CO2 25 26 26 26 23   GLUCOSE 402* 249* 131* 298* 192*  BUN 6 <5* <5* <5* <5*  CREATININE 0.56 0.62 0.53 0.54 0.50  CALCIUM 8.0* 8.1* 8.1* 8.3* 8.3*   CBC:  Recent Labs Lab 10/31/14 2015 11/01/14 0315 11/02/14 0357 11/03/14 0527  WBC 22.7* 19.6* 18.4* 17.3*  HGB 11.8* 10.8* 10.4* 10.0*  HCT 34.6* 32.3* 29.9* 29.9*  MCV 77.1* 79.0 79.3 79.3  PLT 417* 340 337 354   CBG:  Recent Labs Lab 11/02/14 1652 11/02/14 2114 11/03/14 0746 11/03/14 1159 11/03/14 1636  GLUCAP 346* 256* 219* 286* 269*    Recent Results (from the past 240 hour(s))  Blood Culture (routine x 2)     Status: None (Preliminary result)   Collection Time: 10/31/14  9:16 PM  Result Value Ref Range Status   Specimen Description BLOOD RIGHT ARM  Final   Special Requests BOTTLES DRAWN AEROBIC AND ANAEROBIC 5CC  Final   Culture  Final           BLOOD CULTURE RECEIVED NO GROWTH TO DATE CULTURE WILL BE HELD FOR 5 DAYS BEFORE ISSUING A FINAL NEGATIVE REPORT Performed at [facility]    Report Status PENDING  Incomplete  Blood Culture (routine x 2)     Status: None   Collection Time: 10/31/14  9:18 PM  Result Value Ref Range Status   Specimen Description BLOOD LEFT ARM  Final   Special Requests BOTTLES DRAWN AEROBIC AND ANAEROBIC 10CC  Final   Culture   Final    ESCHERICHIA COLI Note: Gram Stain Report Called to,Read Back By and Verified With: MISTY E RN @1005PM  VINCJ 11/01/14 Performed at [facility]     Report Status 11/03/2014 FINAL  Final   Organism ID, Bacteria ESCHERICHIA COLI  Final      Susceptibility   Escherichia coli - MIC*    AMPICILLIN >=32 RESISTANT Resistant     AMPICILLIN/SULBACTAM 16 INTERMEDIATE Intermediate     CEFAZOLIN <=4 SENSITIVE Sensitive     CEFEPIME <=1 SENSITIVE Sensitive     CEFTAZIDIME <=1 SENSITIVE Sensitive     CEFTRIAXONE <=1 SENSITIVE Sensitive     CIPROFLOXACIN <=0.25 SENSITIVE Sensitive     GENTAMICIN <=1 SENSITIVE Sensitive     IMIPENEM <=0.25 SENSITIVE Sensitive     PIP/TAZO <=4 SENSITIVE Sensitive     TOBRAMYCIN <=1 SENSITIVE Sensitive     TRIMETH/SULFA <=20 SENSITIVE Sensitive     * ESCHERICHIA COLI  Urine culture     Status: None   Collection Time: 10/31/14  9:28 PM  Result Value Ref Range Status   Specimen Description URINE, CLEAN CATCH  Final   Special Requests NONE  Final   Colony Count   Final    >=100,000 COLONIES/ML Performed at [facility]    Culture   Final    ESCHERICHIA COLI Performed at [facility]    Report Status 11/03/2014 FINAL  Final   Organism ID, Bacteria ESCHERICHIA COLI  Final      Susceptibility   Escherichia coli - MIC*    AMPICILLIN >=32 RESISTANT Resistant     CEFAZOLIN <=4 SENSITIVE Sensitive     CEFTRIAXONE <=1 SENSITIVE Sensitive     CIPROFLOXACIN <=0.25 SENSITIVE Sensitive     GENTAMICIN <=1 SENSITIVE Sensitive     LEVOFLOXACIN <=0.12 SENSITIVE Sensitive     NITROFURANTOIN <=16 SENSITIVE Sensitive     TOBRAMYCIN <=1 SENSITIVE Sensitive     TRIMETH/SULFA <=20 SENSITIVE Sensitive     PIP/TAZO <=4 SENSITIVE Sensitive     * ESCHERICHIA COLI  MRSA PCR Screening     Status: None   Collection Time: 11/01/14  1:05 AM  Result Value Ref Range Status   MRSA by PCR NEGATIVE NEGATIVE Final    Comment:        The GeneXpert MRSA Assay (FDA approved for NASAL specimens only), is one component of a comprehensive MRSA colonization surveillance program. It is not intended to diagnose  MRSA infection nor to guide or monitor treatment for MRSA infections.      Studies: No results found.  Scheduled Meds: . cefTRIAXone (ROCEPHIN)  IV  1 g Intravenous Q24H  . insulin aspart  0-5 Units Subcutaneous QHS  . insulin aspart  0-9 Units Subcutaneous TID WC  . insulin aspart  4 Units Subcutaneous TID WC  . insulin glargine  35 Units Subcutaneous Daily  . sodium chloride  3 mL Intravenous Q12H   Continuous Infusions: .  sodium chloride 100 mL/hr at 11/03/14 1627    Principal Problem:   Sepsis Active Problems:   Hyperglycemia   DKA, type 2   Hypertension   UTI (lower urinary tract infection)   Hyponatremia   Tachycardia   Hyperosmolar non-ketotic state in patient with type 2 diabetes mellitus   Blood poisoning    Time spent: 30 minutes.     Barton Dubois  [facility] Pager [phone]. If 7PM-7AM, please contact night-coverage at [email or web address], password [facility] 11/03/2014, 7:31 PM  LOS: 3 days

## 2014-11-04 DIAGNOSIS — E1101 Type 2 diabetes mellitus with hyperosmolarity with coma: Secondary | ICD-10-CM

## 2014-11-04 DIAGNOSIS — A4151 Sepsis due to Escherichia coli [E. coli]: Secondary | ICD-10-CM

## 2014-11-04 DIAGNOSIS — I10 Essential (primary) hypertension: Secondary | ICD-10-CM

## 2014-11-04 DIAGNOSIS — N390 Urinary tract infection, site not specified: Secondary | ICD-10-CM

## 2014-11-04 DIAGNOSIS — R000 Tachycardia, unspecified: Secondary | ICD-10-CM

## 2014-11-04 DIAGNOSIS — E1310 Other specified diabetes mellitus with ketoacidosis without coma: Secondary | ICD-10-CM

## 2014-11-04 LAB — BASIC METABOLIC PANEL
ANION GAP: 7 (ref 5–15)
BUN: <5 mg/dL — ABNORMAL LOW (ref 6–23)
CO2: 26 mmol/L (ref 19–32)
Calcium: 8.6 mg/dL (ref 8.4–10.5)
Chloride: 104 mmol/L (ref 96–112)
Creatinine, Ser: 0.46 mg/dL — ABNORMAL LOW (ref 0.50–1.10)
GFR calc Af Amer: >90 mL/min (ref >90)
GFR calc non Af Amer: >90 mL/min (ref >90)
Glucose, Bld: 199 mg/dL — ABNORMAL HIGH (ref 70–99)
Potassium: 3.9 mmol/L (ref 3.5–5.1)
Sodium: 137 mmol/L (ref 135–145)

## 2014-11-04 LAB — CBC
HEMATOCRIT: 29.8 % — AB (ref 36.0–46.0)
HEMOGLOBIN: 10 g/dL — AB (ref 12.0–15.0)
MCH: 26.5 pg (ref 26.0–34.0)
MCHC: 33.6 g/dL (ref 30.0–36.0)
MCV: 79 fL (ref 78.0–100.0)
Platelets: 377 10*3/uL (ref 150–400)
RBC: 3.77 MIL/uL — AB (ref 3.87–5.11)
RDW: 13 % (ref 11.5–15.5)
WBC: 15.8 10*3/uL — AB (ref 4.0–10.5)

## 2014-11-04 LAB — FOLATE: Folate: 9.6 ng/mL

## 2014-11-04 LAB — IRON AND TIBC
Iron: 23 ug/dL — ABNORMAL LOW (ref 42–145)
Saturation Ratios: 11 % — ABNORMAL LOW (ref 20–55)
TIBC: 206 ug/dL — AB (ref 250–470)
UIBC: 183 ug/dL (ref 125–400)

## 2014-11-04 LAB — VITAMIN B12: VITAMIN B 12: 521 pg/mL (ref 211–911)

## 2014-11-04 LAB — GLUCOSE, CAPILLARY
GLUCOSE-CAPILLARY: 204 mg/dL — AB (ref 70–99)
GLUCOSE-CAPILLARY: 145 mg/dL — AB (ref 70–99)
Glucose-Capillary: 216 mg/dL — ABNORMAL HIGH (ref 70–99)
Glucose-Capillary: 180 mg/dL — ABNORMAL HIGH (ref 70–99)

## 2014-11-04 LAB — FERRITIN: Ferritin: 98 ng/mL (ref 10–291)

## 2014-11-04 MED ORDER — LIVING WELL WITH DIABETES BOOK
Freq: Once | Status: AC
  Administered 2014-11-04: 13:00:00
  Filled 2014-11-04: qty 1

## 2014-11-04 MED ORDER — METFORMIN HCL 500 MG PO TABS
1000.0000 mg | ORAL_TABLET | Freq: Two times a day (BID) | ORAL | Status: DC
  Filled 2014-11-04 (×4): qty 2

## 2014-11-04 MED ORDER — INSULIN GLARGINE 100 UNIT/ML ~~LOC~~ SOLN
8.0000 [IU] | Freq: Once | SUBCUTANEOUS | Status: AC
  Administered 2014-11-04: 8 [IU] via SUBCUTANEOUS
  Filled 2014-11-04: qty 0.08

## 2014-11-04 MED ORDER — INSULIN GLARGINE 100 UNIT/ML ~~LOC~~ SOLN
43.0000 [IU] | Freq: Every day | SUBCUTANEOUS | Status: DC
  Administered 2014-11-05: 43 [IU] via SUBCUTANEOUS
  Filled 2014-11-04: qty 0.43

## 2014-11-04 NOTE — Progress Notes (Signed)
[facility] PROGRESS NOTE  Sheila Bishop IZT:[id number] DOB: August 13, 1971 DOA: 10/31/2014 PCP: No PCP Per Patient    HPI/Subjective: Feels much better, still has blood sugar of about 200 in the morning and slight hematuria.  Assessment/Plan:  Sepsis;related to E. Coli UTI and E. Coli Bacteremia; Lactic acid was 5.5 on admission, fever up to 100.5 and white blood cells of 22.7 consistent with sepsis. Sepsis likely secondary to UTI. Patient started on vancomycin and Zosyn. WBC trending down and patient afebrile.   E. Coli UTI; Following sensitivity will switch to rocephin  Per ID as mentioned above, treat for 5 days IV and then 9 days of levaquin at discharge  Hyperosmolar hyperglycemic state Patient admitted to the hospital with significant hyperglycemia, blood glucose went up to 593. Presented with dehydration and hypovolemia but she did not develop acute renal failure. Treated with intensive insulin therapy, discontinued the IV insulin drip. Currently on SSI, carbohydrate modified diet. Hemoglobin A1c of 11.3, patient will be discharged on insulin, received 35 units today, increase it to 43 units.  Bronchitis;  Influenza negative.  Continue with PRN nebulizer and antitussives .  Her dry cough might be related to ACE. ?   Diabetes, uncontrolled. Lantus increased to 35 units for better control. Continue SSI.   Hematuria:  related to infection. Better than yesterday.  Anemia; Hgb stable. -anticoagulation discontinue due to hematuria -follow anemia panel   Code Status: Full Code.  Family Communication: Care discussed with patient.  Disposition Plan: Transfer to regular bed. CM consul;ted. Need PCP.   Consultants:  ID curbside (Dr. Johnnye Sima)  Procedures:  none  Antibiotics:  Vancomycin 1-30>>2/2  Zosyn 1-30>>2/2  Rocephin 2/2   Objective: Filed Vitals:   11/04/14 1411  BP: 130/74  Pulse: 91  Temp: 98.9 F (37.2 C)  Resp: 20    Intake/Output  Summary (Last 24 hours) at 11/04/14 1559 Last data filed at 11/04/14 1300  Gross per 24 hour  Intake 473.33 ml  Output   3200 ml  Net -2726.67 ml   Filed Weights   11/01/14 0110 11/02/14 0400  Weight: 91.5 kg (201 lb 11.5 oz) 93.7 kg (206 lb 9.1 oz)    Exam:   General:  Alert in no distress; feeling better and complaining of mild hematuria and frequency   Cardiovascular: S 1, S 2 RRR  Respiratory: good air movement, no wheezing  Abdomen: BS present, soft, nt  Musculoskeletal: no edema.   Data Reviewed: Basic Metabolic Panel:  Recent Labs Lab 11/01/14 0315 11/01/14 0802 11/02/14 0357 11/03/14 0527 11/04/14 0732  NA 136 136 133* 135 137  K 3.6 3.3* 4.0 3.8 3.9  CL 103 102 101 102 104  CO2 26 26 26 23 26   GLUCOSE 249* 131* 298* 192* 199*  BUN <5* <5* <5* <5* <5*  CREATININE 0.62 0.53 0.54 0.50 0.46*  CALCIUM 8.1* 8.1* 8.3* 8.3* 8.6   CBC:  Recent Labs Lab 10/31/14 2015 11/01/14 0315 11/02/14 0357 11/03/14 0527 11/04/14 0732  WBC 22.7* 19.6* 18.4* 17.3* 15.8*  HGB 11.8* 10.8* 10.4* 10.0* 10.0*  HCT 34.6* 32.3* 29.9* 29.9* 29.8*  MCV 77.1* 79.0 79.3 79.3 79.0  PLT 417* 340 337 354 377   CBG:  Recent Labs Lab 11/03/14 1159 11/03/14 1636 11/03/14 2204 11/04/14 0807 11/04/14 1221  GLUCAP 286* 269* 286* 204* 216*    Recent Results (from the past 240 hour(s))  Blood Culture (routine x 2)     Status: None (Preliminary result)   Collection Time:  10/31/14  9:16 PM  Result Value Ref Range Status   Specimen Description BLOOD RIGHT ARM  Final   Special Requests BOTTLES DRAWN AEROBIC AND ANAEROBIC 5CC  Final   Culture   Final           BLOOD CULTURE RECEIVED NO GROWTH TO DATE CULTURE WILL BE HELD FOR 5 DAYS BEFORE ISSUING A FINAL NEGATIVE REPORT Performed at [facility]    Report Status PENDING  Incomplete  Blood Culture (routine x 2)     Status: None   Collection Time: 10/31/14  9:18 PM  Result Value Ref Range Status   Specimen Description  BLOOD LEFT ARM  Final   Special Requests BOTTLES DRAWN AEROBIC AND ANAEROBIC 10CC  Final   Culture   Final    ESCHERICHIA COLI Note: Gram Stain Report Called to,Read Back By and Verified With: MISTY E RN @1005PM  VINCJ 11/01/14 Performed at [facility]    Report Status 11/03/2014 FINAL  Final   Organism ID, Bacteria ESCHERICHIA COLI  Final      Susceptibility   Escherichia coli - MIC*    AMPICILLIN >=32 RESISTANT Resistant     AMPICILLIN/SULBACTAM 16 INTERMEDIATE Intermediate     CEFAZOLIN <=4 SENSITIVE Sensitive     CEFEPIME <=1 SENSITIVE Sensitive     CEFTAZIDIME <=1 SENSITIVE Sensitive     CEFTRIAXONE <=1 SENSITIVE Sensitive     CIPROFLOXACIN <=0.25 SENSITIVE Sensitive     GENTAMICIN <=1 SENSITIVE Sensitive     IMIPENEM <=0.25 SENSITIVE Sensitive     PIP/TAZO <=4 SENSITIVE Sensitive     TOBRAMYCIN <=1 SENSITIVE Sensitive     TRIMETH/SULFA <=20 SENSITIVE Sensitive     * ESCHERICHIA COLI  Urine culture     Status: None   Collection Time: 10/31/14  9:28 PM  Result Value Ref Range Status   Specimen Description URINE, CLEAN CATCH  Final   Special Requests NONE  Final   Colony Count   Final    >=100,000 COLONIES/ML Performed at [facility]    Culture   Final    ESCHERICHIA COLI Performed at [facility]    Report Status 11/03/2014 FINAL  Final   Organism ID, Bacteria ESCHERICHIA COLI  Final      Susceptibility   Escherichia coli - MIC*    AMPICILLIN >=32 RESISTANT Resistant     CEFAZOLIN <=4 SENSITIVE Sensitive     CEFTRIAXONE <=1 SENSITIVE Sensitive     CIPROFLOXACIN <=0.25 SENSITIVE Sensitive     GENTAMICIN <=1 SENSITIVE Sensitive     LEVOFLOXACIN <=0.12 SENSITIVE Sensitive     NITROFURANTOIN <=16 SENSITIVE Sensitive     TOBRAMYCIN <=1 SENSITIVE Sensitive     TRIMETH/SULFA <=20 SENSITIVE Sensitive     PIP/TAZO <=4 SENSITIVE Sensitive     * ESCHERICHIA COLI  MRSA PCR Screening     Status: None   Collection Time: 11/01/14  1:05 AM  Result  Value Ref Range Status   MRSA by PCR NEGATIVE NEGATIVE Final    Comment:        The GeneXpert MRSA Assay (FDA approved for NASAL specimens only), is one component of a comprehensive MRSA colonization surveillance program. It is not intended to diagnose MRSA infection nor to guide or monitor treatment for MRSA infections.      Studies: No results found.  Scheduled Meds: . cefTRIAXone (ROCEPHIN)  IV  1 g Intravenous Q24H  . insulin aspart  0-9 Units Subcutaneous TID WC  . insulin aspart  4 Units Subcutaneous TID WC  . [START ON 11/05/2014] insulin glargine  43 Units Subcutaneous Daily  . metFORMIN  1,000 mg Oral BID WC  . sodium chloride  3 mL Intravenous Q12H   Continuous Infusions: . sodium chloride 50 mL/hr at 11/03/14 1930    Principal Problem:   Sepsis Active Problems:   Hyperglycemia   DKA, type 2   Hypertension   UTI (lower urinary tract infection)   Hyponatremia   Tachycardia   Hyperosmolar non-ketotic state in patient with type 2 diabetes mellitus   Blood poisoning    Time spent: 30 minutes.     [facility] A  [facility] Pager [phone]. If 7PM-7AM, please contact night-coverage at [email or web address], password [facility] 11/04/2014, 3:59 PM  LOS: 4 days

## 2014-11-04 NOTE — Progress Notes (Signed)
MD: Pt does not have insurance and will be following up with the [facility]. Pt can get meter and supplies from the clinic (order number [id number]). Also pt may have to be eventually transitioned to 70/30 insulin due to cost may not be able to afford Novolog and Lantus. Side note: Pt was not taking metformin at home prior to admission due to side effects. May need another alternative or for it to be d/c'd. Pt successfully is drawing up and self administering insulin. Per RN, pt has family at home with diabetes diagnosis that do not comply with a carb mod diet or proper glucose checks and self maintenance. Pt videos ordered as well as Living Well with Diabetes book ordered.  Thanks,  Tama Headings RN, MSN, [facility] Inpatient Diabetes Coordinator Team Pager [phone]

## 2014-11-04 NOTE — Progress Notes (Signed)
CRITICAL VALUE ALERT  Critical value received:  Blood Culture (Anaerobic) Gram Negative Rods  Date of notification: 11/04/2014  Time of notification:  2340  Critical value read back: Yes  Nurse who received alert:  Teneil Shiller P. Ernst Spell RN  MD notified (1st page):  Schorr NP  Time of first page:  2353  MD notified (2nd page):  Time of second page:  Responding MD:    Time MD responded:

## 2014-11-05 DIAGNOSIS — E871 Hypo-osmolality and hyponatremia: Secondary | ICD-10-CM

## 2014-11-05 LAB — BASIC METABOLIC PANEL
Anion gap: 10 (ref 5–15)
BUN: <5 mg/dL — ABNORMAL LOW (ref 6–23)
CO2: 24 mmol/L (ref 19–32)
CREATININE: 0.51 mg/dL (ref 0.50–1.10)
Calcium: 8.9 mg/dL (ref 8.4–10.5)
Chloride: 103 mmol/L (ref 96–112)
GFR calc non Af Amer: >90 mL/min (ref >90)
Glucose, Bld: 126 mg/dL — ABNORMAL HIGH (ref 70–99)
Potassium: 3.4 mmol/L — ABNORMAL LOW (ref 3.5–5.1)
SODIUM: 137 mmol/L (ref 135–145)

## 2014-11-05 LAB — GLUCOSE, CAPILLARY
Glucose-Capillary: 126 mg/dL — ABNORMAL HIGH (ref 70–99)
Glucose-Capillary: 111 mg/dL — ABNORMAL HIGH (ref 70–99)
Glucose-Capillary: 151 mg/dL — ABNORMAL HIGH (ref 70–99)
Glucose-Capillary: 205 mg/dL — ABNORMAL HIGH (ref 70–99)

## 2014-11-05 MED ORDER — CEFUROXIME AXETIL 500 MG PO TABS: 500.0000 mg | ORAL_TABLET | Freq: Two times a day (BID) | ORAL | Status: DC

## 2014-11-05 MED ORDER — LISINOPRIL 20 MG PO TABS: 20.0000 mg | ORAL_TABLET | Freq: Every day | ORAL | Status: DC

## 2014-11-05 MED ORDER — INSULIN GLARGINE 100 UNIT/ML SOLOSTAR PEN: 40.0000 [IU] | PEN_INJECTOR | Freq: Every day | SUBCUTANEOUS | Status: DC

## 2014-11-05 MED ORDER — INSULIN DETEMIR 100 UNIT/ML FLEXPEN: 40.0000 [IU] | PEN_INJECTOR | Freq: Every day | SUBCUTANEOUS | Status: DC

## 2014-11-05 MED ORDER — POTASSIUM CHLORIDE CRYS ER 20 MEQ PO TBCR
60.0000 meq | EXTENDED_RELEASE_TABLET | Freq: Once | ORAL | Status: AC
  Administered 2014-11-05: 60 meq via ORAL
  Filled 2014-11-05: qty 3

## 2014-11-05 MED ORDER — GLIPIZIDE 10 MG PO TABS: 10.0000 mg | ORAL_TABLET | Freq: Two times a day (BID) | ORAL | Status: DC

## 2014-11-05 MED ORDER — INSULIN ASPART 100 UNIT/ML ~~LOC~~ SOLN: 6.0000 [IU] | Freq: Three times a day (TID) | SUBCUTANEOUS | Status: DC

## 2014-11-05 MED ORDER — INSULIN LISPRO 100 UNIT/ML (KWIKPEN)
6.0000 [IU] | PEN_INJECTOR | Freq: Three times a day (TID) | SUBCUTANEOUS | Status: DC
Start: 2014-11-05 — End: 2014-11-05

## 2014-11-05 MED ORDER — CEFUROXIME AXETIL 500 MG PO TABS
500.0000 mg | ORAL_TABLET | Freq: Two times a day (BID) | ORAL | Status: DC
  Filled 2014-11-05: qty 1

## 2014-11-05 NOTE — Discharge Summary (Addendum)
Physician Discharge Summary  Sheila Bishop JKD:[id number] DOB: [birth year] DOA: 10/31/2014  PCP: No PCP Per Patient  Admit date: 10/31/2014 Discharge date: 11/05/2014  Time spent: 40 minutes  Recommendations for Outpatient Follow-up:  1. Follow-up with primary care physician within 1-2 weeks. 2. Patient needs adjustment of her diabetes regimen, refused to take metformin or insulin. 3. Discharge only on glipizide. 4. She needs referral to ophthalmology for dilated eye exam, she reports she cannot see the numbers on the insulin syringe.  Discharge Diagnoses:  Principal Problem:   Sepsis Active Problems:   Hyperglycemia   DKA, type 2   Hypertension   UTI (lower urinary tract infection)   Hyponatremia   Tachycardia   Hyperosmolar non-ketotic state in patient with type 2 diabetes mellitus   Blood poisoning   Discharge Condition: Stable  Diet recommendation: Carb modified diet  Filed Weights   11/01/14 0110 11/02/14 0400  Weight: 91.5 kg (201 lb 11.5 oz) 93.7 kg (206 lb 9.1 oz)    History of present illness:  Sheila Bishop is a 44 y.o. female with a history of DM2, HTN out of her medications x several months who presents to the ED with complaints of malaise and polyuria and weakness and fevers and chills. She had been seen in the ED on 01/24 for acute bronchitis and was placed on prednisone she took 2 days of it but it made her fell funny and she was not able to work, so she stopped taking it. In the ED tonight her glucose was found to be 593 and she was found to be in DKA. A UA was performed and also found to be positive . Lactate level returned at 5.51. She was placed on the DKA protocol and cultures were sent, and she was started on IV Vancomycin and Zosyn.   Hospital Course:    Sepsis;related to E. Coli UTI and E. Coli Bacteremia Lactic acid was 5.5 on admission, fever up to 100.5 and white blood cells of 22.7 consistent with sepsis. Sepsis likely secondary  to UTI. Patient started on vancomycin and Zosyn. Sepsis resolved, patient discharged home on Ceftin to complete a total 14 days of antibiotics.  E. Coli UTI Escherichia coli sensitive to cephalosporins and levofloxacin but not to ampicillin. Per ID as mentioned above, treat for 5 days with IV antibiotics, discharge on Ceftin to complete 14 days.  Hyperosmolar hyperglycemic state Patient admitted to the hospital with significant hyperglycemia, blood glucose went up to 593. Presented with dehydration and hypovolemia but she did not develop acute renal failure.  Treated with intensive insulin therapy, discontinued the IV insulin drip. No evidence of ketosis or acidosis at the time of admission. Currently on SSI, carbohydrate modified diet.  Diabetes mellitus type 2, uncontrolled Hemoglobin A1c of 11.3, which correlate with mean plasma glucose of 273. Patient refused to take metformin. Initially she was discharged on 40 units of Lantus and 6 units of NovoLog with meals. Patient refused to take insulin, reporting that she cannot see the numbers on the syringe and she wants to take oral pills. Patient will be discharged only on glipizide.  Bronchitis;  Influenza negative.  Continue with PRN nebulizer and antitussives .  Her dry cough might be related to ACE. ?   Hematuria: This is likely related to the UTI, resolved.  Anemia; Hgb stable. -Likely chronic anemia, and instructed to take OTC multivitamins   Procedures:  None  Consultations:  None  Discharge Exam: Filed Vitals:   11/05/14 1415  BP: 137/87  Pulse:   Temp: 98.7 F (37.1 C)  Resp: 20   General: Alert and awake, oriented x3, not in any acute distress. HEENT: anicteric sclera, pupils reactive to light and accommodation, EOMI CVS: S1-S2 clear, no murmur rubs or gallops Chest: clear to auscultation bilaterally, no wheezing, rales or rhonchi Abdomen: soft nontender, nondistended, normal bowel sounds, no  organomegaly Extremities: no cyanosis, clubbing or edema noted bilaterally Neuro: Cranial nerves II-XII intact, no focal neurological deficits  Discharge Instructions   Discharge Instructions    Diet Carb Modified    Complete by:  As directed      Increase activity slowly    Complete by:  As directed           Current Discharge Medication List    START taking these medications   Details  cefUROXime (CEFTIN) 500 MG tablet Take 1 tablet (500 mg total) by mouth 2 (two) times daily with a meal. Qty: 20 tablet, Refills: 0    glipiZIDE (GLUCOTROL) 10 MG tablet Take 1 tablet (10 mg total) by mouth 2 (two) times daily before a meal. Qty: 60 tablet, Refills: 0      CONTINUE these medications which have CHANGED   Details  lisinopril (PRINIVIL,ZESTRIL) 20 MG tablet Take 1 tablet (20 mg total) by mouth daily. Qty: 30 tablet, Refills: 0      STOP taking these medications     predniSONE (DELTASONE) 20 MG tablet        No Known Allergies Follow-up Information    Follow up with [place] [facility]     On 11/13/2014.   Why:  4:15 for hospital follow up   Contact information:   [address] [place] [address] [phone]       The results of significant diagnostics from this hospitalization (including imaging, microbiology, ancillary and laboratory) are listed below for reference.    Significant Diagnostic Studies: Dg Chest 2 View (if Patient Has Fever And/or Copd)  10/25/2014   CLINICAL DATA:  Wheezing, shaking, cough for 3 weeks  EXAM: CHEST  2 VIEW  COMPARISON:  08/02/2011  FINDINGS: Cardiomediastinal silhouette is stable. No acute infiltrate or pleural effusion. No pulmonary edema. Bony thorax is unremarkable.  IMPRESSION: No active cardiopulmonary disease.   Electronically Signed   By: Lahoma Crocker M.D.   On: 10/25/2014 13:52   Ct Angio Chest Pe W/cm &/or Wo Cm  10/25/2014   CLINICAL DATA:  Upper respiratory of infection.  Productive cough. Intermittent shortness of breath. Fever.  EXAM: CT ANGIOGRAPHY CHEST WITH CONTRAST  TECHNIQUE: Multidetector CT imaging of the chest was performed using the standard protocol during bolus administration of intravenous contrast. Multiplanar CT image reconstructions and MIPs were obtained to evaluate the vascular anatomy.  CONTRAST:  12mL OMNIPAQUE IOHEXOL 350 MG/ML SOLN  COMPARISON:  Multiple exams, including 02/23/2009 and 10/25/2014  FINDINGS: Reduced sensitivity due to dilute contrast bolus and patient body habitus. No filling defect is identified in the pulmonary arterial tree to suggest pulmonary embolus. No acute aortic abnormality observed.  Small type 1 hiatal hernia. No pathologic thoracic adenopathy. The a submental lymph node measures 1 cm in diameter, borderline prominent. Scattered small axillary lymph nodes are present.  The proximal tracheobronchial tree appears normal. Several small right middle lobe peripheral pulmonary nodules are unchanged from 2010 and considered benign. Similar small benign subpleural nodules along the lingula and left lower lobe.  Review of the MIP images confirms the above findings.  IMPRESSION: 1. No findings of pneumonia. No pulmonary embolus, although sensitivity for small emboli is reduced by body habitus and dilution of the contrast bolus. 2. Several tiny pulmonary nodules are unchanged from 2010 and considered benign and clinically insignificant. 3. Small type 1 hiatal hernia.   Electronically Signed   By: Sherryl Barters M.D.   On: 10/25/2014 17:15   Dg Chest Port 1 View  10/31/2014   CLINICAL DATA:  Cough and difficulty breathing ; fatigue  EXAM: PORTABLE CHEST - 1 VIEW  COMPARISON:  Chest radiograph August 02, 2011 and chest CT October 25, 2014  FINDINGS: There is no edema or consolidation. Heart is upper normal in size with pulmonary vascularity within normal limits. No adenopathy. No pneumothorax. No bone lesions.  IMPRESSION: No edema or  consolidation.   Electronically Signed   By: Lowella Grip M.D.   On: 10/31/2014 21:27    Microbiology: Recent Results (from the past 240 hour(s))  Blood Culture (routine x 2)     Status: None (Preliminary result)   Collection Time: 10/31/14  9:16 PM  Result Value Ref Range Status   Specimen Description BLOOD RIGHT ARM  Final   Special Requests BOTTLES DRAWN AEROBIC AND ANAEROBIC 5CC  Final   Culture   Final    GRAM NEGATIVE RODS Performed at [facility]    Report Status PENDING  Incomplete  Blood Culture (routine x 2)     Status: None   Collection Time: 10/31/14  9:18 PM  Result Value Ref Range Status   Specimen Description BLOOD LEFT ARM  Final   Special Requests BOTTLES DRAWN AEROBIC AND ANAEROBIC 10CC  Final   Culture   Final    ESCHERICHIA COLI Note: Gram Stain Report Called to,Read Back By and Verified With: MISTY E RN @1005PM  VINCJ 11/01/14 Performed at [facility]    Report Status 11/03/2014 FINAL  Final   Organism ID, Bacteria ESCHERICHIA COLI  Final      Susceptibility   Escherichia coli - MIC*    AMPICILLIN >=32 RESISTANT Resistant     AMPICILLIN/SULBACTAM 16 INTERMEDIATE Intermediate     CEFAZOLIN <=4 SENSITIVE Sensitive     CEFEPIME <=1 SENSITIVE Sensitive     CEFTAZIDIME <=1 SENSITIVE Sensitive     CEFTRIAXONE <=1 SENSITIVE Sensitive     CIPROFLOXACIN <=0.25 SENSITIVE Sensitive     GENTAMICIN <=1 SENSITIVE Sensitive     IMIPENEM <=0.25 SENSITIVE Sensitive     PIP/TAZO <=4 SENSITIVE Sensitive     TOBRAMYCIN <=1 SENSITIVE Sensitive     TRIMETH/SULFA <=20 SENSITIVE Sensitive     * ESCHERICHIA COLI  Urine culture     Status: None   Collection Time: 10/31/14  9:28 PM  Result Value Ref Range Status   Specimen Description URINE, CLEAN CATCH  Final   Special Requests NONE  Final   Colony Count   Final    >=100,000 COLONIES/ML Performed at [facility]    Culture   Final    ESCHERICHIA COLI Performed at [facility]     Report Status 11/03/2014 FINAL  Final   Organism ID, Bacteria ESCHERICHIA COLI  Final      Susceptibility   Escherichia coli - MIC*    AMPICILLIN >=32 RESISTANT Resistant     CEFAZOLIN <=4 SENSITIVE Sensitive     CEFTRIAXONE <=1 SENSITIVE Sensitive     CIPROFLOXACIN <=0.25 SENSITIVE Sensitive     GENTAMICIN <=1 SENSITIVE Sensitive     LEVOFLOXACIN <=0.12 SENSITIVE  Sensitive     NITROFURANTOIN <=16 SENSITIVE Sensitive     TOBRAMYCIN <=1 SENSITIVE Sensitive     TRIMETH/SULFA <=20 SENSITIVE Sensitive     PIP/TAZO <=4 SENSITIVE Sensitive     * ESCHERICHIA COLI  MRSA PCR Screening     Status: None   Collection Time: 11/01/14  1:05 AM  Result Value Ref Range Status   MRSA by PCR NEGATIVE NEGATIVE Final    Comment:        The GeneXpert MRSA Assay (FDA approved for NASAL specimens only), is one component of a comprehensive MRSA colonization surveillance program. It is not intended to diagnose MRSA infection nor to guide or monitor treatment for MRSA infections.      Labs: Basic Metabolic Panel:  Recent Labs Lab 11/01/14 0802 11/02/14 0357 11/03/14 0527 11/04/14 0732 11/05/14 0702  NA 136 133* 135 137 137  K 3.3* 4.0 3.8 3.9 3.4*  CL 102 101 102 104 103  CO2 26 26 23 26 24   GLUCOSE 131* 298* 192* 199* 126*  BUN <5* <5* <5* <5* <5*  CREATININE 0.53 0.54 0.50 0.46* 0.51  CALCIUM 8.1* 8.3* 8.3* 8.6 8.9   Liver Function Tests: No results for input(s): AST, ALT, ALKPHOS, BILITOT, PROT, ALBUMIN in the last 168 hours. No results for input(s): LIPASE, AMYLASE in the last 168 hours. No results for input(s): AMMONIA in the last 168 hours. CBC:  Recent Labs Lab 10/31/14 2015 11/01/14 0315 11/02/14 0357 11/03/14 0527 11/04/14 0732  WBC 22.7* 19.6* 18.4* 17.3* 15.8*  HGB 11.8* 10.8* 10.4* 10.0* 10.0*  HCT 34.6* 32.3* 29.9* 29.9* 29.8*  MCV 77.1* 79.0 79.3 79.3 79.0  PLT 417* 340 337 354 377   Cardiac Enzymes: No results for input(s): CKTOTAL, CKMB, CKMBINDEX,  TROPONINI in the last 168 hours. BNP: BNP (last 3 results) No results for input(s): BNP in the last 8760 hours.  ProBNP (last 3 results) No results for input(s): PROBNP in the last 8760 hours.  CBG:  Recent Labs Lab 11/04/14 1718 11/04/14 2155 11/05/14 0649 11/05/14 0802 11/05/14 1209  GLUCAP 145* 180* 126* 111* 151*       Signed:  Evalena Fujii A  [facility] 11/05/2014, 4:25 PM

## 2014-11-05 NOTE — Progress Notes (Signed)
Inpatient Diabetes Program Recommendations  AACE/ADA: New Consensus Statement on Inpatient Glycemic Control (2013)  Target Ranges:  Prepandial:   less than 140 mg/dL      Peak postprandial:   less than 180 mg/dL (1-2 hours)      Critically ill patients:  140 - 180 mg/dL     Diabetes history: DM2 Current orders for Inpatient glycemic control: Lantus 43 units Daily, Metformin 1,000 mg BID, Novolog 0-9 units TID with meals, Novolog 4 units TID with meals for meal coverage.  Inpatient Diabetes Program Recommendations  Insulin - Basal: If patient is to be transitioned to 70/30, consider Novolin 70/30 38 units BID with breakfast and supper.  Note: Pt does not have insurance and will be following up with the [facility]. Pt can get meter and supplies from the clinic (order number [id number]). At discharge, if patient is d/c'd on 70/30 please order the Novolin 70/30, pt may not be able to afford Novolog and Lantus. Side note: Pt was not taking metformin at home prior to admission due to side effects. Pt refusing med here. If you consider another alternative with less side effects there is a generic Metformin ER.  Thanks,  Tama Headings RN, MSN, [facility] Inpatient Diabetes Coordinator Team Pager [phone]

## 2014-11-05 NOTE — Progress Notes (Signed)
Sheila Bishop discharged Home per MD order.  Discharge instructions reviewed and discussed with the patient, all questions and concerns answered. Copy of instructions and scripts given to patient.    Medication List    STOP taking these medications        predniSONE 20 MG tablet  Commonly known as:  DELTASONE      TAKE these medications        cefUROXime 500 MG tablet  Commonly known as:  CEFTIN  Take 1 tablet (500 mg total) by mouth 2 (two) times daily with a meal.  Start taking on:  11/06/2014     glipiZIDE 10 MG tablet  Commonly known as:  GLUCOTROL  Take 1 tablet (10 mg total) by mouth 2 (two) times daily before a meal.     lisinopril 20 MG tablet  Commonly known as:  PRINIVIL,ZESTRIL  Take 1 tablet (20 mg total) by mouth daily.        Patients skin is clean, dry and intact, no evidence of skin break down. IV site discontinued and catheter remains intact. Site without signs and symptoms of complications. Dressing and pressure applied.  Patient escorted to car by NT in a wheelchair,  no distress noted upon discharge.  [name], [name] C 11/05/2014 6:19 PM

## 2014-11-07 LAB — CULTURE, BLOOD (ROUTINE X 2)

## 2014-11-13 ENCOUNTER — Inpatient Hospital Stay: Payer: No Typology Code available for payment source | Admitting: Family Medicine

## 2014-11-26 ENCOUNTER — Encounter: Payer: Self-pay | Admitting: Family Medicine

## 2014-11-26 ENCOUNTER — Ambulatory Visit: Payer: No Typology Code available for payment source | Attending: Family Medicine | Admitting: Family Medicine

## 2014-11-26 VITALS — BP 131/84 | HR 85 | Temp 98.1°F | Resp 16 | Ht 66.0 in | Wt 213.0 lb

## 2014-11-26 DIAGNOSIS — B351 Tinea unguium: Secondary | ICD-10-CM | POA: Diagnosis not present

## 2014-11-26 DIAGNOSIS — E1165 Type 2 diabetes mellitus with hyperglycemia: Secondary | ICD-10-CM | POA: Diagnosis not present

## 2014-11-26 DIAGNOSIS — M79676 Pain in unspecified toe(s): Secondary | ICD-10-CM

## 2014-11-26 DIAGNOSIS — I10 Essential (primary) hypertension: Secondary | ICD-10-CM | POA: Insufficient documentation

## 2014-11-26 DIAGNOSIS — Z87891 Personal history of nicotine dependence: Secondary | ICD-10-CM | POA: Insufficient documentation

## 2014-11-26 DIAGNOSIS — Z833 Family history of diabetes mellitus: Secondary | ICD-10-CM | POA: Diagnosis not present

## 2014-11-26 DIAGNOSIS — IMO0002 Reserved for concepts with insufficient information to code with codable children: Secondary | ICD-10-CM | POA: Insufficient documentation

## 2014-11-26 LAB — GLUCOSE, POCT (MANUAL RESULT ENTRY): POC GLUCOSE: 83 mg/dL (ref 70–99)

## 2014-11-26 MED ORDER — TERBINAFINE HCL 250 MG PO TABS: 250.0000 mg | ORAL_TABLET | Freq: Every day | ORAL | Status: DC

## 2014-11-26 MED ORDER — GLIPIZIDE 10 MG PO TABS: 10.0000 mg | ORAL_TABLET | Freq: Two times a day (BID) | ORAL | Status: DC

## 2014-11-26 MED ORDER — ACCU-CHEK FASTCLIX LANCETS MISC: 1.0000 | Freq: Every day | Status: DC

## 2014-11-26 MED ORDER — LISINOPRIL 40 MG PO TABS: 40.0000 mg | ORAL_TABLET | Freq: Every day | ORAL | Status: DC

## 2014-11-26 MED ORDER — ACCU-CHEK NANO SMARTVIEW W/DEVICE KIT: 1.0000 | PACK | Status: DC | PRN

## 2014-11-26 MED ORDER — GLUCOSE BLOOD VI STRP: 1.0000 | ORAL_STRIP | Freq: Every day | Status: DC

## 2014-11-26 MED ORDER — SITAGLIPTIN PHOSPHATE 100 MG PO TABS: 100.0000 mg | ORAL_TABLET | Freq: Every day | ORAL | Status: DC

## 2014-11-26 NOTE — Assessment & Plan Note (Signed)
A: L great toenail P: lamisil x 12 weeks course  Podiatry referral

## 2014-11-26 NOTE — Assessment & Plan Note (Signed)
  2. HTN:  Goal BP < 140/90 Lisinopril increased to 40 mg daily

## 2014-11-26 NOTE — Patient Instructions (Addendum)
Sheila Bishop,  Thank you for coming in today. It was a pleasure meeting you. I look forward to being your primary doctor.   1. Diabetes Goal A1c < 7  Continue glipizide Continue januvia Continue to exercise try to get in 3-4 times per week Continue healthy diet  Monitor sugars  Diabetes  Check blood sugar day fasting and before meals - 2-3 times per day Goal fasting 100  Goal after eating < 160 Beware of hypoglycemia (low blood sugar) which is blood sugar < 70 with or without symptoms   2. HTN:  Goal BP < 140/90 Lisinopril increased to 40 mg daily   3. Painful L great toe with fungus and ingrown: Normal liver lamisil x 12 weeks, repeat liver function test in 6 weeks  Podiatry referral   F/u in 3- 4 weeks with nurse for BP and CBG check F/u with me in 2 months, we will repeat your A1c  Dr. Adrian Blackwater

## 2014-11-26 NOTE — Assessment & Plan Note (Signed)
1. Diabetes Goal A1c < 7  Continue glipizide Continue januvia Continue to exercise try to get in 3-4 times per week Continue healthy diet  Monitor sugars  Diabetes  Check blood sugar day fasting and before meals - 2-3 times per day Goal fasting 100  Goal after eating < 160 Beware of hypoglycemia (low blood sugar) which is blood sugar < 70 with or without symptoms

## 2014-11-26 NOTE — Progress Notes (Signed)
   Subjective:    Patient ID: Sheila Bishop, female    DOB: 16-Apr-1971, 43 y.o.   MRN: [id number] CC: establish care, HFU diabetes,  HPI 44 yo F NP:  1. HFU: UTI and hyperglycemia. Doing well now. No dysuria, flank pain, fever. Finished abx.  2. DM2: determined to get healthy now. Compliant with glipizide, exercise, low carb diet. Denies vision loss, CP, SOB, GI upset. Admits to pain in L great toe.   3. HTN: mild HA. Taking lisinopril. No swelling or cough.   Soc Hx: former smoker, quit in 2009  Med Hx: Dm2 dx 12/25/2007 Fam Hx: DM in mother, 2 sisters, one sister died of complications of DM2 Review of Systems As per HPI     Objective:   Physical Exam BP 144/91 mmHg  Pulse 90  Temp(Src) 98.1 F (36.7 C)  Resp 16  Ht 5\' 6"  (1.676 m)  Wt 213 lb (96.616 kg)  BMI 34.40 kg/m2  SpO2 96%  LMP 11/15/2014 General appearance: alert, cooperative and no distress Lungs: clear to auscultation bilaterally Heart: regular rate and rhythm, S1, S2 normal, no murmur, click, rub or gallop Extremities: extremities normal, atraumatic, no cyanosis or edema  Lab Results  Component Value Date   HGBA1C 11.3* 11/01/2014   CBG 83      Assessment & Plan:

## 2014-11-27 ENCOUNTER — Encounter: Payer: Self-pay | Admitting: Emergency Medicine

## 2014-11-27 ENCOUNTER — Encounter: Payer: Self-pay | Admitting: Family Medicine

## 2014-11-27 ENCOUNTER — Telehealth: Payer: Self-pay | Admitting: *Deleted

## 2014-11-27 LAB — MICROALBUMIN / CREATININE URINE RATIO
Creatinine, Urine: 107.7 mg/dL
Microalb Creat Ratio: 38.1 mg/g — ABNORMAL HIGH (ref 0.0–30.0)
Microalb, Ur: 4.1 mg/dL — ABNORMAL HIGH (ref <2.0)

## 2014-11-27 NOTE — Progress Notes (Signed)
FMLA form filled out Please fax to [phone] Phone [phone]

## 2014-11-27 NOTE — Telephone Encounter (Signed)
Accidental

## 2014-11-27 NOTE — Telephone Encounter (Signed)
Pt aware of results 

## 2014-11-27 NOTE — Telephone Encounter (Signed)
-----   Message from Minerva Ends, MD sent at 11/27/2014  9:33 AM EST ----- Elevated urine microalb Continue lisinopril

## 2014-12-14 ENCOUNTER — Other Ambulatory Visit: Payer: Self-pay | Admitting: Family Medicine

## 2014-12-14 DIAGNOSIS — IMO0002 Reserved for concepts with insufficient information to code with codable children: Secondary | ICD-10-CM

## 2014-12-14 DIAGNOSIS — E1165 Type 2 diabetes mellitus with hyperglycemia: Secondary | ICD-10-CM

## 2014-12-14 MED ORDER — METFORMIN HCL ER 500 MG PO TB24: 1000.0000 mg | ORAL_TABLET | Freq: Every day | ORAL | Status: DC

## 2014-12-14 NOTE — Telephone Encounter (Signed)
Called patient. Left VM Removed januvia from med list, too expensive. Added metformin XR.  Patient to call back with questions or concerns. If for some reason she cannot tolerate meformin, the next option would be injectables or SGLT2-farxiga, invokana.

## 2014-12-17 ENCOUNTER — Ambulatory Visit: Payer: No Typology Code available for payment source | Attending: Family Medicine | Admitting: *Deleted

## 2014-12-17 VITALS — BP 118/76 | HR 94 | Temp 98.7°F | Resp 20

## 2014-12-17 DIAGNOSIS — E1165 Type 2 diabetes mellitus with hyperglycemia: Secondary | ICD-10-CM

## 2014-12-17 DIAGNOSIS — E119 Type 2 diabetes mellitus without complications: Secondary | ICD-10-CM | POA: Insufficient documentation

## 2014-12-17 DIAGNOSIS — R51 Headache: Secondary | ICD-10-CM | POA: Insufficient documentation

## 2014-12-17 LAB — GLUCOSE, POCT (MANUAL RESULT ENTRY)
POC GLUCOSE: 106 mg/dL — AB (ref 70–99)
POC GLUCOSE: 63 mg/dL — AB (ref 70–99)

## 2014-12-17 NOTE — Patient Instructions (Signed)
Hypoglycemia Hypoglycemia occurs when the glucose in your blood is too low. Glucose is a type of sugar that is your body's main energy source. Hormones, such as insulin and glucagon, control the level of glucose in the blood. Insulin lowers blood glucose and glucagon increases blood glucose. Having too much insulin in your blood stream, or not eating enough food containing sugar, can result in hypoglycemia. Hypoglycemia can happen to people with or without diabetes. It can develop quickly and can be a medical emergency.  CAUSES   Missing or delaying meals.  Not eating enough carbohydrates at meals.  Taking too much diabetes medicine.  Not timing your oral diabetes medicine or insulin doses with meals, snacks, and exercise.  Nausea and vomiting.  Certain medicines.  Severe illnesses, such as hepatitis, kidney disorders, and certain eating disorders.  Increased activity or exercise without eating something extra or adjusting medicines.  Drinking too much alcohol.  A nerve disorder that affects body functions like your heart rate, blood pressure, and digestion (autonomic neuropathy).  A condition where the stomach muscles do not function properly (gastroparesis). Therefore, medicines and food may not absorb properly.  Rarely, a tumor of the pancreas can produce too much insulin. SYMPTOMS   Hunger.  Sweating (diaphoresis).  Change in body temperature.  Shakiness.  Headache.  Anxiety.  Lightheadedness.  Irritability.  Difficulty concentrating.  Dry mouth.  Tingling or numbness in the hands or feet.  Restless sleep or sleep disturbances.  Altered speech and coordination.  Change in mental status.  Seizures or prolonged convulsions.  Combativeness.  Drowsiness (lethargic).  Weakness.  Increased heart rate or palpitations.  Confusion.  Pale, gray skin color.  Blurred or double vision.  Fainting. DIAGNOSIS  A physical exam and medical history will be  performed. Your caregiver may make a diagnosis based on your symptoms. Blood tests and other lab tests may be performed to confirm a diagnosis. Once the diagnosis is made, your caregiver will see if your signs and symptoms go away once your blood glucose is raised.  TREATMENT  Usually, you can easily treat your hypoglycemia when you notice symptoms.  Check your blood glucose. If it is less than 70 mg/dl, take one of the following:   3-4 glucose tablets.    cup juice.    cup regular soda.   1 cup skim milk.   -1 tube of glucose gel.   5-6 hard candies.   Avoid high-fat drinks or food that may delay a rise in blood glucose levels.  Do not take more than the recommended amount of sugary foods, drinks, gel, or tablets. Doing so will cause your blood glucose to go too high.   Wait 10-15 minutes and recheck your blood glucose. If it is still less than 70 mg/dl or below your target range, repeat treatment.   Eat a snack if it is more than 1 hour until your next meal.  There may be a time when your blood glucose may go so low that you are unable to treat yourself at home when you start to notice symptoms. You may need someone to help you. You may even faint or be unable to swallow. If you cannot treat yourself, someone will need to bring you to the hospital.  HOME CARE INSTRUCTIONS  If you have diabetes, follow your diabetes management plan by:  Taking your medicines as directed.  Following your exercise plan.  Following your meal plan. Do not skip meals. Eat on time.  Testing your blood   glucose regularly. Check your blood glucose before and after exercise. If you exercise longer or different than usual, be sure to check blood glucose more frequently.  Wearing your medical alert jewelry that says you have diabetes.  Identify the cause of your hypoglycemia. Then, develop ways to prevent the recurrence of hypoglycemia.  Do not take a hot bath or shower right after an  insulin shot.  Always carry treatment with you. Glucose tablets are the easiest to carry.  If you are going to drink alcohol, drink it only with meals.  Tell friends or family members ways to keep you safe during a seizure. This may include removing hard or sharp objects from the area or turning you on your side.  Maintain a healthy weight. SEEK MEDICAL CARE IF:   You are having problems keeping your blood glucose in your target range.  You are having frequent episodes of hypoglycemia.  You feel you might be having side effects from your medicines.  You are not sure why your blood glucose is dropping so low.  You notice a change in vision or a new problem with your vision. SEEK IMMEDIATE MEDICAL CARE IF:   Confusion develops.  A change in mental status occurs.  The inability to swallow develops.  Fainting occurs. Document Released: 09/18/2005 Document Revised: 09/23/2013 Document Reviewed: 01/15/2012 [facility] Patient Information 2015 [place], [place]. This information is not intended to replace advice given to you by your health care provider. Make sure you discuss any questions you have with your health care provider. Diabetes Mellitus and Food It is important for you to manage your blood sugar (glucose) level. Your blood glucose level can be greatly affected by what you eat. Eating healthier foods in the appropriate amounts throughout the day at about the same time each day will help you control your blood glucose level. It can also help slow or prevent worsening of your diabetes mellitus. Healthy eating may even help you improve the level of your blood pressure and reach or maintain a healthy weight.  HOW CAN FOOD AFFECT ME? Carbohydrates Carbohydrates affect your blood glucose level more than any other type of food. Your dietitian will help you determine how many carbohydrates to eat at each meal and teach you how to count carbohydrates. Counting carbohydrates is important to  keep your blood glucose at a healthy level, especially if you are using insulin or taking certain medicines for diabetes mellitus. Alcohol Alcohol can cause sudden decreases in blood glucose (hypoglycemia), especially if you use insulin or take certain medicines for diabetes mellitus. Hypoglycemia can be a life-threatening condition. Symptoms of hypoglycemia (sleepiness, dizziness, and disorientation) are similar to symptoms of having too much alcohol.  If your health care provider has given you approval to drink alcohol, do so in moderation and use the following guidelines:  Women should not have more than one drink per day, and men should not have more than two drinks per day. One drink is equal to:  12 oz of beer.  5 oz of wine.  1 oz of hard liquor.  Do not drink on an empty stomach.  Keep yourself hydrated. Have water, diet soda, or unsweetened iced tea.  Regular soda, juice, and other mixers might contain a lot of carbohydrates and should be counted. WHAT FOODS ARE NOT RECOMMENDED? As you make food choices, it is important to remember that all foods are not the same. Some foods have fewer nutrients per serving than other foods, even though they might have  the same number of calories or carbohydrates. It is difficult to get your body what it needs when you eat foods with fewer nutrients. Examples of foods that you should avoid that are high in calories and carbohydrates but low in nutrients include:  Trans fats (most processed foods list trans fats on the Nutrition Facts label).  Regular soda.  Juice.  Candy.  Sweets, such as cake, pie, doughnuts, and cookies.  Fried foods. WHAT FOODS CAN I EAT? Have nutrient-rich foods, which will nourish your body and keep you healthy. The food you should eat also will depend on several factors, including:  The calories you need.  The medicines you take.  Your weight.  Your blood glucose level.  Your blood pressure level.  Your  cholesterol level. You also should eat a variety of foods, including:  Protein, such as meat, poultry, fish, tofu, nuts, and seeds (lean animal proteins are best).  Fruits.  Vegetables.  Dairy products, such as milk, cheese, and yogurt (low fat is best).  Breads, grains, pasta, cereal, rice, and beans.  Fats such as olive oil, trans fat-free margarine, canola oil, avocado, and olives. DOES EVERYONE WITH DIABETES MELLITUS HAVE THE SAME MEAL PLAN? Because every person with diabetes mellitus is different, there is not one meal plan that works for everyone. It is very important that you meet with a dietitian who will help you create a meal plan that is just right for you. Document Released: 06/15/2005 Document Revised: 09/23/2013 Document Reviewed: 08/15/2013 [facility] Patient Information 2015 [place], [place]. This information is not intended to replace advice given to you by your health care provider. Make sure you discuss any questions you have with your health care provider. Diabetes and Exercise Exercising regularly is important. It is not just about losing weight. It has many health benefits, such as:  Improving your overall fitness, flexibility, and endurance.  Increasing your bone density.  Helping with weight control.  Decreasing your body fat.  Increasing your muscle strength.  Reducing stress and tension.  Improving your overall health. People with diabetes who exercise gain additional benefits because exercise:  Reduces appetite.  Improves the body's use of blood sugar (glucose).  Helps lower or control blood glucose.  Decreases blood pressure.  Helps control blood lipids (such as cholesterol and triglycerides).  Improves the body's use of the hormone insulin by:  Increasing the body's insulin sensitivity.  Reducing the body's insulin needs.  Decreases the risk for heart disease because exercising:  Lowers cholesterol and triglycerides levels.  Increases  the levels of good cholesterol (such as high-density lipoproteins [HDL]) in the body.  Lowers blood glucose levels. YOUR ACTIVITY PLAN  Choose an activity that you enjoy and set realistic goals. Your health care provider or diabetes educator can help you make an activity plan that works for you. Exercise regularly as directed by your health care provider. This includes:  Performing resistance training twice a week such as push-ups, sit-ups, lifting weights, or using resistance bands.  Performing 150 minutes of cardio exercises each week such as walking, running, or playing sports.  Staying active and spending no more than 90 minutes at one time being inactive. Even short bursts of exercise are good for you. Three 10-minute sessions spread throughout the day are just as beneficial as a single 30-minute session. Some exercise ideas include:  Taking the dog for a walk.  Taking the stairs instead of the elevator.  Dancing to your favorite song.  Doing an exercise video.  Doing your favorite exercise with a friend. RECOMMENDATIONS FOR EXERCISING WITH TYPE 1 OR TYPE 2 DIABETES   Check your blood glucose before exercising. If blood glucose levels are greater than 240 mg/dL, check for urine ketones. Do not exercise if ketones are present.  Avoid injecting insulin into areas of the body that are going to be exercised. For example, avoid injecting insulin into:  The arms when playing tennis.  The legs when jogging.  Keep a record of:  Food intake before and after you exercise.  Expected peak times of insulin action.  Blood glucose levels before and after you exercise.  The type and amount of exercise you have done.  Review your records with your health care provider. Your health care provider will help you to develop guidelines for adjusting food intake and insulin amounts before and after exercising.  If you take insulin or oral hypoglycemic agents, watch for signs and symptoms of  hypoglycemia. They include:  Dizziness.  Shaking.  Sweating.  Chills.  Confusion.  Drink plenty of water while you exercise to prevent dehydration or heat stroke. Body water is lost during exercise and must be replaced.  Talk to your health care provider before starting an exercise program to make sure it is safe for you. Remember, almost any type of activity is better than none. Document Released: 12/09/2003 Document Revised: 02/02/2014 Document Reviewed: 02/25/2013 [facility] Patient Information 2015 [place], [place]. This information is not intended to replace advice given to you by your health care provider. Make sure you discuss any questions you have with your health care provider. Basic Carbohydrate Counting for Diabetes Mellitus Carbohydrate counting is a method for keeping track of the amount of carbohydrates you eat. Eating carbohydrates naturally increases the level of sugar (glucose) in your blood, so it is important for you to know the amount that is okay for you to have in every meal. Carbohydrate counting helps keep the level of glucose in your blood within normal limits. The amount of carbohydrates allowed is different for every person. A dietitian can help you calculate the amount that is right for you. Once you know the amount of carbohydrates you can have, you can count the carbohydrates in the foods you want to eat. Carbohydrates are found in the following foods:  Grains, such as breads and cereals.  Dried beans and soy products.  Starchy vegetables, such as potatoes, peas, and corn.  Fruit and fruit juices.  Milk and yogurt.  Sweets and snack foods, such as cake, cookies, candy, chips, soft drinks, and fruit drinks. CARBOHYDRATE COUNTING There are two ways to count the carbohydrates in your food. You can use either of the methods or a combination of both. Reading the "Nutrition Facts" on [place] The "Nutrition Facts" is an area that is included on the labels  of almost all packaged food and beverages in the [place]. It includes the serving size of that food or beverage and information about the nutrients in each serving of the food, including the grams (g) of carbohydrate per serving.  Decide the number of servings of this food or beverage that you will be able to eat or drink. Multiply that number of servings by the number of grams of carbohydrate that is listed on the label for that serving. The total will be the amount of carbohydrates you will be having when you eat or drink this food or beverage. Learning Standard Serving Sizes of Food When you eat food that is not packaged  or does not include "Nutrition Facts" on the label, you need to measure the servings in order to count the amount of carbohydrates.A serving of most carbohydrate-rich foods contains about 15 g of carbohydrates. The following list includes serving sizes of carbohydrate-rich foods that provide 15 g ofcarbohydrate per serving:   1 slice of bread (1 oz) or 1 six-inch tortilla.    of a hamburger bun or English muffin.  4-6 crackers.   cup unsweetened dry cereal.    cup hot cereal.   cup rice or pasta.    cup mashed potatoes or  of a large baked potato.  1 cup fresh fruit or one small piece of fruit.    cup canned or frozen fruit or fruit juice.  1 cup milk.   cup plain fat-free yogurt or yogurt sweetened with artificial sweeteners.   cup cooked dried beans or starchy vegetable, such as peas, corn, or potatoes.  Decide the number of standard-size servings that you will eat. Multiply that number of servings by 15 (the grams of carbohydrates in that serving). For example, if you eat 2 cups of strawberries, you will have eaten 2 servings and 30 g of carbohydrates (2 servings x 15 g = 30 g). For foods such as soups and casseroles, in which more than one food is mixed in, you will need to count the carbohydrates in each food that is included. EXAMPLE OF  CARBOHYDRATE COUNTING Sample Dinner  3 oz chicken breast.   cup of brown rice.   cup of corn.  1 cup milk.   1 cup strawberries with sugar-free whipped topping.  Carbohydrate Calculation Step 1: Identify the foods that contain carbohydrates:   Rice.   Corn.   Milk.   Strawberries. Step 2:Calculate the number of servings eaten of each:   2 servings of rice.   1 serving of corn.   1 serving of milk.   1 serving of strawberries. Step 3: Multiply each of those number of servings by 15 g:   2 servings of rice x 15 g = 30 g.   1 serving of corn x 15 g = 15 g.   1 serving of milk x 15 g = 15 g.   1 serving of strawberries x 15 g = 15 g. Step 4: Add together all of the amounts to find the total grams of carbohydrates eaten: 30 g + 15 g + 15 g + 15 g = 75 g. Document Released: 09/18/2005 Document Revised: 02/02/2014 Document Reviewed: 08/15/2013 [facility] Patient Information 2015 [place], [place]. This information is not intended to replace advice given to you by your health care provider. Make sure you discuss any questions you have with your health care provider. DASH Eating Plan DASH stands for "Dietary Approaches to Stop Hypertension." The DASH eating plan is a healthy eating plan that has been shown to reduce high blood pressure (hypertension). Additional health benefits may include reducing the risk of type 2 diabetes mellitus, heart disease, and stroke. The DASH eating plan may also help with weight loss. WHAT DO I NEED TO KNOW ABOUT THE DASH EATING PLAN? For the DASH eating plan, you will follow these general guidelines:  Choose foods with a percent daily value for sodium of less than 5% (as listed on the food label).  Use salt-free seasonings or herbs instead of table salt or sea salt.  Check with your health care provider or pharmacist before using salt substitutes.  Eat lower-sodium products, often labeled as "lower  sodium" or "no salt  added."  Eat fresh foods.  Eat more vegetables, fruits, and low-fat dairy products.  Choose whole grains. Look for the word "whole" as the first word in the ingredient list.  Choose fish and skinless chicken or [place] more often than red meat. Limit fish, poultry, and meat to 6 oz (170 g) each day.  Limit sweets, desserts, sugars, and sugary drinks.  Choose heart-healthy fats.  Limit cheese to 1 oz (28 g) per day.  Eat more home-cooked food and less restaurant, buffet, and fast food.  Limit fried foods.  Cook foods using methods other than frying.  Limit canned vegetables. If you do use them, rinse them well to decrease the sodium.  When eating at a restaurant, ask that your food be prepared with less salt, or no salt if possible. WHAT FOODS CAN I EAT? Seek help from a dietitian for individual calorie needs. Grains Whole grain or whole wheat bread. Brown rice. Whole grain or whole wheat pasta. Quinoa, bulgur, and whole grain cereals. Low-sodium cereals. Corn or whole wheat flour tortillas. Whole grain cornbread. Whole grain crackers. Low-sodium crackers. Vegetables Fresh or frozen vegetables (raw, steamed, roasted, or grilled). Low-sodium or reduced-sodium tomato and vegetable juices. Low-sodium or reduced-sodium tomato sauce and paste. Low-sodium or reduced-sodium canned vegetables.  Fruits All fresh, canned (in natural juice), or frozen fruits. Meat and Other Protein Products Ground beef (85% or leaner), grass-fed beef, or beef trimmed of fat. Skinless chicken or [place]. Ground chicken or [place]. Pork trimmed of fat. All fish and seafood. Eggs. Dried beans, peas, or lentils. Unsalted nuts and seeds. Unsalted canned beans. Dairy Low-fat dairy products, such as skim or 1% milk, 2% or reduced-fat cheeses, low-fat ricotta or cottage cheese, or plain low-fat yogurt. Low-sodium or reduced-sodium cheeses. Fats and Oils Tub margarines without trans fats. Light or reduced-fat  mayonnaise and salad dressings (reduced sodium). Avocado. Safflower, olive, or canola oils. Natural peanut or almond butter. Other Unsalted popcorn and pretzels. The items listed above may not be a complete list of recommended foods or beverages. Contact your dietitian for more options. WHAT FOODS ARE NOT RECOMMENDED? Grains White bread. White pasta. White rice. Refined cornbread. Bagels and croissants. Crackers that contain trans fat. Vegetables Creamed or fried vegetables. Vegetables in a cheese sauce. Regular canned vegetables. Regular canned tomato sauce and paste. Regular tomato and vegetable juices. Fruits Dried fruits. Canned fruit in light or heavy syrup. Fruit juice. Meat and Other Protein Products Fatty cuts of meat. Ribs, chicken wings, bacon, sausage, bologna, salami, chitterlings, fatback, hot dogs, bratwurst, and packaged luncheon meats. Salted nuts and seeds. Canned beans with salt. Dairy Whole or 2% milk, cream, half-and-half, and cream cheese. Whole-fat or sweetened yogurt. Full-fat cheeses or blue cheese. Nondairy creamers and whipped toppings. Processed cheese, cheese spreads, or cheese curds. Condiments Onion and garlic salt, seasoned salt, table salt, and sea salt. Canned and packaged gravies. Worcestershire sauce. Tartar sauce. Barbecue sauce. Teriyaki sauce. Soy sauce, including reduced sodium. Steak sauce. Fish sauce. Oyster sauce. Cocktail sauce. Horseradish. Ketchup and mustard. Meat flavorings and tenderizers. Bouillon cubes. Hot sauce. Tabasco sauce. Marinades. Taco seasonings. Relishes. Fats and Oils Butter, stick margarine, lard, shortening, ghee, and bacon fat. Coconut, palm kernel, or palm oils. Regular salad dressings. Other Pickles and olives. Salted popcorn and pretzels. The items listed above may not be a complete list of foods and beverages to avoid. Contact your dietitian for more information. WHERE CAN I FIND MORE INFORMATION? National Heart, Lung, and  Blood Institute: travelstabloid.com Document Released: 09/07/2011 Document Revised: 02/02/2014 Document Reviewed: 07/23/2013 [facility] Patient Information 2015 [place], [place]. This information is not intended to replace advice given to you by your health care provider. Make sure you discuss any questions you have with your health care provider.

## 2014-12-17 NOTE — Progress Notes (Signed)
Patient presents for CBG and record review, however, patient did not bring log or meter Patient given log, instructed on use and instructed to bring to all future visits Med list reviewed; patient reports taking all meds as directed except takes Januvia 100 mg daily and not metformin. Med list updated to reflect this per last PCP note Patient reports AM fasting blood sugars ranging 69-144 Patient reports before lunch blood sugars ranging 69-144 as well Patient reports before dinner blood sugars ranging 90-96 Patient was checking 90 minutes after start of each meal. Instructed to check at least 2 hours after start of each meal Discussed need for low sodium diet and using Mrs. Dash as alternative to salt Encouraged to choose foods with 5% or less of daily value for sodium. 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 Has been to [facility] in past but not since 2013 Patient states concern about mood swings. Encouraged to return to [facility]. Also given literature on [employer] of the [place] and [employer]  CBG 63 c/o of headache; rates 8/10 at present. Denies all other sx of hypoglycemia Given large cup of fruit punch to drink  BP 118/76 P 94 R 20 T  98.7 oral SpO2 98%  Patient advised to call for med refills at least 7 days before running out so as not to go without. Patient aware that she is to f/u with PCP 2 months from last visit. Due 01/25/2015  Patient given literature on DASH Eating Plan, Diabetes and Food, Diabetes and Exercise, Basic Carb Counting and hypoglycemia, The Plate Method and Living Well with Diabetes S/sx of hypoglycemia reviewed as well as corrective actions to take in detail  Referral to Diabetic  Education and Nutrition placed  CBG 106 15 minutes after fruit punch. Now rates headache 5/10. Patient given crackers and peanut butter and discharged to home in stable condition.

## 2014-12-18 ENCOUNTER — Telehealth: Payer: Self-pay | Admitting: Family Medicine

## 2014-12-18 ENCOUNTER — Telehealth: Payer: Self-pay | Admitting: *Deleted

## 2014-12-18 DIAGNOSIS — E1165 Type 2 diabetes mellitus with hyperglycemia: Secondary | ICD-10-CM

## 2014-12-18 DIAGNOSIS — IMO0002 Reserved for concepts with insufficient information to code with codable children: Secondary | ICD-10-CM

## 2014-12-18 MED ORDER — GLIPIZIDE 10 MG PO TABS: 5.0000 mg | ORAL_TABLET | Freq: Two times a day (BID) | ORAL | Status: DC

## 2014-12-18 NOTE — Telephone Encounter (Signed)
Junious Dresser, please call patient to decrease glipizide to 5 mg with meals with goal of no sugars < 70.

## 2014-12-18 NOTE — Telephone Encounter (Signed)
-----   Message from Velora Heckler, RN sent at 12/17/2014  5:40 PM EDT ----- Regarding: Any changes to meds?  Patient presents for CBG and record review, however, patient did not bring log or meter Patient given log, instructed on use and instructed to bring to all future visits Med list reviewed; patient reports taking all meds as directed except takes Januvia 100 mg daily and not metformin. Med list updated to reflect this per last PCP note Patient reports AM fasting blood sugars ranging 69-144 Patient reports before lunch blood sugars ranging 69-144 as well Patient reports before dinner blood sugars ranging 90-96 Patient was checking 90 minutes after start of each meal. Instructed to check at least 2 hours after start of each meal Discussed need for low sodium diet and using Mrs. Dash as alternative to salt Encouraged to choose foods with 5% or less of daily value for sodium. 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 Has been to [facility] in past but not since 2013 Patient states concern about mood swings. Encouraged to return to [facility]. Also given literature on [employer] of the [place] and [employer]  CBG 63 c/o of headache; rates 8/10 at present. Denies all other sx of hypoglycemia Given large cup of fruit punch to drink  BP 118/76 P 94 R 20 T  98.7 oral SpO2 98%  Patient advised to call for med refills at least 7 days before running out so as not to go without. Patient aware that she is to f/u with PCP 2 months from last visit. Due 01/25/2015  Patient given literature on DASH Eating Plan, Diabetes and Food, Diabetes and Exercise, Basic Carb Counting and hypoglycemia, The Plate Method and Living Well with  Diabetes S/sx of hypoglycemia reviewed as well as corrective actions to take in detail  Referral to Diabetic Education and Nutrition placed  CBG 106 15 minutes after fruit punch. Now rates headache 5/10. Patient given crackers and peanut butter and discharged to home in stable condition.

## 2014-12-21 NOTE — Telephone Encounter (Signed)
error 

## 2014-12-23 ENCOUNTER — Ambulatory Visit: Payer: No Typology Code available for payment source | Admitting: Podiatry

## 2015-01-06 ENCOUNTER — Ambulatory Visit: Payer: No Typology Code available for payment source | Admitting: Podiatry

## 2015-01-14 ENCOUNTER — Ambulatory Visit: Payer: No Typology Code available for payment source

## 2015-01-19 ENCOUNTER — Telehealth: Payer: Self-pay | Admitting: Family Medicine

## 2015-01-19 NOTE — Telephone Encounter (Signed)
Please call patient  Patient missed diabetes education appointment on 4.14.16.  I would very much like the patient to go to diabetes education. She does have the option of seeing the health coach here if she prefers.   If she would rather get diabetes coaching on site, please route her to the scheduler to schedule If she would like to get it at the diabetes edu center she can call [phone].

## 2015-01-20 ENCOUNTER — Telehealth: Payer: Self-pay | Admitting: *Deleted

## 2015-01-20 NOTE — Telephone Encounter (Signed)
Pt has DM education on 01/20/2014.  Called for reminder    Boykin Nearing, MD at 01/19/2015 9:30 AM    Status: Signed      Expand All Collapse All    Please call patient    Patient missed diabetes education appointment on 4.14.16.  I would very much like the patient to go to diabetes education. She does have the option of seeing the health coach here if she prefers.   If she would rather get diabetes coaching on site, please route her to the scheduler to schedule If she would like to get it at the diabetes edu center she can call [phone].

## 2015-01-21 ENCOUNTER — Ambulatory Visit: Payer: No Typology Code available for payment source

## 2015-01-26 ENCOUNTER — Ambulatory Visit: Payer: No Typology Code available for payment source | Admitting: Family Medicine

## 2015-01-28 ENCOUNTER — Ambulatory Visit: Payer: No Typology Code available for payment source

## 2015-02-10 ENCOUNTER — Ambulatory Visit: Payer: No Typology Code available for payment source | Attending: Family Medicine | Admitting: Family Medicine

## 2015-02-10 ENCOUNTER — Encounter: Payer: Self-pay | Admitting: Family Medicine

## 2015-02-10 VITALS — BP 118/77 | HR 82 | Temp 98.4°F | Resp 18 | Ht 66.0 in | Wt 218.0 lb

## 2015-02-10 DIAGNOSIS — I10 Essential (primary) hypertension: Secondary | ICD-10-CM | POA: Insufficient documentation

## 2015-02-10 DIAGNOSIS — E1165 Type 2 diabetes mellitus with hyperglycemia: Secondary | ICD-10-CM | POA: Diagnosis not present

## 2015-02-10 DIAGNOSIS — Z23 Encounter for immunization: Secondary | ICD-10-CM | POA: Diagnosis not present

## 2015-02-10 DIAGNOSIS — Z87891 Personal history of nicotine dependence: Secondary | ICD-10-CM | POA: Insufficient documentation

## 2015-02-10 DIAGNOSIS — E1169 Type 2 diabetes mellitus with other specified complication: Secondary | ICD-10-CM | POA: Diagnosis not present

## 2015-02-10 DIAGNOSIS — IMO0002 Reserved for concepts with insufficient information to code with codable children: Secondary | ICD-10-CM

## 2015-02-10 DIAGNOSIS — E785 Hyperlipidemia, unspecified: Secondary | ICD-10-CM | POA: Diagnosis not present

## 2015-02-10 DIAGNOSIS — N926 Irregular menstruation, unspecified: Secondary | ICD-10-CM | POA: Insufficient documentation

## 2015-02-10 LAB — POCT LIPID PANEL
HDL: 43
LDL: 121
Non-HDL: 138
TC/HDL: 4.2
TC: 181
TRG: 83

## 2015-02-10 LAB — GLUCOSE, POCT (MANUAL RESULT ENTRY): POC Glucose: 132 mg/dl — AB (ref 70–99)

## 2015-02-10 LAB — POCT GLYCOSYLATED HEMOGLOBIN (HGB A1C): HEMOGLOBIN A1C: 7.8

## 2015-02-10 MED ORDER — ATORVASTATIN CALCIUM 40 MG PO TABS: 40.0000 mg | ORAL_TABLET | Freq: Every day | ORAL | Status: DC

## 2015-02-10 NOTE — Progress Notes (Signed)
   Subjective:    Patient ID: Sheila Bishop, female    DOB: [birth year], 44 y.o.   MRN: [id number] CC: f/u HTN and DM HPI  1. CHRONIC DIABETES  Disease Monitoring  Blood Sugar Ranges: < 200, fasting around 130  Polyuria: no   Visual problems: no   Medication Compliance: yes  Medication Side Effects  Hypoglycemia: no   Preventitive Health Care  Eye Exam: due needs to reschedule with Dr. Threasa Alpha Exam: done   Diet pattern: regular meals   Exercise: starting with goal of weight loss   2. CHRONIC HYPERTENSION  Disease Monitoring  Blood pressure range: not check   Chest pain: no   Dyspnea: no   Claudication: no   Medication compliance: yes,   Medication Side Effects  Lightheadedness: no   Urinary frequency: no   Edema: no    3. Irregular cycle: 01/01/15-01/06/15. Again 3rd week of April. LMP 02/09/15. S/p normal endometrial biospy and normal pelvic US.   4. HM: due for Tdap. Amenable to Tdap.   soc Hx: former smoker quit in 2009 Review of Systems     Objective:   Physical Exam BP 118/77 mmHg  Pulse 82  Temp(Src) 98.4 F (36.9 C) (Oral)  Resp 18  Ht 5\' 6"  (1.676 m)  Wt 218 lb (98.884 kg)  BMI 35.20 kg/m2  SpO2 100%  LMP 02/09/2015  Wt Readings from Last 3 Encounters:  02/10/15 218 lb (98.884 kg)  11/26/14 213 lb (96.616 kg)  11/02/14 206 lb 9.1 oz (93.7 kg)  General appearance: alert, cooperative and no distress Lungs: clear to auscultation bilaterally Heart: regular rate and rhythm, S1, S2 normal, no murmur, click, rub or gallop Extremities: extremities normal, atraumatic, no cyanosis or edema  CBG  132  Lab Results  Component Value Date   HGBA1C 7.80 02/10/2015         Assessment & Plan:

## 2015-02-10 NOTE — Assessment & Plan Note (Signed)
  2. HTN:  Goal BP < 140/90 You are there! Continue lisinopril 40 mg daily

## 2015-02-10 NOTE — Addendum Note (Signed)
Addended by: Boykin Nearing on: 02/10/2015 10:22 AM   Modules accepted: Orders

## 2015-02-10 NOTE — Progress Notes (Signed)
F/U DM Glucose running between 118 -200  Stated manteining  a good diet

## 2015-02-10 NOTE — Patient Instructions (Addendum)
Ms. Ochs,  Thank you for coming in today. You are a super star and you have gotten your BP and blood sugar well controlled!  1. Diabetes: A1c came down with glipizide and your excellent commitment to a low carb diet.  Goal A1c < 7, very close to goal! Continue glipizide Continue to exercise try to get in 3-4 times per week Continue healthy diet  Monitor sugars  Diabetes  Check blood sugar day fasting and before meals - 2-3 times per day Goal fasting 100 (90-110) Goal after eating < 160 Beware of hypoglycemia (low blood sugar) which is blood sugar < 70 with or without symptoms  2. HTN:  Goal BP < 140/90 You are there! Continue lisinopril 40 mg daily   3. Healthcare maintenance: Tdap done today Due for diabetic eye exam Please call Dr. Gershon Crane to re-schedule  Address: [address], [place], [place] [address]  [phone]  F/u with Ander Purpura in 6 weeks for diabetes check in/eduation  F/u with me in 3 months, we will repeat your A1c  Dr. Adrian Blackwater

## 2015-02-10 NOTE — Assessment & Plan Note (Signed)
A: dyslipidemia with diabetes P: Start lipitor 40 mg daily

## 2015-02-10 NOTE — Assessment & Plan Note (Addendum)
A: Diabetes: A1c came down with glipizide and your excellent commitment to a low carb diet.  Goal A1c < 7, very close to goal! P: poct lipids today  Continue glipizide Continue to exercise try to get in 3-4 times per week Continue healthy diet  Monitor sugars  Diabetes  Check blood sugar day fasting and before meals - 2-3 times per day Goal fasting 100 (90-110) Goal after eating < 160 Beware of hypoglycemia (low blood sugar) which is blood sugar < 70 with or without symptoms

## 2015-02-10 NOTE — Assessment & Plan Note (Signed)
A: menstrual irregularity with normal w/u  P:  Reassurance

## 2015-02-10 NOTE — Addendum Note (Signed)
Addended by: Boykin Nearing on: 02/10/2015 10:18 AM   Modules accepted: Orders

## 2015-10-04 MED FILL — LISINOPRIL 40 MG TABLET: 40 | 30 days supply | Qty: 30 | Fill #10

## 2015-10-04 MED FILL — glipiZIDE 10 MG TABS: 10 | 30 days supply | Qty: 30 | Fill #9

## 2015-10-06 MED FILL — ATORVASTATIN 40 MG TABLET: 40 | 30 days supply | Qty: 30 | Fill #4

## 2015-11-10 MED FILL — glipiZIDE 10 MG TABS: 10 | 30 days supply | Qty: 30 | Fill #10

## 2015-11-10 MED FILL — LISINOPRIL 40 MG TABLET: 40 | 30 days supply | Qty: 30 | Fill #11

## 2015-11-10 MED FILL — ATORVASTATIN 40 MG TABLET: 40 | 30 days supply | Qty: 30 | Fill #5

## 2015-11-18 IMAGING — US US TRANSVAGINAL NON-OB
1 series · 13 of 25 positions shown · non-contrast
Comparison: None

CLINICAL DATA: Abnormal bleeding.  Elevated white blood cell count.

EXAM:
TRANSABDOMINAL AND TRANSVAGINAL ULTRASOUND OF PELVIS
TECHNIQUE: Both transabdominal and transvaginal ultrasound examinations of the
pelvis were performed. Transabdominal technique was performed for
global imaging of the pelvis including uterus, ovaries, adnexal
regions, and pelvic cul-de-sac. It was necessary to proceed with
endovaginal exam following the transabdominal exam to visualize the
uterus and ovaries.

[Series 1: us pelvis complete · 13 of 65 slices shown]
[im 1/65]
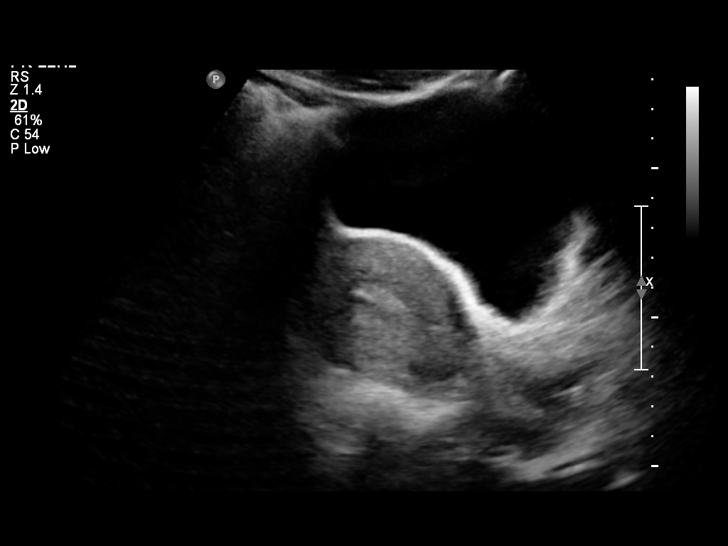
[im 6/65]
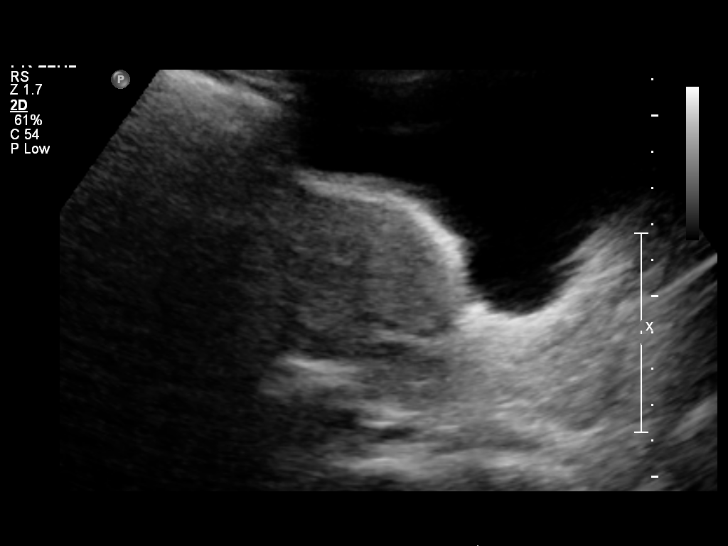
[im 11/65]
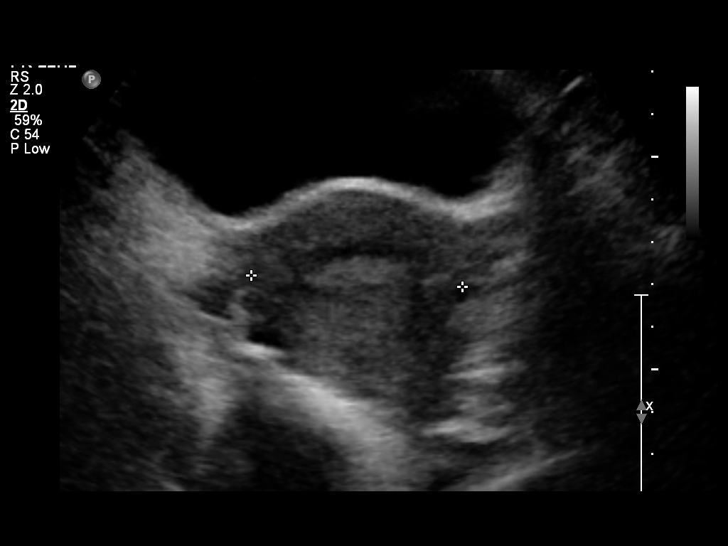
[im 17/65]
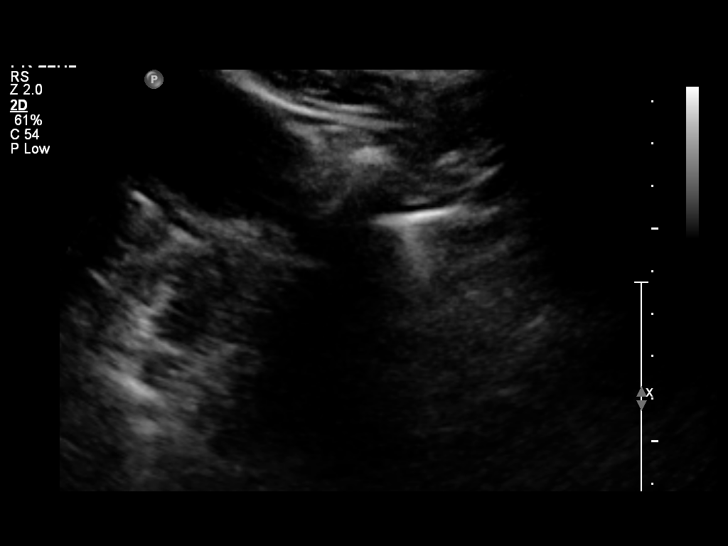
[im 22/65]
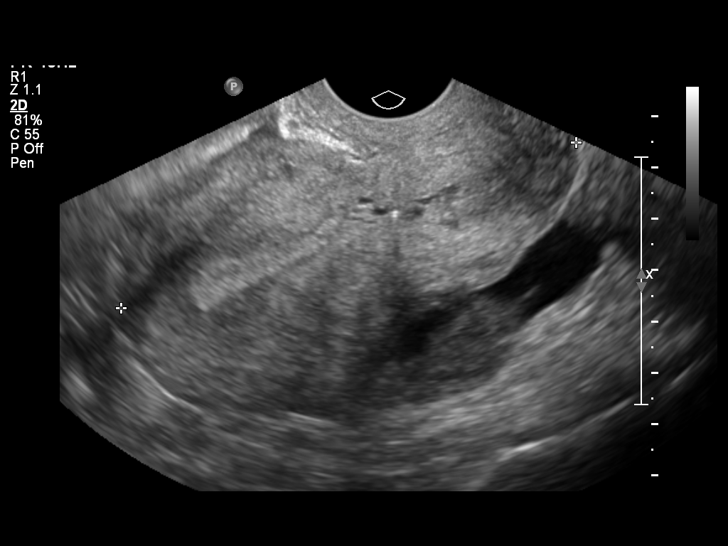
[im 27/65]
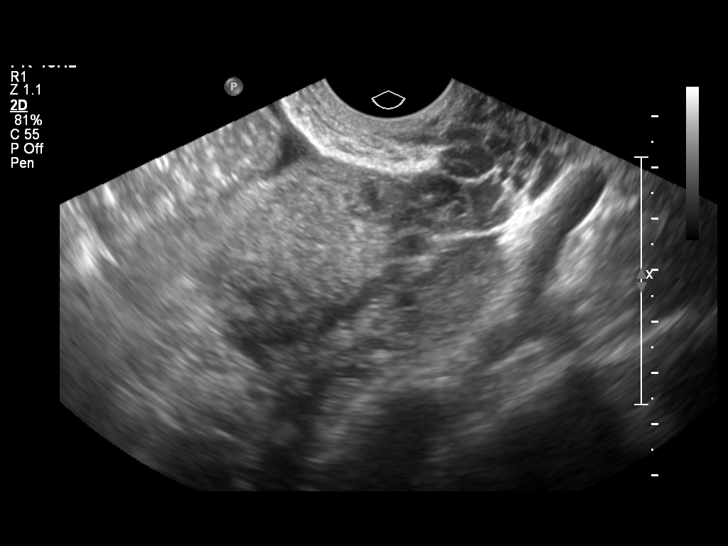
[im 33/65]
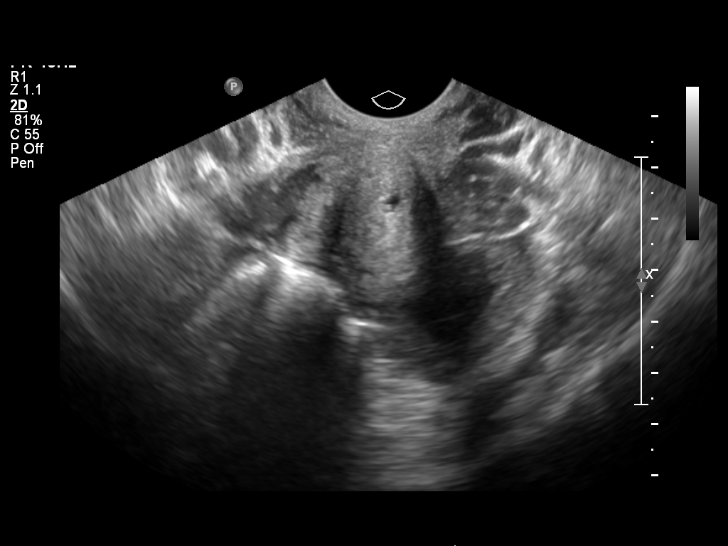
[im 38/65]
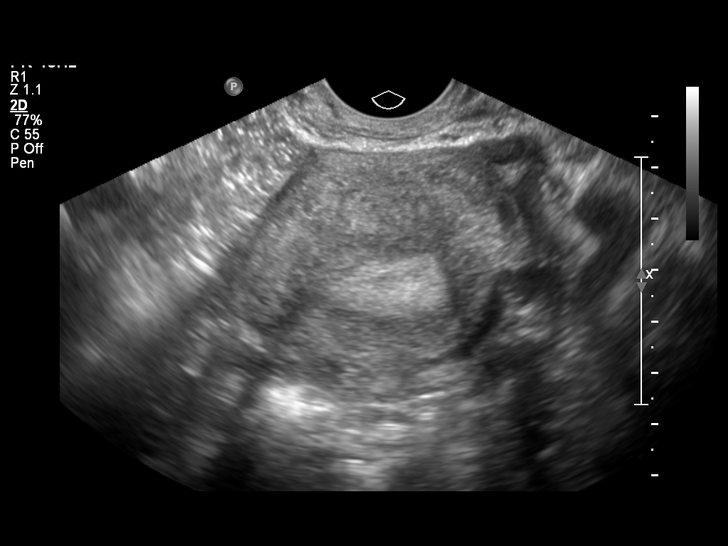
[im 43/65]
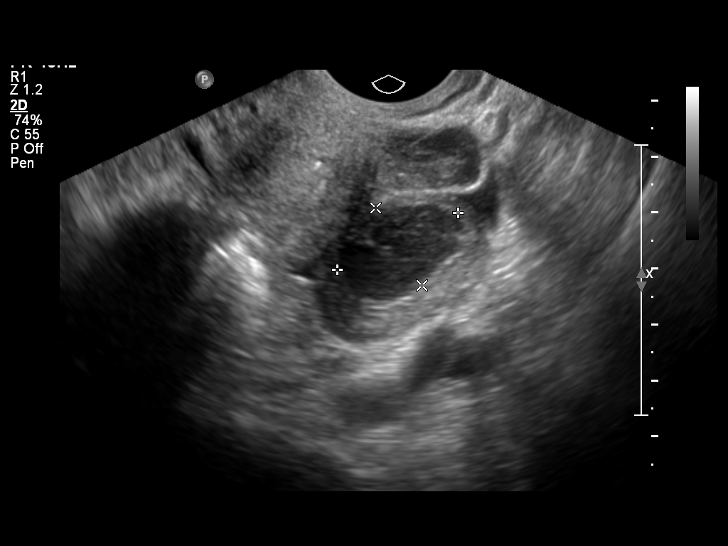
[im 49/65]
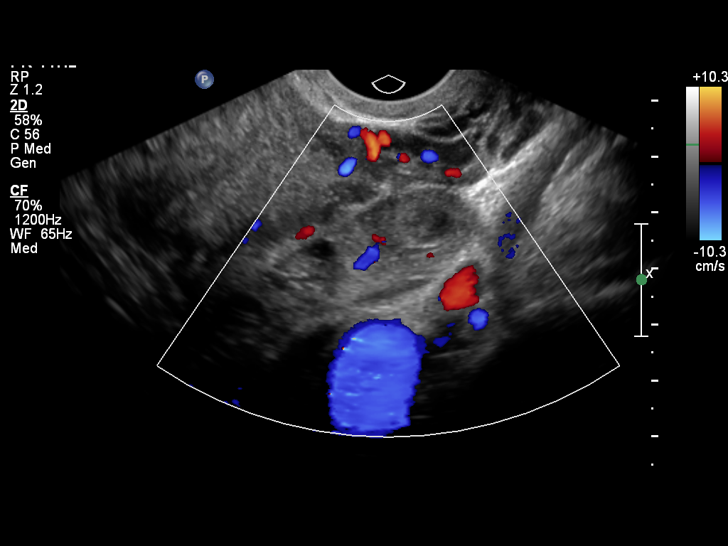
[im 54/65]
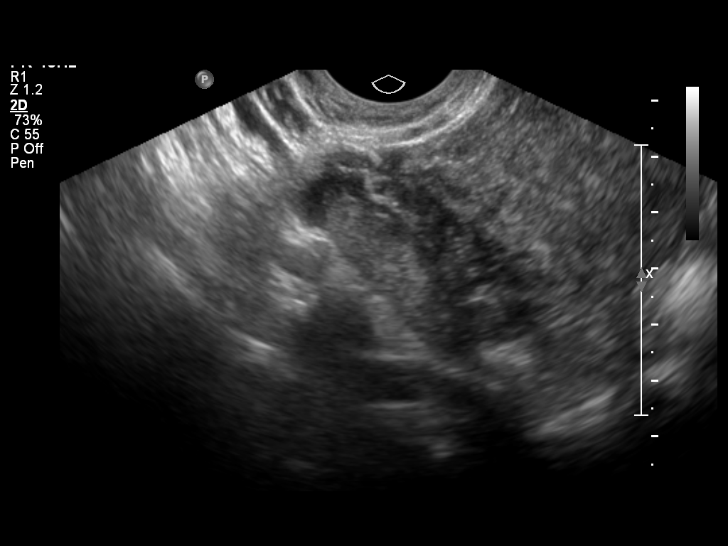
[im 59/65]
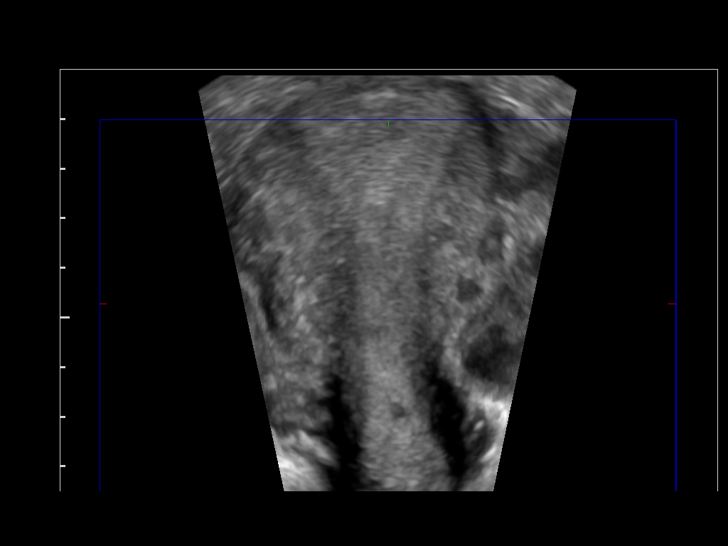
[im 65/65]
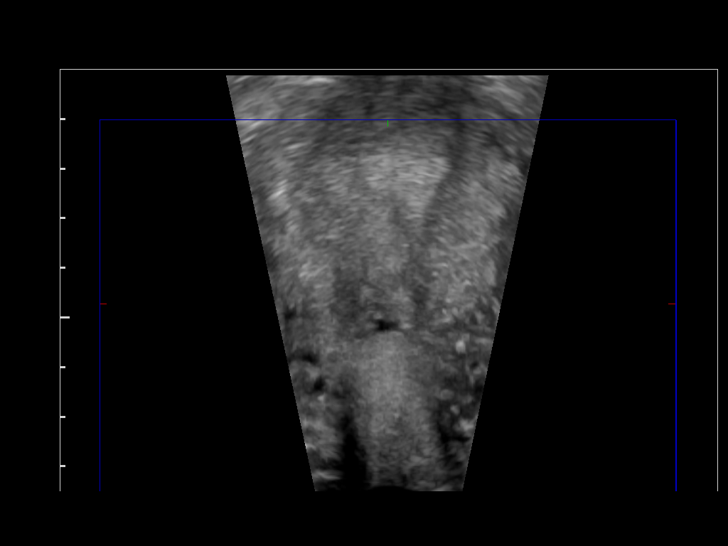

[13 of 25 positions shown; findings below may reference images not displayed]

FINDINGS: Uterus

Measurements: 9.4 x 5.3 x 5.4 cm. No fibroids or other mass
visualized.

Endometrium

Thickness: 7.1 mm.  No focal abnormality visualized.

Right ovary

Measurements: 3.8 x 1.2 x 2.1 cm. Normal appearance/no adnexal mass.

Left ovary

Measurements: 4.0 x 2.5 x 2.2 cm. 2.4 x 1.6 x 2.1 cm complex cyst
left ovary. Given the patient has an elevated white blood cell
count, a developing very tiny abscess cannot be excluded. No large
abscess. Pregnancy test suggested to exclude ectopic pregnancy.
Follow-up pelvic ultrasound can be obtained to demonstrate
resolution.

Other findings

Trace.
IMPRESSION: 2.4 x 1.6 x 2.1 cm complex cyst left ovary. If the patient has
elevated white blood cell count, 8 developing very tiny abscess/PID
cannot be completely excluded. No large abscess identified.
Follow-up pelvic ultrasound can be obtained to demonstrate
resolution. Pregnancy test suggested to exclude ectopic pregnancy.

## 2015-12-13 ENCOUNTER — Other Ambulatory Visit: Payer: Self-pay | Admitting: Family Medicine

## 2015-12-13 MED FILL — LISINOPRIL 40 MG TABLET: 40 | 30 days supply | Qty: 30 | Fill #0

## 2015-12-13 MED FILL — ATORVASTATIN 40 MG TABLET: 40 | 30 days supply | Qty: 30 | Fill #6

## 2015-12-13 MED FILL — glipiZIDE 10 MG TABS: 10 | 30 days supply | Qty: 30 | Fill #11

## 2016-01-24 ENCOUNTER — Telehealth: Payer: Self-pay | Admitting: Family Medicine

## 2016-01-24 NOTE — Telephone Encounter (Signed)
Patient called and requested a med refill for all of her current medications, please f/u with pt.

## 2016-01-24 NOTE — Telephone Encounter (Signed)
Unable to contact pt no voice mail  Pt need OV for medicine refills  Will refill one month only when pt call back for appointment

## 2016-01-25 ENCOUNTER — Ambulatory Visit: Payer: Self-pay | Admitting: Family Medicine

## 2016-02-02 ENCOUNTER — Telehealth: Payer: Self-pay | Admitting: Family Medicine

## 2016-02-02 NOTE — Telephone Encounter (Signed)
Patient has been switched to different PCP.  Patient can only come in to see doctor on Wednesday....Marland KitchenMarland Kitchen

## 2016-02-09 ENCOUNTER — Encounter (HOSPITAL_COMMUNITY): Payer: Self-pay | Admitting: Emergency Medicine

## 2016-02-09 ENCOUNTER — Emergency Department (HOSPITAL_COMMUNITY): Payer: MEDICAID

## 2016-02-09 ENCOUNTER — Emergency Department (HOSPITAL_COMMUNITY)
Admission: EM | Admit: 2016-02-09 | Discharge: 2016-02-09 | Disposition: A | Discharge status: Home / Self Care | Payer: Self-pay | Attending: Emergency Medicine | Admitting: Emergency Medicine

## 2016-02-09 DIAGNOSIS — R000 Tachycardia, unspecified: Secondary | ICD-10-CM | POA: Insufficient documentation

## 2016-02-09 DIAGNOSIS — R739 Hyperglycemia, unspecified: Secondary | ICD-10-CM

## 2016-02-09 DIAGNOSIS — E1165 Type 2 diabetes mellitus with hyperglycemia: Secondary | ICD-10-CM | POA: Insufficient documentation

## 2016-02-09 DIAGNOSIS — Z973 Presence of spectacles and contact lenses: Secondary | ICD-10-CM | POA: Insufficient documentation

## 2016-02-09 DIAGNOSIS — M542 Cervicalgia: Secondary | ICD-10-CM | POA: Insufficient documentation

## 2016-02-09 DIAGNOSIS — Z872 Personal history of diseases of the skin and subcutaneous tissue: Secondary | ICD-10-CM | POA: Insufficient documentation

## 2016-02-09 DIAGNOSIS — R590 Localized enlarged lymph nodes: Secondary | ICD-10-CM | POA: Insufficient documentation

## 2016-02-09 DIAGNOSIS — E669 Obesity, unspecified: Secondary | ICD-10-CM | POA: Insufficient documentation

## 2016-02-09 DIAGNOSIS — Z87891 Personal history of nicotine dependence: Secondary | ICD-10-CM | POA: Insufficient documentation

## 2016-02-09 DIAGNOSIS — R591 Generalized enlarged lymph nodes: Secondary | ICD-10-CM

## 2016-02-09 DIAGNOSIS — I10 Essential (primary) hypertension: Secondary | ICD-10-CM | POA: Insufficient documentation

## 2016-02-09 DIAGNOSIS — Z79899 Other long term (current) drug therapy: Secondary | ICD-10-CM | POA: Insufficient documentation

## 2016-02-09 DIAGNOSIS — Z7984 Long term (current) use of oral hypoglycemic drugs: Secondary | ICD-10-CM | POA: Insufficient documentation

## 2016-02-09 DIAGNOSIS — F419 Anxiety disorder, unspecified: Secondary | ICD-10-CM | POA: Insufficient documentation

## 2016-02-09 LAB — CBC WITH DIFFERENTIAL/PLATELET
BASOS PCT: 0 %
Basophils Absolute: 0 10*3/uL (ref 0.0–0.1)
EOS ABS: 0.2 10*3/uL (ref 0.0–0.7)
EOS PCT: 1 %
HCT: 33 % — ABNORMAL LOW (ref 36.0–46.0)
Hemoglobin: 11.2 g/dL — ABNORMAL LOW (ref 12.0–15.0)
LYMPHS ABS: 3.4 10*3/uL (ref 0.7–4.0)
Lymphocytes Relative: 21 %
MCH: 25.8 pg — AB (ref 26.0–34.0)
MCHC: 33.9 g/dL (ref 30.0–36.0)
MCV: 76 fL — ABNORMAL LOW (ref 78.0–100.0)
MONOS PCT: 6 %
Monocytes Absolute: 1 10*3/uL (ref 0.1–1.0)
Neutro Abs: 11.2 10*3/uL — ABNORMAL HIGH (ref 1.7–7.7)
Neutrophils Relative %: 72 %
PLATELETS: 344 10*3/uL (ref 150–400)
RBC: 4.34 MIL/uL (ref 3.87–5.11)
RDW: 13.7 % (ref 11.5–15.5)
WBC: 15.8 10*3/uL — ABNORMAL HIGH (ref 4.0–10.5)

## 2016-02-09 LAB — BASIC METABOLIC PANEL
Anion gap: 12 (ref 5–15)
BUN: 7 mg/dL (ref 6–20)
CO2: 24 mmol/L (ref 22–32)
CREATININE: 0.57 mg/dL (ref 0.44–1.00)
Calcium: 9.3 mg/dL (ref 8.9–10.3)
Chloride: 97 mmol/L — ABNORMAL LOW (ref 101–111)
GFR calc non Af Amer: >60 mL/min (ref >60)
Glucose, Bld: 362 mg/dL — ABNORMAL HIGH (ref 65–99)
Potassium: 3.7 mmol/L (ref 3.5–5.1)
Sodium: 133 mmol/L — ABNORMAL LOW (ref 135–145)

## 2016-02-09 MED ORDER — IOPAMIDOL (ISOVUE-300) INJECTION 61%
INTRAVENOUS | Status: AC
  Administered 2016-02-09: 75 mL
  Filled 2016-02-09: qty 75

## 2016-02-09 MED ORDER — HYDROCODONE-ACETAMINOPHEN 5-325 MG PO TABS: 1.0000 | ORAL_TABLET | Freq: Four times a day (QID) | ORAL | Status: DC | PRN

## 2016-02-09 MED ORDER — SODIUM CHLORIDE 0.9 % IV BOLUS (SEPSIS)
1000.0000 mL | Freq: Once | INTRAVENOUS | Status: AC
Start: 2016-02-09 — End: 2016-02-09
  Administered 2016-02-09: 1000 mL via INTRAVENOUS

## 2016-02-09 MED ORDER — ONDANSETRON HCL 4 MG/2ML IJ SOLN
4.0000 mg | Freq: Once | INTRAMUSCULAR | Status: AC
  Administered 2016-02-09: 4 mg via INTRAVENOUS
  Filled 2016-02-09: qty 2

## 2016-02-09 MED ORDER — OXYCODONE-ACETAMINOPHEN 5-325 MG PO TABS
ORAL_TABLET | ORAL | Status: AC
  Administered 2016-02-09: 1 via ORAL
  Filled 2016-02-09: qty 1

## 2016-02-09 MED ORDER — IBUPROFEN 800 MG PO TABS: 800.0000 mg | ORAL_TABLET | Freq: Three times a day (TID) | ORAL | Status: DC

## 2016-02-09 MED ORDER — MORPHINE SULFATE (PF) 4 MG/ML IV SOLN
4.0000 mg | Freq: Once | INTRAVENOUS | Status: AC
Start: 2016-02-09 — End: 2016-02-09
  Administered 2016-02-09: 4 mg via INTRAVENOUS
  Filled 2016-02-09: qty 1

## 2016-02-09 MED ORDER — OXYCODONE-ACETAMINOPHEN 5-325 MG PO TABS
1.0000 | ORAL_TABLET | ORAL | Status: DC | PRN
  Administered 2016-02-09: 1 via ORAL

## 2016-02-09 NOTE — ED Provider Notes (Signed)
CSN: [id number]     Arrival date & time 02/09/16  1927 History  By signing my name below, I, [name], attest that this documentation has been prepared under the direction and in the presence of [name], [place]. Electronically Signed: Judithann Sauger, ED Scribe. 02/09/2016. 8:00 PM.    Chief Complaint  Patient presents with  . Facial Swelling   The history is provided by the patient. No language interpreter was used.   HPI Comments: Sheila Bishop is a 45 y.o. female with a hx of HTN and DM who presents to the Emergency Department complaining of gradually worsening painful right lateral lower facial and right neck swelling onset yesterday. Pt explains that her supervisor told her that her right face was swollen while at work yesterday and she has had increase pain since this am. No alleviating factors noted. She states that she tried Ibuprofen last night with temporary relief. Pt denies any pain inside her mouth. She also denies grinding her teeth tightly or any recent injuries. Pt denies any hx of thyroid issues. No fever, chills, dental problems, or any n/v. Denies dysphagia, drooling.Denies chest pain or SOB.   Past Medical History  Diagnosis Date  . Breast abscess Nov 2012    left  . Headache(784.0)   . Wears glasses   . Obesity   . Depression 2007  . Diabetes mellitus 2009  . Hypertension 2009   Past Surgical History  Procedure Laterality Date  . Breast biopsy  08/07/2011    Procedure: BREAST BIOPSY;  Surgeon: Joyice Faster. Cornett, MD;  Location: [facility];  Service: General;  Laterality: Left;  BIOPSY BREAST LEFT SIDE  . Cesarean section  2002   Family History  Problem Relation Age of Onset  . Hyperlipidemia Mother   . Hypertension Mother   . Heart disease Mother    Social History  Substance Use Topics  . Smoking status: Former Smoker    Quit date: 12/31/2007  . Smokeless tobacco: None  . Alcohol Use: No   OB History    Gravida Para Term Preterm AB TAB SAB  Ectopic Multiple Living   _0 0 _1 Review of Systems  Constitutional: Negative for fever and chills.  HENT: Positive for facial swelling. Negative for dental problem.   Gastrointestinal: Negative for nausea and vomiting.  All other systems reviewed and are negative.     Allergies  Review of patient's allergies indicates no known allergies.  Home Medications   Prior to Admission medications   Medication Sig Start Date End Date Taking? Authorizing Provider  ACCU-CHEK FASTCLIX LANCETS MISC 1 each by Does not apply route 5 (five) times daily. 11/26/14   Josalyn Funches, MD  atorvastatin (LIPITOR) 40 MG tablet Take 1 tablet (40 mg total) by mouth daily. 02/10/15   Josalyn Funches, MD  Blood Glucose Monitoring Suppl (ACCU-CHEK NANO SMARTVIEW) W/DEVICE KIT 1 Device by Does not apply route as needed. 11/26/14   Josalyn Funches, MD  glipiZIDE (GLUCOTROL) 10 MG tablet Take 0.5 tablets (5 mg total) by mouth 2 (two) times daily before a meal. 12/18/14   Josalyn Funches, MD  glucose blood (ACCU-CHEK SMARTVIEW) test strip 1 each by Other route 5 (five) times daily. 11/26/14   Josalyn Funches, MD  lisinopril (PRINIVIL,ZESTRIL) 40 MG tablet Take 1 tablet (40 mg total) by mouth daily. Needs office visit for refills 12/13/15   Boykin Nearing, MD  terbinafine (LAMISIL) 250 MG tablet Take  1 tablet (250 mg total) by mouth daily. For 12 weeks 11/26/14   Josalyn Funches, MD   BP 182/107 mmHg  Pulse 125  Temp(Src) 99 F (37.2 C) (Oral)  Resp 20  Ht _0  (1.676 m)  Wt 219 lb 1 oz (99.366 kg)  BMI 35.37 kg/m2  SpO2 98% Physical Exam  Constitutional: She is oriented to person, place, and time. She appears well-developed and well-nourished.  Appears uncomfortable  HENT:  Head: Normocephalic and atraumatic.  Minimal right jaw and neck swelling. No sublingual edema or tenderness. No submental edema or tenderness. No posterior oropharyngeal edema or erythema. No dental caries or abscesses noted.  Gingiva NL.   Neck: Normal range of motion. Neck supple.  FROM of neck though pt states is painful. No midline tenderness. Diffuse right sided ttp. No bruits.   Cardiovascular: Regular rhythm and normal heart sounds.  Tachycardia present.   Pulmonary/Chest: Effort normal and breath sounds normal. No respiratory distress. She has no wheezes.  Neurological: She is alert and oriented to person, place, and time.  Skin: Skin is warm and dry.  Psychiatric: Her mood appears anxious.  Nursing note and vitals reviewed.   ED Course  Procedures (including critical care time) DIAGNOSTIC STUDIES: Oxygen Saturation is 98% on RA, normal by my interpretation.    COORDINATION OF CARE: 8:00 PM- Pt advised of plan for treatment and pt agrees. Pt will receive lab work for further evaluation.    Labs Review Labs Reviewed  CBC WITH DIFFERENTIAL/PLATELET - Abnormal; Notable for the following:    WBC 15.8 (*)    Hemoglobin 11.2 (*)    HCT 33.0 (*)    MCV 76.0 (*)    MCH 25.8 (*)    Neutro Abs 11.2 (*)    All other components within normal limits  BASIC METABOLIC PANEL - Abnormal; Notable for the following:    Sodium 133 (*)    Chloride 97 (*)    Glucose, Bld 362 (*)    All other components within normal limits    Imaging Review No results found.   Delrae Rend, PA-C has personally reviewed and evaluated these images and lab results as part of her medical decision-making.  MDM   Final diagnoses:  None    BP elevated in triage. Pt states she is out of all her home meds but has appt with PCP this month for f/u for refill. Suspect pain is also contributing to her elevated BP and HR. Discussed case with attending Dr. Regenia Skeeter. Given pt's clinical presentation and concern regarding neck pain that she states is increasing in severity, will obtain CT neck to r/o infection or other acute process. Low suspicion for dissection at this time. Basic labs including CBC and BMP are pending. Pt as given one  percocet in triage. Will give dose of IV pain meds.  CBC at baseline with leukocytosis and anemia of 15.8, H/H 11.2/33.0. BMP reveals glucose of 362, Sodium of 133. Corrected Na 137. CT soft tissue is pending at time of shift end. Will sign out to [employer], PA-C. If CT negative and heomdynamically stable will d/c home. Ibuprofen as needed for pain and will give short course of norco prn.   I personally performed the services described in this documentation, which was scribed in my presence. The recorded information has been reviewed and is accurate.   Anne Ng, PA-C 02/10/16 1338  Sherwood Gambler, MD 02/10/16 1520

## 2016-02-09 NOTE — ED Notes (Signed)
Patient transported to CT 

## 2016-02-09 NOTE — ED Provider Notes (Signed)
PROGRESS NOTE                                                                                                                 This is a sign-out from PA Sam at shift change: Sheila Bishop is a 45 y.o. female presenting with neck swelling and discomfort. Full range of motion. Plan is to follow-up CT scan. Please refer to previous note for full HPI, ROS, PMH and PE.   CT with mild lymphadenopathy bilaterally throughout the neck, patient states that she's had no irritation or upper respiratory infection, I explained to her that this could be secondary to an abnormality in her blood line, patient will be given a heme" referral, she understands it is very important that she follows up for further evaluation. Patient verbalized her understanding.   Monico Blitz, PA-C 02/09/16 [id number]  Sherwood Gambler, MD 02/09/16 2352

## 2016-02-09 NOTE — ED Notes (Signed)
Pt. reports right lateral facial pain with mild swelling onset yesterday , denies fever or chills , no injury . Hypertensive/tachycardic at triage .

## 2016-02-09 NOTE — ED Notes (Signed)
Dr. Verneda Skill notified on pt.'s elevated blood pressure and tachycardia , no orders received.

## 2016-02-09 NOTE — Discharge Instructions (Signed)
Your CT scan today shows enlarged lymph nodes. While this may be just a reaction to inflammation, irritation or infection it could signify that there is something wrong with your blood lines. Please make an appointment with the hematologist for further evaluation. Your labs today showed some elevated blood glucose but were otherwise at your baseline. You may continue taking ibuprofen as needed for your neck pain. You may take up to 800mg  (four regular strength pills) three times a day. I gave you a prescription for vicodin as needed for severe pain. Please follow up with your primary care provider within one week. Remember to discuss refill of all of your regular home medications and start taking them again as soon as possible. Return to the emergency room for new or worsening symptoms.    Lymphadenopathy Lymphadenopathy refers to swollen or enlarged lymph glands, also called lymph nodes. Lymph glands are part of your body's defense (immune) system, which protects the body from infections, germs, and diseases. Lymph glands are found in many locations in your body, including the neck, underarm, and groin.  Many things can cause lymph glands to become enlarged. When your immune system responds to germs, such as viruses or bacteria, infection-fighting cells and fluid build up. This causes the glands to grow in size. Usually, this is not something to worry about. The swelling and any soreness often go away without treatment. However, swollen lymph glands can also be caused by a number of diseases. Your health care provider may do various tests to help determine the cause. If the cause of your swollen lymph glands cannot be found, it is important to monitor your condition to make sure the swelling goes away. HOME CARE INSTRUCTIONS Watch your condition for any changes. The following actions may help to lessen any discomfort you are feeling:  Get plenty of rest.  Take medicines only as directed by your health  care provider. Your health care provider may recommend over-the-counter medicines for pain.  Apply moist heat compresses to the site of swollen lymph nodes as directed by your health care provider. This can help reduce any pain.  Check your lymph nodes daily for any changes.  Keep all follow-up visits as directed by your health care provider. This is important. SEEK MEDICAL CARE IF:  Your lymph nodes are still swollen after 2 weeks.  Your swelling increases or spreads to other areas.  Your lymph nodes are hard, seem fixed to the skin, or are growing rapidly.  Your skin over the lymph nodes is red and inflamed.  You have a fever.  You have chills.  You have fatigue.  You develop a sore throat.  You have abdominal pain.  You have weight loss.  You have night sweats. SEEK IMMEDIATE MEDICAL CARE IF:  You notice fluid leaking from the area of the enlarged lymph node.  You have severe pain in any area of your body.  You have chest pain.  You have shortness of breath.   This information is not intended to replace advice given to you by your health care provider. Make sure you discuss any questions you have with your health care provider.   Document Released: 06/27/2008 Document Revised: 10/09/2014 Document Reviewed: 04/23/2014 Elsevier Interactive Patient Education [employer].

## 2016-02-23 ENCOUNTER — Other Ambulatory Visit: Payer: Self-pay | Admitting: Family Medicine

## 2016-02-23 ENCOUNTER — Ambulatory Visit: Payer: Self-pay | Attending: Family Medicine | Admitting: Family Medicine

## 2016-02-23 ENCOUNTER — Encounter: Payer: Self-pay | Admitting: Family Medicine

## 2016-02-23 ENCOUNTER — Ambulatory Visit: Payer: Self-pay

## 2016-02-23 ENCOUNTER — Encounter (INDEPENDENT_AMBULATORY_CARE_PROVIDER_SITE_OTHER): Payer: Self-pay

## 2016-02-23 VITALS — BP 148/96 | HR 83 | Temp 98.1°F | Resp 14 | Ht 66.0 in | Wt 214.8 lb

## 2016-02-23 DIAGNOSIS — R591 Generalized enlarged lymph nodes: Secondary | ICD-10-CM | POA: Insufficient documentation

## 2016-02-23 DIAGNOSIS — E785 Hyperlipidemia, unspecified: Secondary | ICD-10-CM | POA: Insufficient documentation

## 2016-02-23 DIAGNOSIS — R599 Enlarged lymph nodes, unspecified: Secondary | ICD-10-CM

## 2016-02-23 DIAGNOSIS — E1165 Type 2 diabetes mellitus with hyperglycemia: Secondary | ICD-10-CM

## 2016-02-23 DIAGNOSIS — E1169 Type 2 diabetes mellitus with other specified complication: Secondary | ICD-10-CM

## 2016-02-23 DIAGNOSIS — IMO0001 Reserved for inherently not codable concepts without codable children: Secondary | ICD-10-CM

## 2016-02-23 DIAGNOSIS — I10 Essential (primary) hypertension: Secondary | ICD-10-CM | POA: Insufficient documentation

## 2016-02-23 DIAGNOSIS — E119 Type 2 diabetes mellitus without complications: Secondary | ICD-10-CM | POA: Insufficient documentation

## 2016-02-23 DIAGNOSIS — Z79899 Other long term (current) drug therapy: Secondary | ICD-10-CM | POA: Insufficient documentation

## 2016-02-23 LAB — CBC WITH DIFFERENTIAL/PLATELET
BASOS ABS: 0 {cells}/uL (ref 0–200)
BASOS PCT: 0 %
EOS ABS: 122 {cells}/uL (ref 15–500)
Eosinophils Relative: 1 %
HEMATOCRIT: 35.8 % (ref 35.0–45.0)
HEMOGLOBIN: 11.6 g/dL — AB (ref 11.7–15.5)
Lymphocytes Relative: 22 %
Lymphs Abs: 2684 cells/uL (ref 850–3900)
MCH: 25.4 pg — ABNORMAL LOW (ref 27.0–33.0)
MCHC: 32.4 g/dL (ref 32.0–36.0)
MCV: 78.3 fL — AB (ref 80.0–100.0)
MONO ABS: 610 {cells}/uL (ref 200–950)
MPV: 9.4 fL (ref 7.5–12.5)
Monocytes Relative: 5 %
NEUTROS ABS: 8784 {cells}/uL — AB (ref 1500–7800)
Neutrophils Relative %: 72 %
Platelets: 352 10*3/uL (ref 140–400)
RBC: 4.57 MIL/uL (ref 3.80–5.10)
RDW: 14.8 % (ref 11.0–15.0)
WBC: 12.2 10*3/uL — ABNORMAL HIGH (ref 3.8–10.8)

## 2016-02-23 LAB — HEMOGLOBIN A1C
Hgb A1c MFr Bld: 12.4 % — ABNORMAL HIGH (ref <5.7)
Mean Plasma Glucose: 309 mg/dL

## 2016-02-23 LAB — GLUCOSE, POCT (MANUAL RESULT ENTRY)
POC GLUCOSE: 322 mg/dL — AB (ref 70–99)
POC GLUCOSE: 295 mg/dL — AB (ref 70–99)

## 2016-02-23 MED ORDER — LISINOPRIL 40 MG PO TABS: 40.0000 mg | ORAL_TABLET | Freq: Every day | ORAL | Status: DC

## 2016-02-23 MED ORDER — ATORVASTATIN CALCIUM 40 MG PO TABS: 40.0000 mg | ORAL_TABLET | Freq: Every day | ORAL | Status: DC

## 2016-02-23 MED ORDER — INSULIN ASPART 100 UNIT/ML ~~LOC~~ SOLN
8.0000 [IU] | Freq: Once | SUBCUTANEOUS | Status: AC
  Administered 2016-02-23: 8 [IU] via SUBCUTANEOUS

## 2016-02-23 MED ORDER — GLUCOSE BLOOD VI STRP: ORAL_STRIP | Status: DC

## 2016-02-23 MED ORDER — TRUE METRIX METER DEVI: 1.0000 | Freq: Three times a day (TID) | Status: DC

## 2016-02-23 MED ORDER — GLIPIZIDE 10 MG PO TABS
5.0000 mg | ORAL_TABLET | Freq: Two times a day (BID) | ORAL | Status: DC
Start: 2016-02-23 — End: 2016-02-24

## 2016-02-23 MED ORDER — TRUEPLUS LANCETS 28G MISC: 1.0000 | Freq: Three times a day (TID) | Status: DC

## 2016-02-23 MED FILL — ATORVASTATIN 40 MG TABLET: 40 | 30 days supply | Qty: 30 | Fill #0

## 2016-02-23 MED FILL — TRUEplus LANCETS 28G MISC: 30 days supply | Qty: 100 | Fill #0

## 2016-02-23 MED FILL — TRUE METRIX TEST STRIP: 30 days supply | Qty: 100 | Fill #0

## 2016-02-23 MED FILL — LISINOPRIL 40 MG TABLET: 40 | 30 days supply | Qty: 30 | Fill #0

## 2016-02-23 MED FILL — !TRUE METRIX BLOOD GLUCOSE: 1 days supply | Qty: 1 | Fill #0

## 2016-02-23 MED FILL — glipiZIDE 5 MG TABS: 5 | 30 days supply | Qty: 60 | Fill #0

## 2016-02-23 NOTE — Progress Notes (Signed)
Pt here for medication refill and HFU for swollen lymphnodes. Pt denies any pain today. Pt needs refill on lancets, glipizide, lisinopril, and test strips. Pt has been out of all medications for about a month now but states she still has refills on atorvastin. Pt CBG is 322. Pt ate two boiled eggs this morning.

## 2016-02-23 NOTE — Progress Notes (Signed)
Subjective:  Patient ID: Sheila Bishop, female    DOB: 12-12-1970  Age: 45 y.o. MRN: [id number]  CC: Hospitalization Follow-up   HPI Sheila Bishop is a 45 year old female with a history of uncontrolled type 2 diabetes mellitus (A1c 7.8 from 01/2015), hyperlipidemia, hypertension recently seen in the ED for right neck swelling and CT soft tissue of the neck revealed mild lymphadenopathy throughout bilateral neck, though this may be reactive, lymphoproliferative disorder can have a similar appearance. She also had leukocytosis of 15.8 and was advised to see oncology.  Today she informs me the right neck mass she previously had has resolved and lasted about one week ;she does not feel any other masses in her head or neck and denies loss of weight, night sweats or fever.  She has run out of all her medications and needs refills.; CBG is 322. Last seen in the clinic a year ago.  Outpatient Prescriptions Prior to Visit  Medication Sig Dispense Refill  . HYDROcodone-acetaminophen (NORCO/VICODIN) 5-325 MG tablet Take 1-2 tablets by mouth every 6 (six) hours as needed for severe pain. 10 tablet 0  . ibuprofen (ADVIL,MOTRIN) 800 MG tablet Take 1 tablet (800 mg total) by mouth 3 (three) times daily. 21 tablet 0  . ACCU-CHEK FASTCLIX LANCETS MISC 1 each by Does not apply route 5 (five) times daily. 102 each 2  . atorvastatin (LIPITOR) 40 MG tablet Take 1 tablet (40 mg total) by mouth daily. 30 tablet 11  . Blood Glucose Monitoring Suppl (ACCU-CHEK NANO SMARTVIEW) W/DEVICE KIT 1 Device by Does not apply route as needed. 1 kit 0  . glipiZIDE (GLUCOTROL) 10 MG tablet Take 0.5 tablets (5 mg total) by mouth 2 (two) times daily before a meal. 90 tablet 3  . glucose blood (ACCU-CHEK SMARTVIEW) test strip 1 each by Other route 5 (five) times daily. 100 each 3  . lisinopril (PRINIVIL,ZESTRIL) 40 MG tablet Take 1 tablet (40 mg total) by mouth daily. Needs office visit for refills 30 tablet 0  .  terbinafine (LAMISIL) 250 MG tablet Take 1 tablet (250 mg total) by mouth daily. For 12 weeks (Patient not taking: Reported on 02/23/2016) 84 tablet 0   No facility-administered medications prior to visit.    ROS Review of Systems  Constitutional: Negative for activity change, appetite change and fatigue.  HENT: Negative for congestion, sinus pressure and sore throat.   Eyes: Negative for visual disturbance.  Respiratory: Negative for cough, chest tightness, shortness of breath and wheezing.   Cardiovascular: Negative for chest pain and palpitations.  Gastrointestinal: Negative for abdominal pain, constipation and abdominal distention.  Endocrine: Negative for polydipsia.  Genitourinary: Negative for dysuria and frequency.  Musculoskeletal: Negative for back pain and arthralgias.  Skin: Negative for rash.  Neurological: Negative for tremors, light-headedness and numbness.  Hematological: Does not bruise/bleed easily.  Psychiatric/Behavioral: Negative for behavioral problems and agitation.    Objective:  BP 148/96 mmHg  Pulse 83  Temp(Src) 98.1 F (36.7 C) (Oral)  Resp 14  Ht '5\' 6"'$  (1.676 m)  Wt 214 lb 12.8 oz (97.433 kg)  BMI 34.69 kg/m2  SpO2 99%  LMP 02/08/2016  BP/Weight 02/23/2016 02/09/2016 02/16/6159  Systolic BP 737 106 269  Diastolic BP 96 91 77  Wt. (Lbs) 214.8 219.06 218  BMI 34.69 35.37 35.2      Physical Exam  Constitutional: She is oriented to person, place, and time. She appears well-developed and well-nourished.  Cardiovascular: Normal rate, normal heart sounds and intact distal  pulses.   No murmur heard. Pulmonary/Chest: Effort normal and breath sounds normal. She has no wheezes. She has no rales. She exhibits no tenderness.  Abdominal: Soft. Bowel sounds are normal. She exhibits no distension and no mass. There is no tenderness.  Musculoskeletal: Normal range of motion.  Lymphadenopathy:    She has no cervical adenopathy.  Neurological: She is alert  and oriented to person, place, and time.     Lipid Panel     Component Value Date/Time   CHOL 199 06/16/2010 2054   TRIG 89 06/16/2010 2054   HDL 37* 06/16/2010 2054   CHOLHDL 5.4 Ratio 06/16/2010 2054   VLDL 18 06/16/2010 2054   LDLCALC 144* 06/16/2010 2054   LDLDIRECT 131* 04/21/2010 2016     CMP Latest Ref Rng 02/09/2016 11/05/2014 11/04/2014  Glucose 65 - 99 mg/dL 362(H) 126(H) 199(H)  BUN 6 - 20 mg/dL 7 <5(L) <5(L)  Creatinine 0.44 - 1.00 mg/dL 0.57 0.51 0.46(L)  Sodium 135 - 145 mmol/L 133(L) 137 137  Potassium 3.5 - 5.1 mmol/L 3.7 3.4(L) 3.9  Chloride 101 - 111 mmol/L 97(L) 103 104  CO2 22 - 32 mmol/L '24 24 26  '$ Calcium 8.9 - 10.3 mg/dL 9.3 8.9 8.6    Lab Results  Component Value Date   HGBA1C 7.80 02/10/2015    Assessment & Plan:   1. Dyslipidemia associated with type 2 diabetes mellitus ([place]) She is due for a lipid panel but is not fasting today. - POCT CBG monitoring - insulin aspart (novoLOG) injection 8 Units; Inject 0.08 mLs (8 Units total) into the skin once. - POCT CBG monitoring - atorvastatin (LIPITOR) 40 MG tablet; Take 1 tablet (40 mg total) by mouth daily.  Dispense: 30 tablet; Refill: 11  2. Uncontrolled type 2 diabetes mellitus without complication, without long-term current use of insulin (HCC) Last A1c was 7.8 Anticipated poor control with elevated A1c due to running out of medications for the last 1 month Will exercise caution when adjusting dose of medications due to above Complains emphasized - POCT CBG monitoring - glucose blood (TRUE METRIX BLOOD GLUCOSE TEST) test strip; Use as directed, 3 times daily before meals  Dispense: 100 each; Refill: 12 - Blood Glucose Monitoring Suppl (TRUE METRIX METER) DEVI; 1 each by Does not apply route 3 (three) times daily before meals.  Dispense: 1 Device; Refill: 0 - TRUEPLUS LANCETS 28G MISC; 1 each by Does not apply route 3 (three) times daily before meals.  Dispense: 100 each; Refill: 12 - glipiZIDE  (GLUCOTROL) 10 MG tablet; Take 0.5 tablets (5 mg total) by mouth 2 (two) times daily before a meal.  Dispense: 90 tablet; Refill: 3 - POCT CBG monitoring - COMPLETE METABOLIC PANEL WITH GFR; Future - Hemoglobin A1c; Future  3. Essential hypertension Slightly elevated due to running out of medications Low-sodium DASH diet - lisinopril (PRINIVIL,ZESTRIL) 40 MG tablet; Take 1 tablet (40 mg total) by mouth daily. Needs office visit for refills  Dispense: 30 tablet; Refill: 0  4. Lymphadenopathy of head and neck Lymphadenopathy could have been secondary to possible parotitis which she had 2 weeks ago. Reviewed previous CBCs and she tends to have a pattern of mild leukocytosis with WBC in the 12-17 range Will see back in 2 weeks and plan for repeat CT scan and referred to oncology if lymphadenopathy persist - CBC with Differential/Platelet; Future   Meds ordered this encounter  Medications  . insulin aspart (novoLOG) injection 8 Units    Sig:   .  glucose blood (TRUE METRIX BLOOD GLUCOSE TEST) test strip    Sig: Use as directed, 3 times daily before meals    Dispense:  100 each    Refill:  12  . Blood Glucose Monitoring Suppl (TRUE METRIX METER) DEVI    Sig: 1 each by Does not apply route 3 (three) times daily before meals.    Dispense:  1 Device    Refill:  0  . TRUEPLUS LANCETS 28G MISC    Sig: 1 each by Does not apply route 3 (three) times daily before meals.    Dispense:  100 each    Refill:  12  . lisinopril (PRINIVIL,ZESTRIL) 40 MG tablet    Sig: Take 1 tablet (40 mg total) by mouth daily. Needs office visit for refills    Dispense:  30 tablet    Refill:  0  . glipiZIDE (GLUCOTROL) 10 MG tablet    Sig: Take 0.5 tablets (5 mg total) by mouth 2 (two) times daily before a meal.    Dispense:  90 tablet    Refill:  3  . atorvastatin (LIPITOR) 40 MG tablet    Sig: Take 1 tablet (40 mg total) by mouth daily.    Dispense:  30 tablet    Refill:  11    Follow-up: Return in about 2  weeks (around 03/08/2016), or if symptoms worsen or fail to improve, for Follow-up on lymphadenopathy.   Arnoldo Morale MD

## 2016-02-23 NOTE — Patient Instructions (Signed)

## 2016-02-24 ENCOUNTER — Other Ambulatory Visit: Payer: Self-pay | Admitting: Family Medicine

## 2016-02-24 DIAGNOSIS — IMO0001 Reserved for inherently not codable concepts without codable children: Secondary | ICD-10-CM

## 2016-02-24 DIAGNOSIS — E1165 Type 2 diabetes mellitus with hyperglycemia: Principal | ICD-10-CM

## 2016-02-24 LAB — COMPLETE METABOLIC PANEL WITH GFR
ALBUMIN: 3.9 g/dL (ref 3.6–5.1)
ALT: 13 U/L (ref 6–29)
AST: 20 U/L (ref 10–30)
Alkaline Phosphatase: 122 U/L — ABNORMAL HIGH (ref 33–115)
BUN: 10 mg/dL (ref 7–25)
CALCIUM: 9.4 mg/dL (ref 8.6–10.2)
CHLORIDE: 103 mmol/L (ref 98–110)
CO2: 22 mmol/L (ref 20–31)
Creat: 0.62 mg/dL (ref 0.50–1.10)
GLUCOSE: 284 mg/dL — AB (ref 65–99)
Potassium: 3.7 mmol/L (ref 3.5–5.3)
Sodium: 137 mmol/L (ref 135–146)
TOTAL PROTEIN: 7.3 g/dL (ref 6.1–8.1)
Total Bilirubin: 0.4 mg/dL (ref 0.2–1.2)

## 2016-02-24 MED ORDER — GLIPIZIDE 10 MG PO TABS
10.0000 mg | ORAL_TABLET | Freq: Two times a day (BID) | ORAL | Status: DC
Start: 2016-02-24 — End: 2016-08-31

## 2016-02-24 NOTE — Telephone Encounter (Signed)
Called patient. Phone continued to ring with no answer nor a voicemail.

## 2016-02-24 NOTE — Telephone Encounter (Signed)
-----   Message from Arnoldo Morale, MD sent at 02/24/2016  8:25 AM EDT ----- A1c is elevated at 12.4 and I am aware she was out of her meds for one month ; I have increased Glipizide to 10mg  bid.

## 2016-02-25 ENCOUNTER — Telehealth: Payer: Self-pay | Admitting: Family Medicine

## 2016-02-25 NOTE — Telephone Encounter (Signed)
Called patient. Patient verified name and date of birth. Patient notified that her A1C is elevated at 12.4 so Dr.Amao has increased her glipizide to 10mg  twice daily. Patient notified that the prescription with the directions have been sent to the pharmacy. Patient voiced understanding.

## 2016-02-25 NOTE — Telephone Encounter (Signed)
-----   Message from Arnoldo Morale, MD sent at 02/24/2016  8:25 AM EDT ----- A1c is elevated at 12.4 and I am aware she was out of her meds for one month ; I have increased Glipizide to 10mg  bid.

## 2016-03-08 ENCOUNTER — Encounter: Payer: Self-pay | Admitting: Family Medicine

## 2016-03-08 ENCOUNTER — Ambulatory Visit: Payer: Self-pay | Attending: Family Medicine | Admitting: Family Medicine

## 2016-03-08 VITALS — BP 130/91 | HR 88 | Temp 97.9°F | Resp 12 | Ht 66.0 in | Wt 217.2 lb

## 2016-03-08 DIAGNOSIS — E785 Hyperlipidemia, unspecified: Secondary | ICD-10-CM

## 2016-03-08 DIAGNOSIS — E1165 Type 2 diabetes mellitus with hyperglycemia: Secondary | ICD-10-CM

## 2016-03-08 DIAGNOSIS — I10 Essential (primary) hypertension: Secondary | ICD-10-CM

## 2016-03-08 DIAGNOSIS — R590 Localized enlarged lymph nodes: Secondary | ICD-10-CM

## 2016-03-08 DIAGNOSIS — R599 Enlarged lymph nodes, unspecified: Secondary | ICD-10-CM

## 2016-03-08 DIAGNOSIS — E1169 Type 2 diabetes mellitus with other specified complication: Secondary | ICD-10-CM

## 2016-03-08 DIAGNOSIS — IMO0001 Reserved for inherently not codable concepts without codable children: Secondary | ICD-10-CM

## 2016-03-08 LAB — GLUCOSE, POCT (MANUAL RESULT ENTRY): POC Glucose: 185 mg/dl — AB (ref 70–99)

## 2016-03-08 MED FILL — glipiZIDE 10 MG TABS: 10 | 30 days supply | Qty: 60 | Fill #0

## 2016-03-08 NOTE — Progress Notes (Signed)
Subjective:    Patient ID: Sheila Bishop, female    DOB: 30-Jun-1971, 45 y.o.   MRN: [id number]  HPI 45 year old female with a history of type 2 diabetes mellitus (A1c of 12.4 from 01/2016), hypertension, hyperlipidemia who comes in today for follow-up visit.  She is here to follow-up on findings of lymphadenopathy on last CT of the head and neck which was performed at the ED for which a recommendation for referral to oncology was made. She does not feel any lumps or masses in her neck and has no upper respiratory symptoms or sinus symptoms; my impression was that lymphadenopathy was probably secondary to proctitis which she had presented with at that time. She denies any fevers.  Her blood sugars are still elevated with fasting sugars in the 180s to 200 range and she remains on glipizide. She had been out of her glipizide prior to her visit last month. Compliant with her antihypertensive and statin. She has no complaints today. Past Medical History  Diagnosis Date  . Breast abscess Nov 2012    left  . Headache(784.0)   . Wears glasses   . Obesity   . Depression 2007  . Diabetes mellitus 2009  . Hypertension 2009    Past Surgical History  Procedure Laterality Date  . Breast biopsy  08/07/2011    Procedure: BREAST BIOPSY;  Surgeon: Joyice Faster. Cornett, MD;  Location: [facility];  Service: General;  Laterality: Left;  BIOPSY BREAST LEFT SIDE  . Cesarean section  2002    Social History   Social History  . Marital Status: Legally Separated    Spouse Name: N/A  . Number of Children: N/A  . Years of Education: N/A   Occupational History  . Not on file.   Social History Main Topics  . Smoking status: Former Smoker    Quit date: 12/31/2007  . Smokeless tobacco: Not on file  . Alcohol Use: No  . Drug Use: No  . Sexual Activity: Not on file   Other Topics Concern  . Not on file   Social History Narrative    Current Outpatient Prescriptions on File Prior to Visit    Medication Sig Dispense Refill  . atorvastatin (LIPITOR) 40 MG tablet Take 1 tablet (40 mg total) by mouth daily. 30 tablet 11  . Blood Glucose Monitoring Suppl (TRUE METRIX METER) DEVI 1 each by Does not apply route 3 (three) times daily before meals. 1 Device 0  . glipiZIDE (GLUCOTROL) 10 MG tablet Take 1 tablet (10 mg total) by mouth 2 (two) times daily before a meal. 60 tablet 3  . glucose blood (TRUE METRIX BLOOD GLUCOSE TEST) test strip Use as directed, 3 times daily before meals 100 each 12  . HYDROcodone-acetaminophen (NORCO/VICODIN) 5-325 MG tablet Take 1-2 tablets by mouth every 6 (six) hours as needed for severe pain. 10 tablet 0  . ibuprofen (ADVIL,MOTRIN) 800 MG tablet Take 1 tablet (800 mg total) by mouth 3 (three) times daily. 21 tablet 0  . lisinopril (PRINIVIL,ZESTRIL) 40 MG tablet Take 1 tablet (40 mg total) by mouth daily. Needs office visit for refills 30 tablet 0  . terbinafine (LAMISIL) 250 MG tablet Take 1 tablet (250 mg total) by mouth daily. For 12 weeks 84 tablet 0  . TRUEPLUS LANCETS 28G MISC 1 each by Does not apply route 3 (three) times daily before meals. 100 each 12   No current facility-administered medications on file prior to visit.     Review of  Systems  Constitutional: Negative for activity change and appetite change.  HENT: Negative for sinus pressure and sore throat.   Respiratory: Negative for chest tightness, shortness of breath and wheezing.   Cardiovascular: Negative for chest pain and palpitations.  Gastrointestinal: Negative for abdominal pain, constipation and abdominal distention.  Genitourinary: Negative.   Musculoskeletal: Negative.   Psychiatric/Behavioral: Negative for behavioral problems and dysphoric mood.       Objective: Filed Vitals:   03/08/16 0955  BP: 130/91  Pulse: 88  Temp: 97.9 F (36.6 C)  TempSrc: Oral  Resp: 12  Height: 5\' 6"  (1.676 m)  Weight: 217 lb 3.2 oz (98.521 kg)  SpO2: 98%      Physical Exam   Constitutional: She is oriented to person, place, and time. She appears well-developed and well-nourished.  Cardiovascular: Normal rate, normal heart sounds and intact distal pulses.   No murmur heard. Pulmonary/Chest: Effort normal and breath sounds normal. She has no wheezes. She has no rales. She exhibits no tenderness.  Abdominal: Soft. Bowel sounds are normal. She exhibits no distension and no mass. There is no tenderness.  Musculoskeletal: Normal range of motion.  Neurological: She is alert and oriented to person, place, and time.    CLINICAL DATA: RIGHT neck pain and swelling beginning yesterday. History of hypertension and diabetes.  EXAM: CT NECK WITH CONTRAST  TECHNIQUE: Multidetector CT imaging of the neck was performed using the standard protocol following the bolus administration of intravenous contrast.  CONTRAST: 75 cc ISOVUE-300 IOPAMIDOL (ISOVUE-300) INJECTION 61%  COMPARISON: None.  FINDINGS: Pharynx and larynx: Normal.  Salivary glands: Normal.  Thyroid: Normal.  Lymph nodes: Scattered prominent though not pathologically enlarged lymph nodes throughout bilateral neck chains. However, somewhat matted appearing nodal conglomeration LEFT level 5 B measuring up to 13 x 12 mm.  Vascular: Mild intimal thickening and early calcific atherosclerosis RIGHT carotid bulb.  Limited intracranial: Normal. Possible empty sella partially imaged.  Mastoids and visualized paranasal sinuses: Small LEFT maxillary mucosal retention cysts without paranasal sinus air-fluid levels. Imaged mastoid air cells are well aerated.  Skeleton: Scattered dental caries. 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 Subcentimeter probable bone island LEFT mandible ramus versus accessory unerupted tooth.  Upper chest: Lung apices are clear. No superior mediastinal lymphadenopathy.  IMPRESSION: Mild lymphadenopathy throughout  bilateral neck: Though these may be reactive, lymphoproliferative disorder can have a similar appearance.   Electronically Signed  By: Elon Alas M.D.  On: 02/09/2016 22:09      Assessment & Plan:  1. Dyslipidemia associated with type 2 diabetes mellitus ([place]) She is due for a lipid panel but is not fasting today. We'll order lipid panel at next office visit  2. Uncontrolled type 2 diabetes mellitus without complication, without long-term current use of insulin (HCC) Last A1c was 12.4 Poor control could be attributed to running out of medications for the last 1 month Unable to tolerate metformin due to GI side effects Would love to place on Invokana and this has to be ordered through the patient assistance program and she is yet to obtain the [facility] card. Will review blood sugar log at her next office visit and discuss additional management if needed  3. Essential hypertension Controlled  4. Lymphadenopathy of head and neck No clinical evidence of lymphadenopathy Lymphadenopathy on imaging could have been secondary to possible parotitis which she had previously. Repeat CT scan and refer to oncology if lymphadenopathy persist - CBC with Differential/Platelet; Future

## 2016-03-08 NOTE — Patient Instructions (Signed)
Diabetes Mellitus and Food It is important for you to manage your blood sugar (glucose) level. Your blood glucose level can be greatly affected by what you eat. Eating healthier foods in the appropriate amounts throughout the day at about the same time each day will help you control your blood glucose level. It can also help slow or prevent worsening of your diabetes mellitus. Healthy eating may even help you improve the level of your blood pressure and reach or maintain a healthy weight.  General recommendations for healthful eating and cooking habits include:  Eating meals and snacks regularly. Avoid going long periods of time without eating to lose weight.  Eating a diet that consists mainly of plant-based foods, such as fruits, vegetables, nuts, legumes, and whole grains.  Using low-heat cooking methods, such as baking, instead of high-heat cooking methods, such as deep frying. Work with your dietitian to make sure you understand how to use the Nutrition Facts information on food labels. HOW CAN FOOD AFFECT ME? Carbohydrates Carbohydrates affect your blood glucose level more than any other type of food. Your dietitian will help you determine how many carbohydrates to eat at each meal and teach you how to count carbohydrates. Counting carbohydrates is important to keep your blood glucose at a healthy level, especially if you are using insulin or taking certain medicines for diabetes mellitus. Alcohol Alcohol can cause sudden decreases in blood glucose (hypoglycemia), especially if you use insulin or take certain medicines for diabetes mellitus. Hypoglycemia can be a life-threatening condition. Symptoms of hypoglycemia (sleepiness, dizziness, and disorientation) are similar to symptoms of having too much alcohol.  If your health care provider has given you approval to drink alcohol, do so in moderation and use the following guidelines:  Women should not have more than one drink per day, and men  should not have more than two drinks per day. One drink is equal to:  12 oz of beer.  5 oz of wine.  1 oz of hard liquor.  Do not drink on an empty stomach.  Keep yourself hydrated. Have water, diet soda, or unsweetened iced tea.  Regular soda, juice, and other mixers might contain a lot of carbohydrates and should be counted. WHAT FOODS ARE NOT RECOMMENDED? As you make food choices, it is important to remember that all foods are not the same. Some foods have fewer nutrients per serving than other foods, even though they might have the same number of calories or carbohydrates. It is difficult to get your body what it needs when you eat foods with fewer nutrients. Examples of foods that you should avoid that are high in calories and carbohydrates but low in nutrients include:  Trans fats (most processed foods list trans fats on the Nutrition Facts label).  Regular soda.  Juice.  Candy.  Sweets, such as cake, pie, doughnuts, and cookies.  Fried foods. WHAT FOODS CAN I EAT? Eat nutrient-rich foods, which will nourish your body and keep you healthy. The food you should eat also will depend on several factors, including:  The calories you need.  The medicines you take.  Your weight.  Your blood glucose level.  Your blood pressure level.  Your cholesterol level. You should eat a variety of foods, including:  Protein.  Lean cuts of meat.  Proteins low in saturated fats, such as fish, egg whites, and beans. Avoid processed meats.  Fruits and vegetables.  Fruits and vegetables that may help control blood glucose levels, such as apples, mangoes, and   yams.  Dairy products.  Choose fat-free or low-fat dairy products, such as milk, yogurt, and cheese.  Grains, bread, pasta, and rice.  Choose whole grain products, such as multigrain bread, whole oats, and brown rice. These foods may help control blood pressure.  Fats.  Foods containing healthful fats, such as nuts,  avocado, olive oil, canola oil, and fish. DOES EVERYONE WITH DIABETES MELLITUS HAVE THE SAME MEAL PLAN? Because every person with diabetes mellitus is different, there is not one meal plan that works for everyone. It is very important that you meet with a dietitian who will help you create a meal plan that is just right for you.   This information is not intended to replace advice given to you by your health care provider. Make sure you discuss any questions you have with your health care provider.   Document Released: 06/15/2005 Document Revised: 10/09/2014 Document Reviewed: 08/15/2013 Elsevier Interactive Patient Education 2016 [employer]  

## 2016-03-08 NOTE — Progress Notes (Signed)
Pt here for F/U for lymphadenopathy. Pt denies any pain today. Pt CBG is 185. Pt has taken her medications today. Pt has eaten this morning. Pt needs a refill on lisinopril.

## 2016-03-17 ENCOUNTER — Ambulatory Visit (HOSPITAL_COMMUNITY): Payer: MEDICAID | Attending: Family Medicine

## 2016-03-22 ENCOUNTER — Other Ambulatory Visit: Payer: Self-pay | Admitting: Family Medicine

## 2016-04-12 MED FILL — LISINOPRIL 40 MG TABLET: 40 | 30 days supply | Qty: 30 | Fill #0

## 2016-04-12 MED FILL — ATORVASTATIN 40 MG TABLET: 40 | 30 days supply | Qty: 30 | Fill #1

## 2016-04-12 MED FILL — glipiZIDE 10 MG TABS: 10 | 30 days supply | Qty: 60 | Fill #1

## 2016-06-21 MED FILL — glipiZIDE 10 MG TABS: 10 | 30 days supply | Qty: 60 | Fill #2

## 2016-06-21 MED FILL — LISINOPRIL 40 MG TABLET: 40 | 30 days supply | Qty: 30 | Fill #1

## 2016-06-21 MED FILL — ATORVASTATIN 40 MG TABLET: 40 | 30 days supply | Qty: 30 | Fill #2

## 2016-07-05 ENCOUNTER — Ambulatory Visit: Payer: Self-pay | Admitting: Family Medicine

## 2016-07-24 MED FILL — ATORVASTATIN 40 MG TABLET: 40 | 30 days supply | Qty: 30 | Fill #3

## 2016-07-24 MED FILL — glipiZIDE 10 MG TABS: 10 | 30 days supply | Qty: 60 | Fill #3

## 2016-07-24 MED FILL — LISINOPRIL 40 MG TABLET: 40 | 30 days supply | Qty: 30 | Fill #2

## 2016-08-31 ENCOUNTER — Other Ambulatory Visit: Payer: Self-pay | Admitting: Family Medicine

## 2016-08-31 DIAGNOSIS — IMO0001 Reserved for inherently not codable concepts without codable children: Secondary | ICD-10-CM

## 2016-08-31 DIAGNOSIS — E1165 Type 2 diabetes mellitus with hyperglycemia: Principal | ICD-10-CM

## 2016-08-31 MED FILL — LISINOPRIL 40 MG TABLET: 40 | 30 days supply | Qty: 30 | Fill #3

## 2016-08-31 MED FILL — ATORVASTATIN 40 MG TABLET: 40 | 30 days supply | Qty: 30 | Fill #4

## 2016-08-31 MED FILL — glipiZIDE 10 MG TABS: 10 | 30 days supply | Qty: 60 | Fill #0

## 2016-10-12 ENCOUNTER — Emergency Department (HOSPITAL_COMMUNITY)
Admission: EM | Admit: 2016-10-12 | Discharge: 2016-10-13 | Disposition: A | Discharge status: Home / Self Care | Payer: Self-pay | Attending: Emergency Medicine | Admitting: Emergency Medicine

## 2016-10-12 ENCOUNTER — Emergency Department (HOSPITAL_COMMUNITY): Payer: Self-pay

## 2016-10-12 ENCOUNTER — Encounter (HOSPITAL_COMMUNITY): Payer: Self-pay

## 2016-10-12 DIAGNOSIS — R0789 Other chest pain: Secondary | ICD-10-CM | POA: Insufficient documentation

## 2016-10-12 DIAGNOSIS — Z7984 Long term (current) use of oral hypoglycemic drugs: Secondary | ICD-10-CM | POA: Insufficient documentation

## 2016-10-12 DIAGNOSIS — R200 Anesthesia of skin: Secondary | ICD-10-CM | POA: Insufficient documentation

## 2016-10-12 DIAGNOSIS — R079 Chest pain, unspecified: Secondary | ICD-10-CM

## 2016-10-12 DIAGNOSIS — Z87891 Personal history of nicotine dependence: Secondary | ICD-10-CM | POA: Insufficient documentation

## 2016-10-12 DIAGNOSIS — I10 Essential (primary) hypertension: Secondary | ICD-10-CM | POA: Insufficient documentation

## 2016-10-12 DIAGNOSIS — R202 Paresthesia of skin: Secondary | ICD-10-CM

## 2016-10-12 DIAGNOSIS — E119 Type 2 diabetes mellitus without complications: Secondary | ICD-10-CM | POA: Insufficient documentation

## 2016-10-12 LAB — BASIC METABOLIC PANEL WITH GFR
Anion gap: 9 (ref 5–15)
BUN: 10 mg/dL (ref 6–20)
CO2: 24 mmol/L (ref 22–32)
Calcium: 9.4 mg/dL (ref 8.9–10.3)
Chloride: 106 mmol/L (ref 101–111)
Creatinine, Ser: 0.58 mg/dL (ref 0.44–1.00)
GFR calc Af Amer: >60 mL/min
GFR calc non Af Amer: >60 mL/min
Glucose, Bld: 124 mg/dL — ABNORMAL HIGH (ref 65–99)
Potassium: 3.2 mmol/L — ABNORMAL LOW (ref 3.5–5.1)
Sodium: 139 mmol/L (ref 135–145)

## 2016-10-12 LAB — I-STAT TROPONIN, ED: TROPONIN I, POC: 0 ng/mL (ref 0.00–0.08)

## 2016-10-12 LAB — CBC
HCT: 30.5 % — ABNORMAL LOW (ref 36.0–46.0)
Hemoglobin: 10.2 g/dL — ABNORMAL LOW (ref 12.0–15.0)
MCH: 25.7 pg — ABNORMAL LOW (ref 26.0–34.0)
MCHC: 33.4 g/dL (ref 30.0–36.0)
MCV: 76.8 fL — AB (ref 78.0–100.0)
PLATELETS: 302 10*3/uL (ref 150–400)
RBC: 3.97 MIL/uL (ref 3.87–5.11)
RDW: 13.4 % (ref 11.5–15.5)
WBC: 13.1 10*3/uL — AB (ref 4.0–10.5)

## 2016-10-12 MED ORDER — POTASSIUM CHLORIDE CRYS ER 20 MEQ PO TBCR
20.0000 meq | EXTENDED_RELEASE_TABLET | Freq: Once | ORAL | Status: AC
  Administered 2016-10-12: 20 meq via ORAL
  Filled 2016-10-12: qty 1

## 2016-10-12 NOTE — ED Provider Notes (Signed)
[facility] DEPT Provider Note   CSN: [id number] Arrival date & time: 10/12/16  1522     History   Chief Complaint Chief Complaint  Patient presents with  . Chest Pain    HPI Sheila Bishop is a 46 y.o. female.  This is a 46 year old female with a history of hypertension, hyperlipidemia and diabetes who was at work when she noticed some numbness and tingling to her left arm that has been persistent throughout the day.  She also developed some chest discomfort that lasted approximately 5 minutes without nausea or shortness of breath. Patient states she is compliant with her medications.  Her blood sugar normally runs in the high 100s and low 200s.  Denies any recent illnesses      Past Medical History:  Diagnosis Date  . Breast abscess Nov 2012   left  . Depression 2007  . Diabetes mellitus 2009  . Headache(784.0)   . Hypertension 2009  . Obesity   . Wears glasses     Patient Active Problem List   Diagnosis Date Noted  . Lymphadenopathy of head and neck region 03/08/2016  . Irregular menses 02/10/2015  . Dyslipidemia associated with type 2 diabetes mellitus ([place]) 02/10/2015  . Diabetes type 2, uncontrolled ([place]) 11/26/2014  . Pain due to onychomycosis of toenail 11/26/2014  . Hypertension 11/01/2014    Past Surgical History:  Procedure Laterality Date  . BREAST BIOPSY  08/07/2011   Procedure: BREAST BIOPSY;  Surgeon: Joyice Faster. Cornett, MD;  Location: [facility];  Service: General;  Laterality: Left;  BIOPSY BREAST LEFT SIDE  . CESAREAN SECTION  2002    OB History    Gravida Para Term Preterm AB Living   4 3 3  0 1 4   SAB TAB Ectopic Multiple Live Births   1     1         Home Medications    Prior to Admission medications   Medication Sig Start Date End Date Taking? Authorizing Provider  atorvastatin (LIPITOR) 40 MG tablet Take 1 tablet (40 mg total) by mouth daily. 02/23/16  Yes Arnoldo Morale, MD  glipiZIDE (GLUCOTROL) 10 MG tablet TAKE 1 TABLET BY  MOUTH 2 TIMES DAILY BEFORE A MEAL. 08/31/16  Yes Arnoldo Morale, MD  lisinopril (PRINIVIL,ZESTRIL) 40 MG tablet TAKE 1 TABLET BY MOUTH DAILY. 03/22/16  Yes Arnoldo Morale, MD  Blood Glucose Monitoring Suppl (TRUE METRIX METER) DEVI 1 each by Does not apply route 3 (three) times daily before meals. 02/23/16   Arnoldo Morale, MD  glucose blood (TRUE METRIX BLOOD GLUCOSE TEST) test strip Use as directed, 3 times daily before meals 02/23/16   Arnoldo Morale, MD  HYDROcodone-acetaminophen (NORCO/VICODIN) 5-325 MG tablet Take 1-2 tablets by mouth every 6 (six) hours as needed for severe pain. Patient not taking: Reported on 10/12/2016 02/09/16   Olivia Canter Sam, PA-C  ibuprofen (ADVIL,MOTRIN) 800 MG tablet Take 1 tablet (800 mg total) by mouth 3 (three) times daily. Patient not taking: Reported on 10/12/2016 02/09/16   Olivia Canter Sam, PA-C  terbinafine (LAMISIL) 250 MG tablet Take 1 tablet (250 mg total) by mouth daily. For 12 weeks Patient not taking: Reported on 10/12/2016 11/26/14   Boykin Nearing, MD  TRUEPLUS LANCETS 28G MISC 1 each by Does not apply route 3 (three) times daily before meals. 02/23/16   Arnoldo Morale, MD    Family History Family History  Problem Relation Age of Onset  . Hyperlipidemia Mother   . Hypertension Mother   .  Heart disease Mother     Social History Social History  Substance Use Topics  . Smoking status: Former Smoker    Quit date: 12/31/2007  . Smokeless tobacco: Never Used  . Alcohol use No     Allergies   Patient has no known allergies.   Review of Systems Review of Systems  Constitutional: Negative for fever.  HENT: Negative.   Respiratory: Negative for shortness of breath.   Cardiovascular: Positive for chest pain.  Gastrointestinal: Negative for abdominal pain and nausea.  Genitourinary: Negative for dysuria.  Skin: Negative for rash and wound.  Neurological: Positive for numbness. Negative for weakness.  All other systems reviewed and are negative.    Physical  Exam Updated Vital Signs BP 137/97 (BP Location: Right Arm)   Pulse 86   Temp 98 F (36.7 C)   Resp 16   Ht 5\' 6"  (1.676 m)   Wt 102.1 kg   LMP 10/02/2016   SpO2 100%   BMI 36.32 kg/m   Physical Exam  Constitutional: She is oriented to person, place, and time. She appears well-developed and well-nourished. No distress.  HENT:  Head: Normocephalic and atraumatic.  No facial asymmetry  Eyes: Pupils are equal, round, and reactive to light.  Neck: Normal range of motion. Neck supple.  Cardiovascular: Normal rate and regular rhythm.   Pulmonary/Chest: Effort normal and breath sounds normal.  Abdominal: Soft. Bowel sounds are normal.  Musculoskeletal: Normal range of motion.  Neurological: She is alert and oriented to person, place, and time. She has normal strength. No cranial nerve deficit or sensory deficit.  Skin: Skin is warm and dry.  Psychiatric: She has a normal mood and affect.  Nursing note and vitals reviewed.    ED Treatments / Results  Labs (all labs ordered are listed, but only abnormal results are displayed) Labs Reviewed  BASIC METABOLIC PANEL - Abnormal; Notable for the following:       Result Value   Potassium 3.2 (*)    Glucose, Bld 124 (*)    All other components within normal limits  CBC - Abnormal; Notable for the following:    WBC 13.1 (*)    Hemoglobin 10.2 (*)    HCT 30.5 (*)    MCV 76.8 (*)    MCH 25.7 (*)    All other components within normal limits  I-STAT TROPOININ, ED  I-STAT TROPOININ, ED    EKG  EKG Interpretation  Date/Time:  Thursday October 12 2016 15:27:52 EST Ventricular Rate:  95 PR Interval:  148 QRS Duration: 84 QT Interval:  356 QTC Calculation: 447 R Axis:   64 Text Interpretation:  Normal sinus rhythm Normal ECG No significant change since last tracing Confirmed by [facility] MD, PEDRO ([id number]) on 10/13/2016 12:17:14 AM       Radiology Dg Chest 2 View  Result Date: 10/12/2016 CLINICAL DATA:  Mid chest pain, left arm  numbness today, some nausea, former smoking history EXAM: CHEST  2 VIEW COMPARISON:  Portable chest x-ray of 10/31/2014 FINDINGS: No active infiltrate or effusion is seen. Mediastinal and hilar contours are unremarkable. The heart is within normal limits in size. No bony abnormality is seen. IMPRESSION: No active cardiopulmonary disease. Electronically Signed   By: Ivar Drape M.D.   On: 10/12/2016 16:06   Dg Cervical Spine Complete  Result Date: 10/12/2016 CLINICAL DATA:  Acute onset of generalized chest pain, radiating down the left arm, with tingling. Initial encounter. EXAM: CERVICAL SPINE - COMPLETE 4+ VIEW COMPARISON:  None. FINDINGS: There is no evidence of fracture or subluxation. Vertebral bodies demonstrate normal height and alignment. Intervertebral disc spaces are preserved. Prevertebral soft tissues are within normal limits. The provided odontoid view demonstrates no significant abnormality. The visualized lung apices are clear. IMPRESSION: No evidence of fracture or subluxation along the cervical spine. Electronically Signed   By: Garald Balding M.D.   On: 10/12/2016 23:02    Procedures Procedures (including critical care time)  Medications Ordered in ED Medications  potassium chloride SA (K-DUR,KLOR-CON) CR tablet 20 mEq (20 mEq Oral Given 10/12/16 2227)     Initial Impression / Assessment and Plan / ED Course  I have reviewed the triage vital signs and the nursing notes.  Pertinent labs & imaging results that were available during my care of the patient were reviewed by me and considered in my medical decision making (see chart for details).  Clinical Course   Initial troponin is negative will repeat for delta troponin.  Repeat EKG patient's potassium is 3.2.  This has been supplemented in the emergency department.  On examination, she still is having tingling and numbness to her left arm no longer having any chest discomfort, will obtain cervical spine to rule out  radiculopathy Heart Score 2  Issue is low risk for cardiac and instructed to follow-up with her primary care physician to return immediately if she develops any chest pain associated with nausea, shortness of breath or diaphoresis    Final Clinical Impressions(s) / ED Diagnoses   Final diagnoses:  Nonspecific chest pain  Numbness and tingling in left arm    New Prescriptions New Prescriptions   No medications on file     Junius Creamer, NP 10/12/16 2232    Junius Creamer, NP 10/13/16 0021    Fatima Blank, MD 10/17/16 0004

## 2016-10-12 NOTE — ED Triage Notes (Signed)
Per Pt, Pt is coming from work with complaints of chest pain that has subsided, but now has continued to radiate down the left arm with some tingling. Pt has HX of High cholesterol, HTN, Diabetes.

## 2016-10-12 NOTE — ED Notes (Signed)
Pt transported to xray 

## 2016-10-13 LAB — I-STAT TROPONIN, ED: TROPONIN I, POC: 0 ng/mL (ref 0.00–0.08)

## 2016-10-13 NOTE — Discharge Instructions (Signed)
Your evaluated for chest pain and numbness and tingling in your left arm 2 sets of cardiac markers are negative.  EKG is unchanged.  Remainder of your lab work is all normal.  He did look at her neck to rule out any pathology that would cause the numbness and tingling into your arm.  This was negative as well. Please make an appointment with your primary care physician for follow-up anytime you develop new or worsening chest pain associated with nausea, sweating or shortness of breath.  Please return immediately for further evaluation.

## 2016-10-20 ENCOUNTER — Ambulatory Visit: Payer: Self-pay | Admitting: Family Medicine

## 2016-11-01 ENCOUNTER — Ambulatory Visit: Payer: Self-pay | Attending: Family Medicine | Admitting: Family Medicine

## 2016-11-01 ENCOUNTER — Encounter: Payer: Self-pay | Admitting: Licensed Clinical Social Worker

## 2016-11-01 ENCOUNTER — Encounter: Payer: Self-pay | Admitting: Family Medicine

## 2016-11-01 VITALS — BP 157/78 | HR 104 | Temp 98.3°F | Ht 66.0 in | Wt 212.8 lb

## 2016-11-01 DIAGNOSIS — I10 Essential (primary) hypertension: Secondary | ICD-10-CM | POA: Insufficient documentation

## 2016-11-01 DIAGNOSIS — Z9114 Patient's other noncompliance with medication regimen: Secondary | ICD-10-CM | POA: Insufficient documentation

## 2016-11-01 DIAGNOSIS — Z9119 Patient's noncompliance with other medical treatment and regimen: Secondary | ICD-10-CM | POA: Insufficient documentation

## 2016-11-01 DIAGNOSIS — E785 Hyperlipidemia, unspecified: Secondary | ICD-10-CM | POA: Insufficient documentation

## 2016-11-01 DIAGNOSIS — IMO0001 Reserved for inherently not codable concepts without codable children: Secondary | ICD-10-CM

## 2016-11-01 DIAGNOSIS — E1165 Type 2 diabetes mellitus with hyperglycemia: Secondary | ICD-10-CM | POA: Insufficient documentation

## 2016-11-01 DIAGNOSIS — R51 Headache: Secondary | ICD-10-CM | POA: Insufficient documentation

## 2016-11-01 DIAGNOSIS — E1169 Type 2 diabetes mellitus with other specified complication: Secondary | ICD-10-CM

## 2016-11-01 DIAGNOSIS — F329 Major depressive disorder, single episode, unspecified: Secondary | ICD-10-CM | POA: Insufficient documentation

## 2016-11-01 DIAGNOSIS — Z7984 Long term (current) use of oral hypoglycemic drugs: Secondary | ICD-10-CM | POA: Insufficient documentation

## 2016-11-01 DIAGNOSIS — Z91148 Patient's other noncompliance with medication regimen for other reason: Secondary | ICD-10-CM | POA: Insufficient documentation

## 2016-11-01 DIAGNOSIS — E669 Obesity, unspecified: Secondary | ICD-10-CM | POA: Insufficient documentation

## 2016-11-01 LAB — LIPID PANEL W/REFLEX DIRECT LDL
CHOL/HDL RATIO: 3.9 ratio (ref <5.0)
Cholesterol: 153 mg/dL (ref <200)
HDL: 39 mg/dL — ABNORMAL LOW (ref >50)
LDL-Cholesterol: 96 mg/dL
NON-HDL CHOLESTEROL (CALC): 114 mg/dL (ref <130)
Triglycerides: 90 mg/dL (ref <150)

## 2016-11-01 LAB — COMPLETE METABOLIC PANEL WITH GFR
ALBUMIN: 4 g/dL (ref 3.6–5.1)
ALT: 13 U/L (ref 6–29)
AST: 13 U/L (ref 10–30)
Alkaline Phosphatase: 103 U/L (ref 33–115)
BUN: 14 mg/dL (ref 7–25)
CALCIUM: 9.4 mg/dL (ref 8.6–10.2)
CHLORIDE: 100 mmol/L (ref 98–110)
CO2: 23 mmol/L (ref 20–31)
CREATININE: 0.63 mg/dL (ref 0.50–1.10)
GFR, Est African American: >89 mL/min (ref >=60)
GFR, Est Non African American: >89 mL/min (ref >=60)
GLUCOSE: 225 mg/dL — AB (ref 65–99)
POTASSIUM: 3.7 mmol/L (ref 3.5–5.3)
SODIUM: 136 mmol/L (ref 135–146)
Total Bilirubin: 0.6 mg/dL (ref 0.2–1.2)
Total Protein: 7.7 g/dL (ref 6.1–8.1)

## 2016-11-01 LAB — POCT GLYCOSYLATED HEMOGLOBIN (HGB A1C): Hemoglobin A1C: 10.1

## 2016-11-01 LAB — POCT CBG (FASTING - GLUCOSE)-MANUAL ENTRY: GLUCOSE FASTING, POC: 240 mg/dL — AB (ref 70–99)

## 2016-11-01 MED ORDER — GLIPIZIDE 10 MG PO TABS: ORAL_TABLET | ORAL | 3 refills | Status: DC

## 2016-11-01 MED ORDER — ATORVASTATIN CALCIUM 40 MG PO TABS: 40.0000 mg | ORAL_TABLET | Freq: Every day | ORAL | 3 refills | Status: DC

## 2016-11-01 MED ORDER — LISINOPRIL 40 MG PO TABS: 40.0000 mg | ORAL_TABLET | Freq: Every day | ORAL | 3 refills | Status: DC

## 2016-11-01 MED FILL — LISINOPRIL 40 MG TABLET: 40 | 30 days supply | Qty: 30 | Fill #0

## 2016-11-01 MED FILL — ATORVASTATIN 40 MG TABLET: 40 | 30 days supply | Qty: 30 | Fill #0

## 2016-11-01 MED FILL — glipiZIDE 10 MG TABS: 10 | 30 days supply | Qty: 60 | Fill #0

## 2016-11-01 NOTE — Progress Notes (Signed)
Subjective:  Patient ID: DEMIRA Bishop, female    DOB: Sep 17, 1971  Age: 46 y.o. MRN: [id number]  CC: Diabetes; congested; Hypertension; Hyperlipidemia; Headache; and Chest Pain (/ numbness and tingling in left arm)   HPI Sheila Bishop is a 46 year old female with a history of type 2 diabetes mellitus (A1c 10.1), hypertension, hyperlipidemia, noncompliance with medical therapy who presents today for follow-up visit. She endorses being out of her medications and has been unable to keep her follow-up appointments due to ongoing family stressors.  She was seen at the ED 2 weeks ago for chest pains and numbness and tingling in her left arm and she was told to have low risk for cardiac condition. She denies chest pains at this time.  Past Medical History:  Diagnosis Date  . Breast abscess Nov 2012   left  . Depression 2007  . Diabetes mellitus 2009  . Headache(784.0)   . Hypertension 2009  . Obesity   . Wears glasses     Past Surgical History:  Procedure Laterality Date  . BREAST BIOPSY  08/07/2011   Procedure: BREAST BIOPSY;  Surgeon: Joyice Faster. Cornett, MD;  Location: [facility];  Service: General;  Laterality: Left;  BIOPSY BREAST LEFT SIDE  . CESAREAN SECTION  2002     No Known Allergies   Outpatient Medications Prior to Visit  Medication Sig Dispense Refill  . Blood Glucose Monitoring Suppl (TRUE METRIX METER) DEVI 1 each by Does not apply route 3 (three) times daily before meals. 1 Device 0  . glucose blood (TRUE METRIX BLOOD GLUCOSE TEST) test strip Use as directed, 3 times daily before meals 100 each 12  . ibuprofen (ADVIL,MOTRIN) 800 MG tablet Take 1 tablet (800 mg total) by mouth 3 (three) times daily. 21 tablet 0  . TRUEPLUS LANCETS 28G MISC 1 each by Does not apply route 3 (three) times daily before meals. 100 each 12  . HYDROcodone-acetaminophen (NORCO/VICODIN) 5-325 MG tablet Take 1-2 tablets by mouth every 6 (six) hours as needed for severe pain. (Patient not  taking: Reported on 10/12/2016) 10 tablet 0  . terbinafine (LAMISIL) 250 MG tablet Take 1 tablet (250 mg total) by mouth daily. For 12 weeks (Patient not taking: Reported on 10/12/2016) 84 tablet 0  . atorvastatin (LIPITOR) 40 MG tablet Take 1 tablet (40 mg total) by mouth daily. (Patient not taking: Reported on 11/01/2016) 30 tablet 11  . glipiZIDE (GLUCOTROL) 10 MG tablet TAKE 1 TABLET BY MOUTH 2 TIMES DAILY BEFORE A MEAL. (Patient not taking: Reported on 11/01/2016) 60 tablet 0  . lisinopril (PRINIVIL,ZESTRIL) 40 MG tablet TAKE 1 TABLET BY MOUTH DAILY. (Patient not taking: Reported on 11/01/2016) 30 tablet 2   No facility-administered medications prior to visit.     ROS Review of Systems  Constitutional: Negative for activity change, appetite change and fatigue.  HENT: Negative for congestion, sinus pressure and sore throat.   Eyes: Negative for visual disturbance.  Respiratory: Negative for cough, chest tightness, shortness of breath and wheezing.   Cardiovascular: Negative for chest pain and palpitations.  Gastrointestinal: Negative for abdominal distention, abdominal pain and constipation.  Endocrine: Negative for polydipsia.  Genitourinary: Negative for dysuria and frequency.  Musculoskeletal: Negative for arthralgias and back pain.  Skin: Negative for rash.  Neurological: Negative for tremors, light-headedness and numbness.  Hematological: Does not bruise/bleed easily.  Psychiatric/Behavioral: Negative for agitation and behavioral problems.    Objective:  BP (!) 157/78 (BP Location: Right Arm, Patient Position: Sitting, Cuff  Size: Small)   Pulse (!) 104   Temp 98.3 F (36.8 C) (Oral)   Ht 5\' 6"  (1.676 m)   Wt 212 lb 12.8 oz (96.5 kg)   LMP 10/02/2016   SpO2 97%   BMI 34.35 kg/m   BP/Weight 11/01/2016 [id number]  Systolic BP [id number]  Diastolic BP 78 97 91  Wt. (Lbs) 212.8 225 217.2  BMI 34.35 36.32 35.07      Physical Exam  Constitutional: She is oriented  to person, place, and time. She appears well-developed and well-nourished.  Cardiovascular: Normal rate, normal heart sounds and intact distal pulses.   No murmur heard. Pulmonary/Chest: Effort normal and breath sounds normal. She has no wheezes. She has no rales. She exhibits no tenderness.  Abdominal: Soft. Bowel sounds are normal. She exhibits no distension and no mass. There is no tenderness.  Musculoskeletal: Normal range of motion.  Neurological: She is alert and oriented to person, place, and time.    Lab Results  Component Value Date   HGBA1C 10.1 11/01/2016    Assessment & Plan:   1. Uncontrolled type 2 diabetes mellitus without complication, without long-term current use of insulin (HCC) Uncontrolled with A1c of 10.1 due to noncompliance Compliance emphasized and meds refilled - Glucose (CBG), Fasting - HgB A1c - glipiZIDE (GLUCOTROL) 10 MG tablet; TAKE 1 TABLET BY MOUTH 2 TIMES DAILY BEFORE A MEAL.  Dispense: 60 tablet; Refill: 3 - COMPLETE METABOLIC PANEL WITH GFR - Lipid Panel w/reflex Direct LDL  2. Dyslipidemia associated with type 2 diabetes mellitus ([place]) If lipid panel is elevated we will make no changes to her regimen as she has not been compliant with her medications. Low cholesterol diet - atorvastatin (LIPITOR) 40 MG tablet; Take 1 tablet (40 mg total) by mouth daily.  Dispense: 30 tablet; Refill: 3  3. Essential hypertension Uncontrolled due to noncompliance Low-sodium diet - lisinopril (PRINIVIL,ZESTRIL) 40 MG tablet; Take 1 tablet (40 mg total) by mouth daily.  Dispense: 30 tablet; Refill: 3  4. Non compliance w medication regimen Implications of non compliance have been discussed with the patient   Meds ordered this encounter  Medications  . lisinopril (PRINIVIL,ZESTRIL) 40 MG tablet    Sig: Take 1 tablet (40 mg total) by mouth daily.    Dispense:  30 tablet    Refill:  3  . glipiZIDE (GLUCOTROL) 10 MG tablet    Sig: TAKE 1 TABLET BY MOUTH 2  TIMES DAILY BEFORE A MEAL.    Dispense:  60 tablet    Refill:  3  . atorvastatin (LIPITOR) 40 MG tablet    Sig: Take 1 tablet (40 mg total) by mouth daily.    Dispense:  30 tablet    Refill:  3    Follow-up: Return in about 3 months (around 01/29/2017) for follow up on Diabetes and Hypertension.   Arnoldo Morale MD

## 2016-11-01 NOTE — BH Specialist Note (Signed)
Session Start time: 4:00 PM   End Time: 430 PM Total Time:  30 minutes Type of Service: [place] Interpreter: No.   Interpreter Name & Language: N/A # [facility] Visits July 2017-June 2018: 1st   SUBJECTIVE: Sheila Bishop is a 46 y.o. female  Pt. was referred by Dr. Jarold Song for:  anxiety and depression. Pt. reports the following symptoms/concerns: overwhelming feelings of sadness and worry, increased appetite, and withdrawn behavior Duration of problem: Pt reported diagnosis of Bipolar and Major Depression in 2006. Pt participated in psychotherapy for four years until 2010. Pt is not receiving any behavioral health services Severity: Moderate Previous treatment: Pt participated in psychotherapy for four years with [facility]   OBJECTIVE: Mood: Pleasant & Affect: Appropriate Risk of harm to self or others: Pt denied SI/HI Assessments administered: PHQ9; GAD7  LIFE CONTEXT:  Family & Social: Pt has stable housing. She has four children and a large extended family who resides nearby School/ Work: Pt was recently promoted to a full-time position with [employer].  Self-Care: No report of substance use Life changes: Pt disclosed that she has been negligent of health; however, plans to comply with prescribed medications What is important to pt/family (values): Family, Good Health, Independence   GOALS ADDRESSED:  Decrease symptoms of depression Decrease symptoms of anxiety  INTERVENTIONS: Solution Focused, Strength-based and Supportive   ASSESSMENT:  Pt currently experiencing depression and anxiety triggered by ongoing medical concerns. She reports overwhelming feelings of sadness and worry, increased appetite, and withdrawn behavior. Although pt has a large family, she reports receiving limited support. Pt may benefit from psychotherapy and medication management. LCSWA educated pt on the cycle of depression and anxiety and discussed healthy coping skills to decrease  symptoms. Pt verbalized understanding of how mental and physical health correlates to one another, in addition, to the importance of medication compliance. Pt is not open to re-initiating behavorial health services. LCSWA and pt discussed plan to incorporate a healthy diet and pt was provided an A1C log to track levels. Pt was informed of community resources to address food insecurity and was provided community resources for crisis intervention. LCSWA discussed the benefits of scheduling an appointment with Financial Counseling to decrease financial strain; pt agreed to schedule appointment.       PLAN: 1. F/U with behavioral health clinician: Pt was encouraged to contact [facility] if symptoms worsen or fail to improve to schedule behavioral appointments at [facility]. 2. Behavioral Health meds: None reported 3. Behavioral recommendations: LCSWA recommends that pt apply healthy coping skills discussed and utilize community resources. Pt is encouraged to schedule follow up appointment with LCSWA 4. Referral: Brief Counseling/Psychotherapy, [employer], Problem-solving teaching/coping strategies, Psychoeducation and Supportive Counseling 5. From scale of 1-10, how likely are you to follow plan: [place], MSW, [place] Worker 11/02/16 4:38 PM  Warmhandoff:   Warm Hand Off Completed.

## 2016-11-01 NOTE — Progress Notes (Signed)
Refills on all meds- she states that she has been out of meds for over a month.

## 2016-11-10 NOTE — Progress Notes (Signed)
After several attempts to try and reach out to patient to discuss her lab results, Probation officer printed the labs and alon with a letter sent it to the patient.

## 2016-11-27 MED FILL — glipiZIDE 10 MG TABS: 10 | 30 days supply | Qty: 60 | Fill #1

## 2016-11-27 MED FILL — ?LISINOPRIL 40 MG TABLET: 40 MG | 30 days supply | Qty: 30 | Fill #1

## 2016-11-27 MED FILL — ATORVASTATIN 40 MG TABLET: 40 | 30 days supply | Qty: 30 | Fill #1

## 2016-12-26 MED FILL — ATORVASTATIN 40 MG TABLET: 40 | 30 days supply | Qty: 30 | Fill #2

## 2016-12-26 MED FILL — glipiZIDE 10 MG TABS: 10 | 30 days supply | Qty: 60 | Fill #2

## 2016-12-26 MED FILL — LISINOPRIL 40 MG TABLET: 40 | 30 days supply | Qty: 30 | Fill #2

## 2017-01-03 ENCOUNTER — Telehealth: Payer: Self-pay | Admitting: *Deleted

## 2017-01-03 ENCOUNTER — Encounter: Payer: Self-pay | Admitting: *Deleted

## 2017-01-03 NOTE — Telephone Encounter (Addendum)
Reason for walk-in: vomitting, light-headedness, HA Pt states sx's onset Monday with light headed and dizzy N/V onset Tuesday, unable to keep anything down. Attempted to eat saltines. Pt states she drinks lots of water.  Pt A&O,NAD, No slurred speech or facial assymetry assessed. Pt appears to be in good spirits. Gait is WNL. Pt states she has tried OTC ibuprofen for pain.  VS: T:98.8  P: 87 R: 16 SpO2: 100%  CBG: 257 BP taken from sitting to standing position. Sitting: 147/86  P: 87 Standing: 148/86 P: 87  Offered patient apt at 1015. Pt states she is unable to wait due to transportation. Offered next day appointment with Dr. Adrian Blackwater. Pt verbalized understanding if sx's worsen to go to ED.

## 2017-01-04 ENCOUNTER — Encounter: Payer: Self-pay | Admitting: Family Medicine

## 2017-01-04 ENCOUNTER — Ambulatory Visit: Payer: Self-pay | Attending: Family Medicine | Admitting: Family Medicine

## 2017-01-04 VITALS — BP 143/86 | HR 90 | Temp 98.2°F | Ht 66.0 in | Wt 223.8 lb

## 2017-01-04 DIAGNOSIS — D649 Anemia, unspecified: Secondary | ICD-10-CM

## 2017-01-04 DIAGNOSIS — R42 Dizziness and giddiness: Secondary | ICD-10-CM | POA: Insufficient documentation

## 2017-01-04 DIAGNOSIS — Z7984 Long term (current) use of oral hypoglycemic drugs: Secondary | ICD-10-CM | POA: Insufficient documentation

## 2017-01-04 DIAGNOSIS — R519 Headache, unspecified: Secondary | ICD-10-CM

## 2017-01-04 DIAGNOSIS — I10 Essential (primary) hypertension: Secondary | ICD-10-CM | POA: Insufficient documentation

## 2017-01-04 DIAGNOSIS — R51 Headache: Secondary | ICD-10-CM | POA: Insufficient documentation

## 2017-01-04 DIAGNOSIS — R112 Nausea with vomiting, unspecified: Secondary | ICD-10-CM | POA: Insufficient documentation

## 2017-01-04 DIAGNOSIS — E1100 Type 2 diabetes mellitus with hyperosmolarity without nonketotic hyperglycemic-hyperosmolar coma (NKHHC): Secondary | ICD-10-CM | POA: Insufficient documentation

## 2017-01-04 DIAGNOSIS — J0110 Acute frontal sinusitis, unspecified: Secondary | ICD-10-CM | POA: Insufficient documentation

## 2017-01-04 DIAGNOSIS — E669 Obesity, unspecified: Secondary | ICD-10-CM | POA: Insufficient documentation

## 2017-01-04 LAB — POCT URINALYSIS DIPSTICK
Bilirubin, UA: NEGATIVE
GLUCOSE UA: 500
KETONES UA: NEGATIVE
Leukocytes, UA: NEGATIVE
Nitrite, UA: NEGATIVE
Protein, UA: NEGATIVE
RBC UA: NEGATIVE
SPEC GRAV UA: 1.01 (ref 1.030–1.035)
UROBILINOGEN UA: 1 (ref ?–2.0)
pH, UA: 7 (ref 5.0–8.0)

## 2017-01-04 LAB — POCT GLYCOSYLATED HEMOGLOBIN (HGB A1C): Hemoglobin A1C: 10

## 2017-01-04 LAB — GLUCOSE, POCT (MANUAL RESULT ENTRY): POC GLUCOSE: 286 mg/dL — AB (ref 70–99)

## 2017-01-04 MED ORDER — SODIUM CHLORIDE 0.9 % IV BOLUS (SEPSIS)
1000.0000 mL | Freq: Once | INTRAVENOUS | Status: AC
  Administered 2017-01-04: 1000 mL via INTRAVENOUS

## 2017-01-04 MED ORDER — ACETAMINOPHEN 500 MG PO TABS
1000.0000 mg | ORAL_TABLET | Freq: Once | ORAL | Status: AC
  Administered 2017-01-04: 1000 mg via ORAL

## 2017-01-04 MED ORDER — AMOXICILLIN 500 MG PO CAPS: 500.0000 mg | ORAL_CAPSULE | Freq: Three times a day (TID) | ORAL | 0 refills | Status: DC

## 2017-01-04 MED ORDER — INSULIN ASPART 100 UNIT/ML ~~LOC~~ SOLN
10.0000 [IU] | Freq: Once | SUBCUTANEOUS | Status: AC
  Administered 2017-01-04: 10 [IU] via SUBCUTANEOUS

## 2017-01-04 MED ORDER — METFORMIN HCL ER 500 MG PO TB24: 500.0000 mg | ORAL_TABLET | Freq: Every day | ORAL | 1 refills | Status: DC

## 2017-01-04 MED FILL — AMOXICILLIN 500 MG CAPSULE: 500 | 10 days supply | Qty: 30 | Fill #0

## 2017-01-04 MED FILL — METFORMIN HCL ER 500 MG TAB: 500 | 30 days supply | Qty: 30 | Fill #0

## 2017-01-04 NOTE — Assessment & Plan Note (Signed)
Swollen nasal turbinates, frontal headache Symptoms x 3 days Suspect viral sinusitis vs allergic  Plan: Amoxicillin x 10 days course given severity of headache

## 2017-01-04 NOTE — Assessment & Plan Note (Signed)
A: uncontrolled Med: compliant P: Add metformin 500 mg XR nightly and slowly titrate up as tolerated  continue glipizide 10 mg BID

## 2017-01-04 NOTE — Assessment & Plan Note (Signed)
Recent nausea in vomiting and loose stools consistent with viral GI illness resolving. Suspect mild dehydration from excessive emesis and associated diabetes Plan: IV fluids CBC, CMP Adding metformin for diabetes control

## 2017-01-04 NOTE — Patient Instructions (Addendum)
Kiarrah was seen today for headache.  Diagnoses and all orders for this visit:  Uncontrolled type 2 diabetes mellitus with hyperosmolarity without coma, without long-term current use of insulin (HCC) -     POCT glucose (manual entry) -     insulin aspart (novoLOG) injection 10 Units; Inject 0.1 mLs (10 Units total) into the skin once. -     HgB A1c -     POCT urinalysis dipstick -     metFORMIN (GLUCOPHAGE XR) 500 MG 24 hr tablet; Take 1 tablet (500 mg total) by mouth daily after supper.  Non-intractable vomiting with nausea, unspecified vomiting type -     sodium chloride 0.9 % bolus 1,000 mL; Inject 1,000 mLs into the vein once. -     CMP14+EGFR -     CBC -     Lipase  Nonintractable headache, unspecified chronicity pattern, unspecified headache type -     acetaminophen (TYLENOL) tablet 1,000 mg; Take 2 tablets (1,000 mg total) by mouth once.  Acute non-recurrent frontal sinusitis -     amoxicillin (AMOXIL) 500 MG capsule; Take 1 capsule (500 mg total) by mouth 3 (three) times daily.  continue to drink plenty of fluids at home  f/u with Dr. Brita Romp in 2-3 weeks for diabetes  Dr. Adrian Blackwater

## 2017-01-04 NOTE — Progress Notes (Signed)
Subjective:  Patient ID: Sheila Bishop, female    DOB: 04/02/1971  Age: 46 y.o. MRN: [id number]  CC: Headache   HPI Sheila Bishop has uncontrolled diabetes, HTN, obesity she presents for   1. Headache: symptoms started 3 days ago while at work. She was lightheaded, dizzy, headache, nausea. She also has loose stools.   She completed her work day. She went back to work on Tuesday with the same symptoms. She vomited on Tuesday at work. She left work a few minutes early. She was walking home and continued to vomit. She vomited 5-6 times on Tuesday. She stayed home from work on yesterday. She last vomited Tuesday (2 days ago). Today she feels lightheaded, dizzy and her head is pounding in bilateral forehead. She denies nausea currently. She has diabetes. She is compliant with glipizide. She reports excessive thirst for the past several months.she denies chest pains, dysuria and fever.   Social History  Substance Use Topics  . Smoking status: Former Smoker    Quit date: 12/31/2007  . Smokeless tobacco: Never Used  . Alcohol use No     Outpatient Medications Prior to Visit  Medication Sig Dispense Refill  . atorvastatin (LIPITOR) 40 MG tablet Take 1 tablet (40 mg total) by mouth daily. 30 tablet 3  . Blood Glucose Monitoring Suppl (TRUE METRIX METER) DEVI 1 each by Does not apply route 3 (three) times daily before meals. 1 Device 0  . glipiZIDE (GLUCOTROL) 10 MG tablet TAKE 1 TABLET BY MOUTH 2 TIMES DAILY BEFORE A MEAL. 60 tablet 3  . glucose blood (TRUE METRIX BLOOD GLUCOSE TEST) test strip Use as directed, 3 times daily before meals 100 each 12  . ibuprofen (ADVIL,MOTRIN) 800 MG tablet Take 1 tablet (800 mg total) by mouth 3 (three) times daily. 21 tablet 0  . lisinopril (PRINIVIL,ZESTRIL) 40 MG tablet Take 1 tablet (40 mg total) by mouth daily. 30 tablet 3  . TRUEPLUS LANCETS 28G MISC 1 each by Does not apply route 3 (three) times daily before meals. 100 each 12  .  HYDROcodone-acetaminophen (NORCO/VICODIN) 5-325 MG tablet Take 1-2 tablets by mouth every 6 (six) hours as needed for severe pain. (Patient not taking: Reported on 10/12/2016) 10 tablet 0  . terbinafine (LAMISIL) 250 MG tablet Take 1 tablet (250 mg total) by mouth daily. For 12 weeks (Patient not taking: Reported on 10/12/2016) 84 tablet 0   No facility-administered medications prior to visit.     ROS Review of Systems  Constitutional: Positive for fatigue. Negative for chills and fever.  Eyes: Negative for visual disturbance.  Respiratory: Negative for shortness of breath.   Cardiovascular: Negative for chest pain.  Gastrointestinal: Negative for abdominal pain and blood in stool.  Musculoskeletal: Negative for arthralgias and back pain.  Skin: Negative for rash.  Allergic/Immunologic: Negative for immunocompromised state.  Neurological: Positive for dizziness, light-headedness and headaches.  Hematological: Negative for adenopathy. Does not bruise/bleed easily.  Psychiatric/Behavioral: Negative for dysphoric mood and suicidal ideas.    Objective:  BP (!) 143/86   Pulse 90   Temp 98.2 F (36.8 C) (Oral)   Ht '5\' 6"'$  (1.676 m)   Wt 223 lb 12.8 oz (101.5 kg)   LMP 12/27/2016   SpO2 100%   BMI 36.12 kg/m   BP/Weight 01/04/2017 11/01/2016 0/25/8527  Systolic BP 782 423 536  Diastolic BP 86 78 97  Wt. (Lbs) 223.8 212.8 225  BMI 36.12 34.35 36.32   Physical Exam  Constitutional: She  is oriented to person, place, and time. She appears well-developed and well-nourished. No distress.  HENT:  Head: Normocephalic and atraumatic.  Right Ear: Tympanic membrane, external ear and ear canal normal.  Left Ear: Tympanic membrane, external ear and ear canal normal.  Mouth/Throat: Mucous membranes are dry.  Cardiovascular: Normal rate, regular rhythm, normal heart sounds and intact distal pulses.   Pulses:      Radial pulses are 2+ on the right side, and 2+ on the left side.  Pulmonary/Chest:  Effort normal and breath sounds normal.  Abdominal: Soft. Bowel sounds are normal. She exhibits no distension and no mass. There is no tenderness. There is no rebound, no guarding and no CVA tenderness.  Musculoskeletal: She exhibits no edema.  Neurological: She is alert and oriented to person, place, and time.  Skin: Skin is warm and dry. No rash noted.  Psychiatric: She has a normal mood and affect.   Lab Results  Component Value Date   HGBA1C 10.1 11/01/2016  UA: 500 glucose, otherwise normal  CBG 286   Treated with 1000 mg of acetaminophen 1 L NS 10 U of novolog   Assessment & Plan:   Sheila Bishop was seen today for headache.  Diagnoses and all orders for this visit:  Uncontrolled type 2 diabetes mellitus with hyperosmolarity without coma, without long-term current use of insulin (HCC) -     POCT glucose (manual entry) -     insulin aspart (novoLOG) injection 10 Units; Inject 0.1 mLs (10 Units total) into the skin once. -     HgB A1c -     POCT urinalysis dipstick -     metFORMIN (GLUCOPHAGE XR) 500 MG 24 hr tablet; Take 1 tablet (500 mg total) by mouth daily after supper.  Non-intractable vomiting with nausea, unspecified vomiting type -     sodium chloride 0.9 % bolus 1,000 mL; Inject 1,000 mLs into the vein once. -     CMP14+EGFR -     CBC -     Lipase  Nonintractable headache, unspecified chronicity pattern, unspecified headache type -     acetaminophen (TYLENOL) tablet 1,000 mg; Take 2 tablets (1,000 mg total) by mouth once.  Acute non-recurrent frontal sinusitis -     amoxicillin (AMOXIL) 500 MG capsule; Take 1 capsule (500 mg total) by mouth 3 (three) times daily.    No orders of the defined types were placed in this encounter.   Follow-up: Return in about 3 weeks (around 01/25/2017) for diabetes .   Boykin Nearing MD

## 2017-01-05 ENCOUNTER — Telehealth: Payer: Self-pay

## 2017-01-05 LAB — CMP14+EGFR
A/G RATIO: 1.2 (ref 1.2–2.2)
ALK PHOS: 97 IU/L (ref 39–117)
ALT: 13 IU/L (ref 0–32)
AST: 8 IU/L (ref 0–40)
Albumin: 4.1 g/dL (ref 3.5–5.5)
BUN/Creatinine Ratio: 15 (ref 9–23)
BUN: 11 mg/dL (ref 6–24)
Bilirubin Total: 0.3 mg/dL (ref 0.0–1.2)
CALCIUM: 9.3 mg/dL (ref 8.7–10.2)
CO2: 26 mmol/L (ref 18–29)
Chloride: 98 mmol/L (ref 96–106)
Creatinine, Ser: 0.72 mg/dL (ref 0.57–1.00)
GFR calc Af Amer: 119 mL/min/{1.73_m2} (ref >59)
GFR, EST NON AFRICAN AMERICAN: 103 mL/min/{1.73_m2} (ref >59)
Globulin, Total: 3.4 g/dL (ref 1.5–4.5)
Glucose: 289 mg/dL — ABNORMAL HIGH (ref 65–99)
POTASSIUM: 4 mmol/L (ref 3.5–5.2)
Sodium: 138 mmol/L (ref 134–144)
Total Protein: 7.5 g/dL (ref 6.0–8.5)

## 2017-01-05 LAB — CBC
HEMOGLOBIN: 10.5 g/dL — AB (ref 11.1–15.9)
Hematocrit: 31.6 % — ABNORMAL LOW (ref 34.0–46.6)
MCH: 26.6 pg (ref 26.6–33.0)
MCHC: 33.2 g/dL (ref 31.5–35.7)
MCV: 80 fL (ref 79–97)
Platelets: 326 10*3/uL (ref 150–379)
RBC: 3.94 x10E6/uL (ref 3.77–5.28)
RDW: 13.8 % (ref 12.3–15.4)
WBC: 13.8 10*3/uL — ABNORMAL HIGH (ref 3.4–10.8)

## 2017-01-05 LAB — LIPASE: LIPASE: 38 U/L (ref 14–72)

## 2017-01-05 NOTE — Addendum Note (Signed)
Addended by: Boykin Nearing on: 01/05/2017 08:43 AM   Modules accepted: Orders

## 2017-01-05 NOTE — Telephone Encounter (Signed)
Pt was called and informed of lab results. 

## 2017-01-09 ENCOUNTER — Telehealth: Payer: Self-pay | Admitting: Family Medicine

## 2017-01-09 NOTE — Telephone Encounter (Signed)
Pt came in a nd dropped off some FMLA paperwork that she would like Dr Jarold Song to fill out for her and fax to [phone]. Please f/u. Thank you

## 2017-01-09 NOTE — Telephone Encounter (Signed)
Called back to patient Verified name and DOB She is having nausea, vomiting, diarrhea with metformin XR   She is also reports wheezing.   She denies shortness of breath. She feels tired right now. No nausea.   Recommend: Not taking metformin tonight Finish amoxicillin course Keep f/u appointment tomorrow AM  Patient agreed with recommendations and voiced understanding

## 2017-01-09 NOTE — Telephone Encounter (Signed)
Patient called the office to speak with PCP in regards to the side effects that she is experiencing since taking both metformin and amoxicillin. Pt stated that she has been vomiting and had constant bowel movements. Pt had to leave work. Pt wants to discontinue taking medication. Please follow up.  Thank you.

## 2017-01-10 ENCOUNTER — Ambulatory Visit: Payer: Self-pay | Attending: Family Medicine | Admitting: Family Medicine

## 2017-01-10 ENCOUNTER — Encounter: Payer: Self-pay | Admitting: Family Medicine

## 2017-01-10 VITALS — BP 155/79 | HR 88 | Temp 98.5°F | Ht 66.0 in | Wt 216.6 lb

## 2017-01-10 DIAGNOSIS — IMO0001 Reserved for inherently not codable concepts without codable children: Secondary | ICD-10-CM

## 2017-01-10 DIAGNOSIS — I10 Essential (primary) hypertension: Secondary | ICD-10-CM

## 2017-01-10 DIAGNOSIS — T50995A Adverse effect of other drugs, medicaments and biological substances, initial encounter: Secondary | ICD-10-CM | POA: Insufficient documentation

## 2017-01-10 DIAGNOSIS — T50905A Adverse effect of unspecified drugs, medicaments and biological substances, initial encounter: Secondary | ICD-10-CM

## 2017-01-10 DIAGNOSIS — R112 Nausea with vomiting, unspecified: Secondary | ICD-10-CM | POA: Insufficient documentation

## 2017-01-10 DIAGNOSIS — K529 Noninfective gastroenteritis and colitis, unspecified: Secondary | ICD-10-CM

## 2017-01-10 DIAGNOSIS — E1169 Type 2 diabetes mellitus with other specified complication: Secondary | ICD-10-CM

## 2017-01-10 DIAGNOSIS — E669 Obesity, unspecified: Secondary | ICD-10-CM | POA: Insufficient documentation

## 2017-01-10 DIAGNOSIS — T887XXA Unspecified adverse effect of drug or medicament, initial encounter: Secondary | ICD-10-CM

## 2017-01-10 DIAGNOSIS — F329 Major depressive disorder, single episode, unspecified: Secondary | ICD-10-CM | POA: Insufficient documentation

## 2017-01-10 DIAGNOSIS — E785 Hyperlipidemia, unspecified: Secondary | ICD-10-CM | POA: Insufficient documentation

## 2017-01-10 DIAGNOSIS — R197 Diarrhea, unspecified: Secondary | ICD-10-CM | POA: Insufficient documentation

## 2017-01-10 DIAGNOSIS — R109 Unspecified abdominal pain: Secondary | ICD-10-CM | POA: Insufficient documentation

## 2017-01-10 DIAGNOSIS — E1100 Type 2 diabetes mellitus with hyperosmolarity without nonketotic hyperglycemic-hyperosmolar coma (NKHHC): Secondary | ICD-10-CM

## 2017-01-10 DIAGNOSIS — R630 Anorexia: Secondary | ICD-10-CM | POA: Insufficient documentation

## 2017-01-10 DIAGNOSIS — Z7984 Long term (current) use of oral hypoglycemic drugs: Secondary | ICD-10-CM | POA: Insufficient documentation

## 2017-01-10 DIAGNOSIS — E1165 Type 2 diabetes mellitus with hyperglycemia: Secondary | ICD-10-CM

## 2017-01-10 LAB — POCT CBG (FASTING - GLUCOSE)-MANUAL ENTRY: GLUCOSE FASTING, POC: 224 mg/dL — AB (ref 70–99)

## 2017-01-10 MED ORDER — LISINOPRIL 40 MG PO TABS: 40.0000 mg | ORAL_TABLET | Freq: Every day | ORAL | 3 refills | Status: DC

## 2017-01-10 MED ORDER — PROMETHAZINE HCL 25 MG PO TABS: 25.0000 mg | ORAL_TABLET | Freq: Three times a day (TID) | ORAL | 0 refills | Status: DC | PRN

## 2017-01-10 MED ORDER — GLIPIZIDE 10 MG PO TABS: ORAL_TABLET | ORAL | 3 refills | Status: DC

## 2017-01-10 MED ORDER — ATORVASTATIN CALCIUM 40 MG PO TABS: 40.0000 mg | ORAL_TABLET | Freq: Every day | ORAL | 3 refills | Status: DC

## 2017-01-10 MED ORDER — CANAGLIFLOZIN 100 MG PO TABS: 100.0000 mg | ORAL_TABLET | Freq: Every day | ORAL | 3 refills | Status: DC

## 2017-01-10 MED ORDER — ONDANSETRON HCL 4 MG/2ML IJ SOLN
4.0000 mg | Freq: Once | INTRAMUSCULAR | Status: AC
  Administered 2017-01-10: 4 mg via INTRAMUSCULAR

## 2017-01-10 MED ORDER — DIPHENOXYLATE-ATROPINE 2.5-0.025 MG PO TABS: 1.0000 | ORAL_TABLET | Freq: Four times a day (QID) | ORAL | 0 refills | Status: DC | PRN

## 2017-01-10 MED FILL — PROMETHAZINE 25 MG TABLET: 25 | 6 days supply | Qty: 20 | Fill #0

## 2017-01-10 NOTE — Progress Notes (Signed)
Subjective:  Patient ID: Sheila Bishop, female    DOB: [birth year]  Age: 46 y.o. MRN: [id number]  CC: Abdominal Pain; Diarrhea; Nausea; Emesis; Anorexia; and Diabetes   HPI Sheila Bishop is a 46 year old female with a history of type 2 diabetes mellitus (A1c 10.3), hypertension, hyperlipidemia who presents today complaining of diarrhea, vomiting, abdominal pain which she describes that her "stomach knotting up".  She was seen by Dr. Adrian Blackwater 6 days ago for gastroenteritis and sinusitis and received a prescription for amoxicillin; metformin was also started at the time for optimization of glycemic control. The patient informs me that her symptoms improved but then 2 days ago she developed diarrhea and vomiting again which she attributes to the metformin. She has vomited only once today but did so several times yesterday. Took amoxicillin on metformin yesterday but has not taken anything today. She denies headaches, sinus symptoms, fever and has no blood in stools.  She works at [employer] and has been unable to go back to work. She dropped off on FMLA paperwork which she would like completed to this effect. Her appetite is reduced and she has not taken any medications today. Denies history of sick contacts.  Past Medical History:  Diagnosis Date  . Breast abscess Nov 2012   left  . Depression 2007  . Diabetes mellitus 2009  . Headache(784.0)   . Hypertension 2009  . Obesity   . Wears glasses     Past Surgical History:  Procedure Laterality Date  . BREAST BIOPSY  08/07/2011   Procedure: BREAST BIOPSY;  Surgeon: Joyice Faster. Cornett, MD;  Location: [facility];  Service: General;  Laterality: Left;  BIOPSY BREAST LEFT SIDE  . CESAREAN SECTION  2002    No Known Allergies   Outpatient Medications Prior to Visit  Medication Sig Dispense Refill  . Blood Glucose Monitoring Suppl (TRUE METRIX METER) DEVI 1 each by Does not apply route 3 (three) times daily before meals. 1 Device 0    . glucose blood (TRUE METRIX BLOOD GLUCOSE TEST) test strip Use as directed, 3 times daily before meals 100 each 12  . TRUEPLUS LANCETS 28G MISC 1 each by Does not apply route 3 (three) times daily before meals. 100 each 12  . glipiZIDE (GLUCOTROL) 10 MG tablet TAKE 1 TABLET BY MOUTH 2 TIMES DAILY BEFORE A MEAL. 60 tablet 3  . lisinopril (PRINIVIL,ZESTRIL) 40 MG tablet Take 1 tablet (40 mg total) by mouth daily. 30 tablet 3  . metFORMIN (GLUCOPHAGE XR) 500 MG 24 hr tablet Take 1 tablet (500 mg total) by mouth daily after supper. 30 tablet 1  . amoxicillin (AMOXIL) 500 MG capsule Take 1 capsule (500 mg total) by mouth 3 (three) times daily. (Patient not taking: Reported on 01/10/2017) 30 capsule 0  . ibuprofen (ADVIL,MOTRIN) 800 MG tablet Take 1 tablet (800 mg total) by mouth 3 (three) times daily. (Patient not taking: Reported on 01/10/2017) 21 tablet 0  . atorvastatin (LIPITOR) 40 MG tablet Take 1 tablet (40 mg total) by mouth daily. (Patient not taking: Reported on 01/10/2017) 30 tablet 3   No facility-administered medications prior to visit.     ROS Review of Systems  Constitutional: Positive for appetite change and fatigue. Negative for activity change.  HENT: Negative for congestion, sinus pressure and sore throat.   Eyes: Negative for visual disturbance.  Respiratory: Negative for cough, chest tightness, shortness of breath and wheezing.   Cardiovascular: Negative for chest pain and palpitations.  Gastrointestinal:  See history of present illness  Endocrine: Negative for polydipsia.  Genitourinary: Negative for dysuria and frequency.  Musculoskeletal: Negative for arthralgias and back pain.  Skin: Negative for rash.  Neurological: Negative for tremors, light-headedness and numbness.  Hematological: Does not bruise/bleed easily.  Psychiatric/Behavioral: Negative for agitation and behavioral problems.    Objective:  BP (!) 155/79 (BP Location: Right Arm, Patient Position:  Sitting, Cuff Size: Small)   Pulse 88   Temp 98.5 F (36.9 C) (Oral)   Ht 5\' 6"  (1.676 m)   Wt 216 lb 9.6 oz (98.2 kg)   LMP 12/27/2016   SpO2 98%   BMI 34.96 kg/m   BP/Weight 01/10/2017 01/04/2017 0/26/3785  Systolic BP 885 027 741  Diastolic BP 79 86 78  Wt. (Lbs) 216.6 223.8 212.8  BMI 34.96 36.12 34.35      Physical Exam  Constitutional: She is oriented to person, place, and time.  Ill looking, not dehydrated  Cardiovascular: Normal rate, normal heart sounds and intact distal pulses.   No murmur heard. Pulmonary/Chest: Effort normal and breath sounds normal. She has no wheezes. She has no rales. She exhibits no tenderness.  Abdominal: Soft. Bowel sounds are normal. She exhibits no distension and no mass. There is tenderness (slight diffuse abdominal tenderness).  Musculoskeletal: Normal range of motion.  Neurological: She is alert and oriented to person, place, and time.  Skin: Skin is warm and dry.  Psychiatric: She has a normal mood and affect.     Lab Results  Component Value Date   HGBA1C 10.0 01/04/2017    Assessment & Plan:   1. Uncontrolled type 2 diabetes mellitus with hyperosmolarity without coma, without long-term current use of insulin (HCC) - Glucose (CBG), Fasting  2. Uncontrolled type 2 diabetes mellitus without complication, without long-term current use of insulin (HCC) Uncontrolled A1c of 10.0 Unable to tolerate metformin due to GI side effects Invokana added to regimen Diabetic diet - canagliflozin (INVOKANA) 100 MG TABS tablet; Take 1 tablet (100 mg total) by mouth daily before breakfast.  Dispense: 30 tablet; Refill: 3 - glipiZIDE (GLUCOTROL) 10 MG tablet; TAKE 1 TABLET BY MOUTH 2 TIMES DAILY BEFORE A MEAL.  Dispense: 60 tablet; Refill: 3  3. Essential hypertension Uncontrolled Low-sodium diet If blood pressure remains elevated at next visit, regimen will be adjusted - lisinopril (PRINIVIL,ZESTRIL) 40 MG tablet; Take 1 tablet (40 mg total)  by mouth daily.  Dispense: 30 tablet; Refill: 3  4. Dyslipidemia associated with type 2 diabetes mellitus (HCC) Low cholesterol diet - atorvastatin (LIPITOR) 40 MG tablet; Take 1 tablet (40 mg total) by mouth daily.  Dispense: 30 tablet; Refill: 3  5. Gastroenteritis GI symptoms could be residual symptoms of gastroenteritis versus superimposed GI effects of amoxicillin and/or metformin Patient to return to work in 2 days and if not better to call the clinic for an extension. - promethazine (PHENERGAN) 25 MG tablet; Take 1 tablet (25 mg total) by mouth every 8 (eight) hours as needed for nausea or vomiting.  Dispense: 20 tablet; Refill: 0 - diphenoxylate-atropine (LOMOTIL) 2.5-0.025 MG tablet; Take 1 tablet by mouth 4 (four) times daily as needed for diarrhea or loose stools.  Dispense: 30 tablet; Refill: 0  6. Adverse effect of drug, initial encounter ? GI side effects of metformin versus amoxicillin   Meds ordered this encounter  Medications  . promethazine (PHENERGAN) 25 MG tablet    Sig: Take 1 tablet (25 mg total) by mouth every 8 (eight) hours as needed for  nausea or vomiting.    Dispense:  20 tablet    Refill:  0  . diphenoxylate-atropine (LOMOTIL) 2.5-0.025 MG tablet    Sig: Take 1 tablet by mouth 4 (four) times daily as needed for diarrhea or loose stools.    Dispense:  30 tablet    Refill:  0  . canagliflozin (INVOKANA) 100 MG TABS tablet    Sig: Take 1 tablet (100 mg total) by mouth daily before breakfast.    Dispense:  30 tablet    Refill:  3    Discontinue metformin  . glipiZIDE (GLUCOTROL) 10 MG tablet    Sig: TAKE 1 TABLET BY MOUTH 2 TIMES DAILY BEFORE A MEAL.    Dispense:  60 tablet    Refill:  3  . lisinopril (PRINIVIL,ZESTRIL) 40 MG tablet    Sig: Take 1 tablet (40 mg total) by mouth daily.    Dispense:  30 tablet    Refill:  3  . atorvastatin (LIPITOR) 40 MG tablet    Sig: Take 1 tablet (40 mg total) by mouth daily.    Dispense:  30 tablet    Refill:  3     Follow-up: Return in about 3 weeks (around 01/31/2017), or if symptoms worsen or fail to improve, for Follow-up on chronic medical conditions.   Arnoldo Morale MD

## 2017-01-10 NOTE — Progress Notes (Signed)
Dr. Adrian Blackwater d/c'd the metformin Patient has not taken anything today

## 2017-01-10 NOTE — Telephone Encounter (Signed)
Noted  

## 2017-01-10 NOTE — Patient Instructions (Signed)
Food Choices to Help Relieve Diarrhea, Adult When you have diarrhea, the foods you eat and your eating habits are very important. Choosing the right foods and drinks can help:  Relieve diarrhea.  Replace lost fluids and nutrients.  Prevent dehydration.  What general guidelines should I follow? Relieving diarrhea  Choose foods with less than 2 g or .07 oz. of fiber per serving.  Limit fats to less than 8 tsp (38 g or 1.34 oz.) a day.  Avoid the following: ? Foods and beverages sweetened with high-fructose corn syrup, honey, or sugar alcohols such as xylitol, sorbitol, and mannitol. ? Foods that contain a lot of fat or sugar. ? Fried, greasy, or spicy foods. ? High-fiber grains, breads, and cereals. ? Raw fruits and vegetables.  Eat foods that are rich in probiotics. These foods include dairy products such as yogurt and fermented milk products. They help increase healthy bacteria in the stomach and intestines (gastrointestinal tract, or GI tract).  If you have lactose intolerance, avoid dairy products. These may make your diarrhea worse.  Take medicine to help stop diarrhea (antidiarrheal medicine) only as told by your health care provider. Replacing nutrients  Eat small meals or snacks every 3-4 hours.  Eat bland foods, such as white rice, toast, or baked potato, until your diarrhea starts to get better. Gradually reintroduce nutrient-rich foods as tolerated or as told by your health care provider. This includes: ? Well-cooked protein foods. ? Peeled, seeded, and soft-cooked fruits and vegetables. ? Low-fat dairy products.  Take vitamin and mineral supplements as told by your health care provider. Preventing dehydration   Start by sipping water or a special solution to prevent dehydration (oral rehydration solution, ORS). Urine that is clear or pale yellow means that you are getting enough fluid.  Try to drink at least 8-10 cups of fluid each day to help replace lost  fluids.  You may add other liquids in addition to water, such as clear juice or decaffeinated sports drinks, as tolerated or as told by your health care provider.  Avoid drinks with caffeine, such as coffee, tea, or soft drinks.  Avoid alcohol. What foods are recommended? The items listed may not be a complete list. Talk with your health care provider about what dietary choices are best for you. Grains White rice. White, French, or pita breads (fresh or toasted), including plain rolls, buns, or bagels. White pasta. Saltine, soda, or graham crackers. Pretzels. Low-fiber cereal. Cooked cereals made with water (such as cornmeal, farina, or cream cereals). Plain muffins. Matzo. Melba toast. Zwieback. Vegetables Potatoes (without the skin). Most well-cooked and canned vegetables without skins or seeds. Tender lettuce. Fruits Apple sauce. Fruits canned in juice. Cooked apricots, cherries, grapefruit, peaches, pears, or plums. Fresh bananas and cantaloupe. Meats and other protein foods Baked or boiled chicken. Eggs. Tofu. Fish. Seafood. Smooth nut butters. Ground or well-cooked tender beef, ham, veal, lamb, pork, or poultry. Dairy Plain yogurt, kefir, and unsweetened liquid yogurt. Lactose-free milk, buttermilk, skim milk, or soy milk. Low-fat or nonfat hard cheese. Beverages Water. Low-calorie sports drinks. Fruit juices without pulp. Strained tomato and vegetable juices. Decaffeinated teas. Sugar-free beverages not sweetened with sugar alcohols. Oral rehydration solutions, if approved by your health care provider. Seasoning and other foods Bouillon, broth, or soups made from recommended foods. What foods are not recommended? The items listed may not be a complete list. Talk with your health care provider about what dietary choices are best for you. Grains Whole grain, whole wheat,   bran, or rye breads, rolls, pastas, and crackers. Wild or brown rice. Whole grain or bran cereals. Barley. Oats and  oatmeal. Corn tortillas or taco shells. Granola. Popcorn. Vegetables Raw vegetables. Fried vegetables. Cabbage, broccoli, Brussels sprouts, artichokes, baked beans, beet greens, corn, kale, legumes, peas, sweet potatoes, and yams. Potato skins. Cooked spinach and cabbage. Fruits Dried fruit, including raisins and dates. Raw fruits. Stewed or dried prunes. Canned fruits with syrup. Meat and other protein foods Fried or fatty meats. Deli meats. Chunky nut butters. Nuts and seeds. Beans and lentils. Bacon. Hot dogs. Sausage. Dairy High-fat cheeses. Whole milk, chocolate milk, and beverages made with milk, such as milk shakes. Half-and-half. Cream. sour cream. Ice cream. Beverages Caffeinated beverages (such as coffee, tea, soda, or energy drinks). Alcoholic beverages. Fruit juices with pulp. Prune juice. Soft drinks sweetened with high-fructose corn syrup or sugar alcohols. High-calorie sports drinks. Fats and oils Butter. Cream sauces. Margarine. Salad oils. Plain salad dressings. Olives. Avocados. Mayonnaise. Sweets and desserts Sweet rolls, doughnuts, and sweet breads. Sugar-free desserts sweetened with sugar alcohols such as xylitol and sorbitol. Seasoning and other foods Honey. Hot sauce. Chili powder. Gravy. Cream-based or milk-based soups. Pancakes and waffles. Summary  When you have diarrhea, the foods you eat and your eating habits are very important.  Make sure you get at least 8-10 cups of fluid each day, or enough to keep your urine clear or pale yellow.  Eat bland foods and gradually reintroduce healthy, nutrient-rich foods as tolerated, or as told by your health care provider.  Avoid high-fiber, fried, greasy, or spicy foods. This information is not intended to replace advice given to you by your health care provider. Make sure you discuss any questions you have with your health care provider. Document Released: 12/09/2003 Document Revised: 09/15/2016 Document Reviewed:  09/15/2016 Elsevier Interactive Patient Education  2017 [employer]  

## 2017-01-19 NOTE — Progress Notes (Signed)
Patient's FMLA paperwork completed.  Original was placed in the accordion folder- patient was notified.  A copy was placed to be scanned into patient's chart and a copy is in the FMLA folder in this RN's desk.

## 2017-01-22 MED FILL — ?ATORVASTATIN 40MG TABLET: 40 | 30 days supply | Qty: 30 | Fill #3

## 2017-01-22 MED FILL — LISINOPRIL 40 MG TABLET: 40 | 30 days supply | Qty: 30 | Fill #3

## 2017-01-22 MED FILL — glipiZIDE 10 MG TABS: 10 | 30 days supply | Qty: 60 | Fill #3

## 2017-02-06 ENCOUNTER — Ambulatory Visit: Payer: Self-pay | Attending: Family Medicine | Admitting: Family Medicine

## 2017-02-06 ENCOUNTER — Encounter: Payer: Self-pay | Admitting: Family Medicine

## 2017-02-06 VITALS — BP 109/72 | HR 99 | Temp 98.3°F | Resp 18 | Ht 66.0 in | Wt 222.0 lb

## 2017-02-06 DIAGNOSIS — F329 Major depressive disorder, single episode, unspecified: Secondary | ICD-10-CM | POA: Insufficient documentation

## 2017-02-06 DIAGNOSIS — E669 Obesity, unspecified: Secondary | ICD-10-CM | POA: Insufficient documentation

## 2017-02-06 DIAGNOSIS — E1165 Type 2 diabetes mellitus with hyperglycemia: Secondary | ICD-10-CM

## 2017-02-06 DIAGNOSIS — IMO0001 Reserved for inherently not codable concepts without codable children: Secondary | ICD-10-CM

## 2017-02-06 DIAGNOSIS — Z9119 Patient's noncompliance with other medical treatment and regimen: Secondary | ICD-10-CM | POA: Insufficient documentation

## 2017-02-06 DIAGNOSIS — I10 Essential (primary) hypertension: Secondary | ICD-10-CM

## 2017-02-06 DIAGNOSIS — Z794 Long term (current) use of insulin: Secondary | ICD-10-CM

## 2017-02-06 DIAGNOSIS — E1169 Type 2 diabetes mellitus with other specified complication: Secondary | ICD-10-CM

## 2017-02-06 DIAGNOSIS — E785 Hyperlipidemia, unspecified: Secondary | ICD-10-CM

## 2017-02-06 LAB — GLUCOSE, POCT (MANUAL RESULT ENTRY): POC GLUCOSE: 256 mg/dL — AB (ref 70–99)

## 2017-02-06 MED ORDER — LISINOPRIL 40 MG PO TABS: 40.0000 mg | ORAL_TABLET | Freq: Every day | ORAL | 3 refills | Status: DC

## 2017-02-06 MED ORDER — GLIPIZIDE 10 MG PO TABS: ORAL_TABLET | ORAL | 3 refills | Status: DC

## 2017-02-06 MED ORDER — INSULIN GLARGINE 100 UNIT/ML SOLOSTAR PEN: 10.0000 [IU] | PEN_INJECTOR | Freq: Every day | SUBCUTANEOUS | 3 refills | Status: DC

## 2017-02-06 MED ORDER — ATORVASTATIN CALCIUM 40 MG PO TABS: 40.0000 mg | ORAL_TABLET | Freq: Every day | ORAL | 3 refills | Status: DC

## 2017-02-06 MED ORDER — INSULIN PEN NEEDLE 31G X 5 MM MISC: 1.0000 | Freq: Every day | 5 refills | Status: DC

## 2017-02-06 NOTE — Progress Notes (Signed)
Subjective:  Patient ID: Sheila Bishop, female    DOB: [birth year]  Age: 46 y.o. MRN: [id number]  CC: Follow-up on diabetes  HPI Sheila Bishop is a 46 year old female with a history of type 2 diabetes mellitus (A1c 10.0), hypertension, hyperlipidemia who presents today for a follow-up visit.  She has not been compliant with  Invokana but has been taking her glipizide. She had been placed on metformin last month and had complained this causes diarrhea. The decision to discontinue Invokana was based on TV commercials in which "she saw that Invokana was recalled due to causing to amputations" She does not exercise and does not adhere to a diabetic diet.  Takes her antihypertensives and tolerates this well and also taking atorvastatin and with no complaints of myalgias.  She brings in FMLA paperwork to be completed requesting intermittent absences due to her diabetes. She has no other concerns today.  Past Medical History:  Diagnosis Date  . Breast abscess Nov 2012   left  . Depression 2007  . Diabetes mellitus 2009  . Headache(784.0)   . Hypertension 2009  . Obesity   . Wears glasses     Past Surgical History:  Procedure Laterality Date  . BREAST BIOPSY  08/07/2011   Procedure: BREAST BIOPSY;  Surgeon: Joyice Faster. Cornett, MD;  Location: [facility];  Service: General;  Laterality: Left;  BIOPSY BREAST LEFT SIDE  . CESAREAN SECTION  2002    No Known Allergies   Outpatient Medications Prior to Visit  Medication Sig Dispense Refill  . Blood Glucose Monitoring Suppl (TRUE METRIX METER) DEVI 1 each by Does not apply route 3 (three) times daily before meals. 1 Device 0  . glucose blood (TRUE METRIX BLOOD GLUCOSE TEST) test strip Use as directed, 3 times daily before meals 100 each 12  . TRUEPLUS LANCETS 28G MISC 1 each by Does not apply route 3 (three) times daily before meals. 100 each 12  . atorvastatin (LIPITOR) 40 MG tablet Take 1 tablet (40 mg total) by mouth daily. 30  tablet 3  . glipiZIDE (GLUCOTROL) 10 MG tablet TAKE 1 TABLET BY MOUTH 2 TIMES DAILY BEFORE A MEAL. 60 tablet 3  . lisinopril (PRINIVIL,ZESTRIL) 40 MG tablet Take 1 tablet (40 mg total) by mouth daily. 30 tablet 3  . promethazine (PHENERGAN) 25 MG tablet Take 1 tablet (25 mg total) by mouth every 8 (eight) hours as needed for nausea or vomiting. 20 tablet 0  . ibuprofen (ADVIL,MOTRIN) 800 MG tablet Take 1 tablet (800 mg total) by mouth 3 (three) times daily. (Patient not taking: Reported on 01/10/2017) 21 tablet 0  . amoxicillin (AMOXIL) 500 MG capsule Take 1 capsule (500 mg total) by mouth 3 (three) times daily. (Patient not taking: Reported on 01/10/2017) 30 capsule 0  . canagliflozin (INVOKANA) 100 MG TABS tablet Take 1 tablet (100 mg total) by mouth daily before breakfast. (Patient not taking: Reported on 02/06/2017) 30 tablet 3  . diphenoxylate-atropine (LOMOTIL) 2.5-0.025 MG tablet Take 1 tablet by mouth 4 (four) times daily as needed for diarrhea or loose stools. 30 tablet 0   No facility-administered medications prior to visit.     ROS Review of Systems  Constitutional: Negative for activity change, appetite change and fatigue.  HENT: Negative for congestion, sinus pressure and sore throat.   Eyes: Negative for visual disturbance.  Respiratory: Negative for cough, chest tightness, shortness of breath and wheezing.   Cardiovascular: Negative for chest pain and palpitations.  Gastrointestinal: Negative  for abdominal distention, abdominal pain and constipation.  Endocrine: Negative for polydipsia.  Genitourinary: Negative for dysuria and frequency.  Musculoskeletal: Negative for arthralgias and back pain.  Skin: Negative for rash.  Neurological: Negative for tremors, light-headedness and numbness.  Hematological: Does not bruise/bleed easily.  Psychiatric/Behavioral: Negative for agitation and behavioral problems.    Objective:  BP 109/72 (BP Location: Left Arm, Patient Position:  Sitting, Cuff Size: Normal)   Pulse 99   Temp 98.3 F (36.8 C) (Oral)   Resp 18   Ht 5\' 6"  (1.676 m)   Wt 222 lb (100.7 kg)   SpO2 98%   BMI 35.83 kg/m   BP/Weight 02/06/2017 1/75/1025 05/06/2777  Systolic BP 242 353 614  Diastolic BP 72 79 86  Wt. (Lbs) 222 216.6 223.8  BMI 35.83 34.96 36.12      Physical Exam  Constitutional: She is oriented to person, place, and time. She appears well-developed and well-nourished.  Cardiovascular: Normal rate, normal heart sounds and intact distal pulses.   No murmur heard. Pulmonary/Chest: Effort normal and breath sounds normal. She has no wheezes. She has no rales. She exhibits no tenderness.  Abdominal: Soft. Bowel sounds are normal. She exhibits no distension and no mass. There is no tenderness.  Musculoskeletal: Normal range of motion.  Neurological: She is alert and oriented to person, place, and time.  Skin: Skin is warm.  Psychiatric: She has a normal mood and affect.     Lab Results  Component Value Date   HGBA1C 10.0 01/04/2017   CMP Latest Ref Rng & Units 01/04/2017 11/01/2016 10/12/2016  Glucose 65 - 99 mg/dL 289(H) 225(H) 124(H)  BUN 6 - 24 mg/dL 11 14 10   Creatinine 0.57 - 1.00 mg/dL 0.72 0.63 0.58  Sodium 134 - 144 mmol/L 138 136 139  Potassium 3.5 - 5.2 mmol/L 4.0 3.7 3.2(L)  Chloride 96 - 106 mmol/L 98 100 106  CO2 18 - 29 mmol/L 26 23 24   Calcium 8.7 - 10.2 mg/dL 9.3 9.4 9.4  Total Protein 6.0 - 8.5 g/dL 7.5 7.7 -  Total Bilirubin 0.0 - 1.2 mg/dL 0.3 0.6 -  Alkaline Phos 39 - 117 IU/L 97 103 -  AST 0 - 40 IU/L 8 13 -  ALT 0 - 32 IU/L 13 13 -    Lipid Panel     Component Value Date/Time   CHOL 153 11/01/2016 1608   TRIG 90 11/01/2016 1608   HDL 39 (L) 11/01/2016 1608   CHOLHDL 3.9 11/01/2016 1608   VLDL 18 06/16/2010 2054   LDLCALC 144 (H) 06/16/2010 2054   LDLDIRECT 131 (H) 04/21/2010 2016     Assessment & Plan:   1. Dyslipidemia associated with type 2 diabetes mellitus (HCC) Uncontrolled Low  cholesterol diet Weight loss - Glucose (CBG) - atorvastatin (LIPITOR) 40 MG tablet; Take 1 tablet (40 mg total) by mouth daily.  Dispense: 30 tablet; Refill: 3 - Insulin Glargine (LANTUS SOLOSTAR) 100 UNIT/ML Solostar Pen; Inject 10 Units into the skin daily at 10 pm.  Dispense: 5 pen; Refill: 3  2. Uncontrolled type 2 diabetes mellitus without complication, with long-term current use of insulin ([place]) Uncontrolled with A1c of 10.0 Refuses to take Invokana due to TV commercials Commence Lantus, continue Glipizide  Previously unable to tolerate metformin Stressed importance of diabetic diet, lifestyle modifications - glipiZIDE (GLUCOTROL) 10 MG tablet; TAKE 1 TABLET BY MOUTH 2 TIMES DAILY BEFORE A MEAL.  Dispense: 60 tablet; Refill: 3  3. Essential hypertension Controlled - lisinopril (  PRINIVIL,ZESTRIL) 40 MG tablet; Take 1 tablet (40 mg total) by mouth daily.  Dispense: 30 tablet; Refill: 3 - Insulin Pen Needle 31G X 5 MM MISC; 1 each by Does not apply route at bedtime.  Dispense: 30 each; Refill: 5   Meds ordered this encounter  Medications  . atorvastatin (LIPITOR) 40 MG tablet    Sig: Take 1 tablet (40 mg total) by mouth daily.    Dispense:  30 tablet    Refill:  3  . glipiZIDE (GLUCOTROL) 10 MG tablet    Sig: TAKE 1 TABLET BY MOUTH 2 TIMES DAILY BEFORE A MEAL.    Dispense:  60 tablet    Refill:  3  . Insulin Glargine (LANTUS SOLOSTAR) 100 UNIT/ML Solostar Pen    Sig: Inject 10 Units into the skin daily at 10 pm.    Dispense:  5 pen    Refill:  3  . lisinopril (PRINIVIL,ZESTRIL) 40 MG tablet    Sig: Take 1 tablet (40 mg total) by mouth daily.    Dispense:  30 tablet    Refill:  3  . Insulin Pen Needle 31G X 5 MM MISC    Sig: 1 each by Does not apply route at bedtime.    Dispense:  30 each    Refill:  5    Follow-up: Return in about 3 months (around 05/09/2017) for Follow-up on diabetes mellitus.   Arnoldo Morale MD

## 2017-02-06 NOTE — Progress Notes (Signed)
Patient is here for f/up  Patient needs refill on Lipitor glipizide & lisinopril

## 2017-02-07 ENCOUNTER — Other Ambulatory Visit: Payer: Self-pay | Admitting: Family Medicine

## 2017-02-07 MED ORDER — INSULIN DETEMIR 100 UNIT/ML FLEXPEN: 10.0000 [IU] | PEN_INJECTOR | Freq: Two times a day (BID) | SUBCUTANEOUS | 3 refills | Status: DC

## 2017-02-07 MED FILL — LEVEMIR FLEXTOUCH 100 UNITS: 100 | 30 days supply | Qty: 6 | Fill #0

## 2017-02-07 MED FILL — TRUEPLUS PEN NDL 31GX3/16: 31G X 5 MM | 30 days supply | Qty: 100 | Fill #0

## 2017-02-07 MED FILL — TRUEPLUS PEN NDL 31GX3/16": 31G X 5 MM | 30 days supply | Qty: 100 | Fill #0

## 2017-02-27 ENCOUNTER — Other Ambulatory Visit: Payer: Self-pay | Admitting: Family Medicine

## 2017-02-27 DIAGNOSIS — IMO0001 Reserved for inherently not codable concepts without codable children: Secondary | ICD-10-CM

## 2017-02-27 DIAGNOSIS — E1165 Type 2 diabetes mellitus with hyperglycemia: Principal | ICD-10-CM

## 2017-02-27 MED FILL — LISINOPRIL 40 MG TABLET: 40 | 30 days supply | Qty: 30 | Fill #0

## 2017-02-27 MED FILL — ?ATORVASTATIN 40MG TABLET: 40 | 30 days supply | Qty: 30 | Fill #0

## 2017-02-27 MED FILL — ?GLIPIZIDE 10 MG TABLET: 10 | 30 days supply | Qty: 60 | Fill #0

## 2017-02-27 MED FILL — TRUE METRIX GLUCOSE TEST ST: 30 days supply | Qty: 100 | Fill #0

## 2017-03-05 ENCOUNTER — Other Ambulatory Visit: Payer: Self-pay | Admitting: Family Medicine

## 2017-03-05 DIAGNOSIS — E1165 Type 2 diabetes mellitus with hyperglycemia: Principal | ICD-10-CM

## 2017-03-05 DIAGNOSIS — IMO0001 Reserved for inherently not codable concepts without codable children: Secondary | ICD-10-CM

## 2017-03-05 MED FILL — TRUEplus LANCETS 28G MISC: 30 days supply | Qty: 100 | Fill #0

## 2017-03-05 MED FILL — LEVEMIR FLEXTOUCH 100 UNITS: 100 | 30 days supply | Qty: 6 | Fill #1

## 2017-03-26 MED FILL — LISINOPRIL 40 MG TABLET: 40 | 30 days supply | Qty: 30 | Fill #1

## 2017-03-26 MED FILL — ATORVASTATIN 40 MG TABLET: 40 | 30 days supply | Qty: 30 | Fill #1

## 2017-03-26 MED FILL — TRUEPLUS PEN NDL 31GX3/16: 31G X 5 MM | 30 days supply | Qty: 100 | Fill #1

## 2017-03-26 MED FILL — TRUEPLUS PEN NDL 31GX3/16": 31G X 5 MM | 30 days supply | Qty: 100 | Fill #1

## 2017-03-26 MED FILL — glipiZIDE 10 MG TABS: 10 | 30 days supply | Qty: 60 | Fill #1

## 2017-04-02 MED FILL — LEVEMIR FLEXTOUCH 100 UNITS: 100 | 30 days supply | Qty: 6 | Fill #2

## 2017-04-18 LAB — HM DIABETES EYE EXAM

## 2017-04-25 MED FILL — TRUE METRIX GLUCOSE TEST ST: 30 days supply | Qty: 100 | Fill #1

## 2017-04-25 MED FILL — LEVEMIR FLEXTOUCH 100 UNITS: 100 | 30 days supply | Qty: 6 | Fill #3

## 2017-04-25 MED FILL — glipiZIDE 10 MG TABS: 10 | 30 days supply | Qty: 60 | Fill #2

## 2017-04-25 MED FILL — LISINOPRIL 40 MG TABLET: 40 | 30 days supply | Qty: 30 | Fill #2

## 2017-04-25 MED FILL — ATORVASTATIN 40 MG TABLET: 40 | 30 days supply | Qty: 30 | Fill #2

## 2017-05-11 ENCOUNTER — Ambulatory Visit: Payer: 59 | Attending: Family Medicine | Admitting: Family Medicine

## 2017-05-11 ENCOUNTER — Encounter: Payer: Self-pay | Admitting: Family Medicine

## 2017-05-11 VITALS — BP 128/82 | HR 91 | Temp 98.3°F | Ht 66.0 in | Wt 222.8 lb

## 2017-05-11 DIAGNOSIS — E784 Other hyperlipidemia: Secondary | ICD-10-CM | POA: Insufficient documentation

## 2017-05-11 DIAGNOSIS — E1165 Type 2 diabetes mellitus with hyperglycemia: Secondary | ICD-10-CM | POA: Diagnosis not present

## 2017-05-11 DIAGNOSIS — I10 Essential (primary) hypertension: Secondary | ICD-10-CM | POA: Diagnosis not present

## 2017-05-11 DIAGNOSIS — Z9889 Other specified postprocedural states: Secondary | ICD-10-CM | POA: Insufficient documentation

## 2017-05-11 DIAGNOSIS — Z794 Long term (current) use of insulin: Secondary | ICD-10-CM | POA: Diagnosis not present

## 2017-05-11 DIAGNOSIS — L729 Follicular cyst of the skin and subcutaneous tissue, unspecified: Secondary | ICD-10-CM

## 2017-05-11 DIAGNOSIS — IMO0001 Reserved for inherently not codable concepts without codable children: Secondary | ICD-10-CM

## 2017-05-11 DIAGNOSIS — E669 Obesity, unspecified: Secondary | ICD-10-CM | POA: Diagnosis not present

## 2017-05-11 DIAGNOSIS — E1169 Type 2 diabetes mellitus with other specified complication: Secondary | ICD-10-CM | POA: Diagnosis not present

## 2017-05-11 DIAGNOSIS — E785 Hyperlipidemia, unspecified: Secondary | ICD-10-CM | POA: Diagnosis not present

## 2017-05-11 DIAGNOSIS — Z79899 Other long term (current) drug therapy: Secondary | ICD-10-CM | POA: Insufficient documentation

## 2017-05-11 LAB — GLUCOSE, POCT (MANUAL RESULT ENTRY): POC Glucose: 101 mg/dl — AB (ref 70–99)

## 2017-05-11 LAB — POCT GLYCOSYLATED HEMOGLOBIN (HGB A1C): HEMOGLOBIN A1C: 9.2

## 2017-05-11 MED ORDER — ONETOUCH ULTRA 2 W/DEVICE KIT: PACK | 0 refills | Status: DC

## 2017-05-11 MED ORDER — GLUCOSE BLOOD VI STRP: ORAL_STRIP | 12 refills | Status: DC

## 2017-05-11 MED ORDER — LISINOPRIL 40 MG PO TABS: 40.0000 mg | ORAL_TABLET | Freq: Every day | ORAL | 3 refills | Status: DC

## 2017-05-11 MED ORDER — ONETOUCH ULTRASOFT LANCETS MISC: 12 refills | Status: DC

## 2017-05-11 MED ORDER — ATORVASTATIN CALCIUM 40 MG PO TABS: 40.0000 mg | ORAL_TABLET | Freq: Every day | ORAL | 3 refills | Status: DC

## 2017-05-11 MED ORDER — GLIPIZIDE 10 MG PO TABS: ORAL_TABLET | ORAL | 3 refills | Status: DC

## 2017-05-11 MED ORDER — INSULIN DETEMIR 100 UNIT/ML FLEXPEN: 20.0000 [IU] | PEN_INJECTOR | Freq: Two times a day (BID) | SUBCUTANEOUS | 3 refills | Status: DC

## 2017-05-11 NOTE — Progress Notes (Signed)
Subjective:  Patient ID: Sheila Bishop, female    DOB: 1971-07-27  Age: 46 y.o. MRN: [id number]  CC: Diabetes   HPI Sheila Bishop s a 46 year old female with a history of type 2 diabetes mellitus (A1c 9.2), hypertension, hyperlipidemia who presents today for a follow-up visit.  She has been on Levemir 10 units twice daily and glipizide but her diabetes has been uncontrolled. She was previously on Invokana which she discontinued due to TV commercials and was unable to tolerate metformin due to GI side effects. Denies hypoglycemia or numbness in extremities. Up-to-date on annual eye exams. She does not exercise and does not adhere to a diabetic diet.  Takes her antihypertensives and tolerates this well and also taking atorvastatin and with no complaints of myalgias.  She complains of painful skin lesion on the lateral aspect of her right thigh which has been present for 3 months and has progressively increased in size. She would like to have this excised.  Past Medical History:  Diagnosis Date  . Breast abscess Nov 2012   left  . Depression 2007  . Diabetes mellitus 2009  . Headache(784.0)   . Hypertension 2009  . Obesity   . Wears glasses     Past Surgical History:  Procedure Laterality Date  . BREAST BIOPSY  08/07/2011   Procedure: BREAST BIOPSY;  Surgeon: Joyice Faster. Cornett, MD;  Location: [facility];  Service: General;  Laterality: Left;  BIOPSY BREAST LEFT SIDE  . CESAREAN SECTION  2002    No Known Allergies   Outpatient Medications Prior to Visit  Medication Sig Dispense Refill  . Insulin Pen Needle 31G X 5 MM MISC 1 each by Does not apply route at bedtime. 30 each 5  . atorvastatin (LIPITOR) 40 MG tablet Take 1 tablet (40 mg total) by mouth daily. 30 tablet 3  . Blood Glucose Monitoring Suppl (TRUE METRIX METER) DEVI 1 each by Does not apply route 3 (three) times daily before meals. 1 Device 0  . glipiZIDE (GLUCOTROL) 10 MG tablet TAKE 1 TABLET BY MOUTH 2 TIMES  DAILY BEFORE A MEAL. 60 tablet 3  . glucose blood test strip USE TO CHECK BLOOD SUGARS 3 TIMES DAILY BEFORE MEALS 100 each 12  . Insulin Detemir (LEVEMIR FLEXTOUCH) 100 UNIT/ML Pen Inject 10 Units into the skin 2 (two) times daily. 30 mL 3  . lisinopril (PRINIVIL,ZESTRIL) 40 MG tablet Take 1 tablet (40 mg total) by mouth daily. 30 tablet 3  . TRUEPLUS LANCETS 28G MISC USE TO CHECK BLOOD SUGARS 3 TIMES DAILY BEFORE MEALS 100 each 12  . ibuprofen (ADVIL,MOTRIN) 800 MG tablet Take 1 tablet (800 mg total) by mouth 3 (three) times daily. (Patient not taking: Reported on 01/10/2017) 21 tablet 0   No facility-administered medications prior to visit.     ROS Review of Systems Constitutional: Negative for activity change, appetite change and fatigue.  HENT: Negative for congestion, sinus pressure and sore throat.   Eyes: Negative for visual disturbance.  Respiratory: Negative for cough, chest tightness, shortness of breath and wheezing.   Cardiovascular: Negative for chest pain and palpitations.  Gastrointestinal: Negative for abdominal distention, abdominal pain and constipation.  Endocrine: Negative for polydipsia.  Genitourinary: Negative for dysuria and frequency.  Musculoskeletal: Negative for arthralgias and back pain.  Skin: see hpi.  Neurological: Negative for tremors, light-headedness and numbness.  Hematological: Does not bruise/bleed easily.  Psychiatric/Behavioral: Negative for agitation and behavioral problems.   Objective:  BP 128/82  Pulse 91   Temp 98.3 F (36.8 C) (Oral)   Ht '5\' 6"'$  (1.676 m)   Wt 222 lb 12.8 oz (101.1 kg)   SpO2 98%   BMI 35.96 kg/m   BP/Weight 05/11/2017 02/06/2017 9/67/8938  Systolic BP 101 751 025  Diastolic BP 82 72 79  Wt. (Lbs) 222.8 222 216.6  BMI 35.96 35.83 34.96      Physical Exam Constitutional: She is oriented to person, place, and time. She appears well-developed and well-nourished.  Cardiovascular: Normal rate, normal heart sounds  and intact distal pulses.   No murmur heard. Pulmonary/Chest: Effort normal and breath sounds normal. She has no wheezes. She has no rales. She exhibits no tenderness.  Abdominal: Soft. Bowel sounds are normal. She exhibits no distension and no mass. There is no tenderness.  Musculoskeletal: Normal range of motion.  Neurological: She is alert and oriented to person, place, and time.  Skin: Tender pedunculated lesion on lateral aspect of right thigh measuring 2 x 2 centimeters  Psychiatric: She has a normal mood and affect.   Lab Results  Component Value Date   HGBA1C 9.2 05/11/2017    CMP Latest Ref Rng & Units 01/04/2017 11/01/2016 10/12/2016  Glucose 65 - 99 mg/dL 289(H) 225(H) 124(H)  BUN 6 - 24 mg/dL '11 14 10  '$ Creatinine 0.57 - 1.00 mg/dL 0.72 0.63 0.58  Sodium 134 - 144 mmol/L 138 136 139  Potassium 3.5 - 5.2 mmol/L 4.0 3.7 3.2(L)  Chloride 96 - 106 mmol/L 98 100 106  CO2 18 - 29 mmol/L '26 23 24  '$ Calcium 8.7 - 10.2 mg/dL 9.3 9.4 9.4  Total Protein 6.0 - 8.5 g/dL 7.5 7.7 -  Total Bilirubin 0.0 - 1.2 mg/dL 0.3 0.6 -  Alkaline Phos 39 - 117 IU/L 97 103 -  AST 0 - 40 IU/L 8 13 -  ALT 0 - 32 IU/L 13 13 -     Assessment & Plan:   1. Uncontrolled type 2 diabetes mellitus without complication, with long-term current use of insulin ([place]) Uncontrolled with A1c of 9.2 I have spoken with the pharmacist and unfortunately all the insulins are on Tier3 on the patient's insurance Increase Levemir dose Diabetic diet - POCT glucose (manual entry) - POCT glycosylated hemoglobin (Hb A1C) - glipiZIDE (GLUCOTROL) 10 MG tablet; TAKE 1 TABLET BY MOUTH 2 TIMES DAILY BEFORE A MEAL.  Dispense: 60 tablet; Refill: 3 - Insulin Detemir (LEVEMIR FLEXTOUCH) 100 UNIT/ML Pen; Inject 20 Units into the skin 2 (two) times daily.  Dispense: 5 pen; Refill: 3 - Blood Glucose Monitoring Suppl (ONE TOUCH ULTRA 2) w/Device KIT; Use 3 times daily before meals  Dispense: 1 each; Refill: 0 - Lancets (ONETOUCH  ULTRASOFT) lancets; Use as instructed 3 times daily before meals  Dispense: 100 each; Refill: 12 - glucose blood test strip; Use as instructed 3 times daily before meals  Dispense: 100 each; Refill: 12  2. Essential hypertension Controlled - lisinopril (PRINIVIL,ZESTRIL) 40 MG tablet; Take 1 tablet (40 mg total) by mouth daily.  Dispense: 30 tablet; Refill: 3  3. Dyslipidemia associated with type 2 diabetes mellitus ([place]) Lipid panel at next visit - atorvastatin (LIPITOR) 40 MG tablet; Take 1 tablet (40 mg total) by mouth daily.  Dispense: 30 tablet; Refill: 3  4. Subcutaneous cyst - Ambulatory referral to General Surgery   Meds ordered this encounter  Medications  . glipiZIDE (GLUCOTROL) 10 MG tablet    Sig: TAKE 1 TABLET BY MOUTH 2 TIMES DAILY BEFORE A  MEAL.    Dispense:  60 tablet    Refill:  3  . Insulin Detemir (LEVEMIR FLEXTOUCH) 100 UNIT/ML Pen    Sig: Inject 20 Units into the skin 2 (two) times daily.    Dispense:  5 pen    Refill:  3    Discontinue previous dose  . lisinopril (PRINIVIL,ZESTRIL) 40 MG tablet    Sig: Take 1 tablet (40 mg total) by mouth daily.    Dispense:  30 tablet    Refill:  3  . atorvastatin (LIPITOR) 40 MG tablet    Sig: Take 1 tablet (40 mg total) by mouth daily.    Dispense:  30 tablet    Refill:  3  . Blood Glucose Monitoring Suppl (ONE TOUCH ULTRA 2) w/Device KIT    Sig: Use 3 times daily before meals    Dispense:  1 each    Refill:  0  . Lancets (ONETOUCH ULTRASOFT) lancets    Sig: Use as instructed 3 times daily before meals    Dispense:  100 each    Refill:  12  . glucose blood test strip    Sig: Use as instructed 3 times daily before meals    Dispense:  100 each    Refill:  12    Follow-up: Return in about 1 month (around 06/11/2017) for Complete physical exam.   This note has been created with [employer], quantity. Any transcriptional errors are unintentional.     Arnoldo Morale  MD

## 2017-05-11 NOTE — Progress Notes (Signed)
Pt has bump on left thigh. Pt would like to discuss a cheaper insulin.

## 2017-05-11 NOTE — Patient Instructions (Signed)
Diabetes Mellitus and Food It is important for you to manage your blood sugar (glucose) level. Your blood glucose level can be greatly affected by what you eat. Eating healthier foods in the appropriate amounts throughout the day at about the same time each day will help you control your blood glucose level. It can also help slow or prevent worsening of your diabetes mellitus. Healthy eating may even help you improve the level of your blood pressure and reach or maintain a healthy weight. General recommendations for healthful eating and cooking habits include:  Eating meals and snacks regularly. Avoid going long periods of time without eating to lose weight.  Eating a diet that consists mainly of plant-based foods, such as fruits, vegetables, nuts, legumes, and whole grains.  Using low-heat cooking methods, such as baking, instead of high-heat cooking methods, such as deep frying.  Work with your dietitian to make sure you understand how to use the Nutrition Facts information on food labels. How can food affect me? Carbohydrates Carbohydrates affect your blood glucose level more than any other type of food. Your dietitian will help you determine how many carbohydrates to eat at each meal and teach you how to count carbohydrates. Counting carbohydrates is important to keep your blood glucose at a healthy level, especially if you are using insulin or taking certain medicines for diabetes mellitus. Alcohol Alcohol can cause sudden decreases in blood glucose (hypoglycemia), especially if you use insulin or take certain medicines for diabetes mellitus. Hypoglycemia can be a life-threatening condition. Symptoms of hypoglycemia (sleepiness, dizziness, and disorientation) are similar to symptoms of having too much alcohol. If your health care provider has given you approval to drink alcohol, do so in moderation and use the following guidelines:  Women should not have more than one drink per day, and men  should not have more than two drinks per day. One drink is equal to: ? 12 oz of beer. ? 5 oz of wine. ? 1 oz of hard liquor.  Do not drink on an empty stomach.  Keep yourself hydrated. Have water, diet soda, or unsweetened iced tea.  Regular soda, juice, and other mixers might contain a lot of carbohydrates and should be counted.  What foods are not recommended? As you make food choices, it is important to remember that all foods are not the same. Some foods have fewer nutrients per serving than other foods, even though they might have the same number of calories or carbohydrates. It is difficult to get your body what it needs when you eat foods with fewer nutrients. Examples of foods that you should avoid that are high in calories and carbohydrates but low in nutrients include:  Trans fats (most processed foods list trans fats on the Nutrition Facts label).  Regular soda.  Juice.  Candy.  Sweets, such as cake, pie, doughnuts, and cookies.  Fried foods.  What foods can I eat? Eat nutrient-rich foods, which will nourish your body and keep you healthy. The food you should eat also will depend on several factors, including:  The calories you need.  The medicines you take.  Your weight.  Your blood glucose level.  Your blood pressure level.  Your cholesterol level.  You should eat a variety of foods, including:  Protein. ? Lean cuts of meat. ? Proteins low in saturated fats, such as fish, egg whites, and beans. Avoid processed meats.  Fruits and vegetables. ? Fruits and vegetables that may help control blood glucose levels, such as apples,   mangoes, and yams.  Dairy products. ? Choose fat-free or low-fat dairy products, such as milk, yogurt, and cheese.  Grains, bread, pasta, and rice. ? Choose whole grain products, such as multigrain bread, whole oats, and brown rice. These foods may help control blood pressure.  Fats. ? Foods containing healthful fats, such as  nuts, avocado, olive oil, canola oil, and fish.  Does everyone with diabetes mellitus have the same meal plan? Because every person with diabetes mellitus is different, there is not one meal plan that works for everyone. It is very important that you meet with a dietitian who will help you create a meal plan that is just right for you. This information is not intended to replace advice given to you by your health care provider. Make sure you discuss any questions you have with your health care provider. Document Released: 06/15/2005 Document Revised: 02/24/2016 Document Reviewed: 08/15/2013 Elsevier Interactive Patient Education  2017 [employer]  

## 2017-05-14 MED FILL — LEVEMIR FLEXTOUCH 100 UNITS: 100 | 30 days supply | Qty: 15 | Fill #0

## 2017-05-14 MED FILL — ONE TOUCH ULTRA TEST STRIPS: 30 days supply | Qty: 100 | Fill #0

## 2017-05-14 MED FILL — ONE TOUCH ULTRAMINI METER: W/DEVICE | 1 days supply | Qty: 1 | Fill #0

## 2017-05-17 MED FILL — TRUEPLUS PEN NDL 31GX3/16: 31G X 5 MM | 30 days supply | Qty: 100 | Fill #2

## 2017-05-17 MED FILL — TRUEPLUS PEN NDL 31GX3/16": 31G X 5 MM | 30 days supply | Qty: 100 | Fill #2

## 2017-05-28 ENCOUNTER — Other Ambulatory Visit: Payer: Self-pay | Admitting: Family Medicine

## 2017-05-28 DIAGNOSIS — E1165 Type 2 diabetes mellitus with hyperglycemia: Secondary | ICD-10-CM

## 2017-05-28 DIAGNOSIS — E1169 Type 2 diabetes mellitus with other specified complication: Secondary | ICD-10-CM

## 2017-05-28 DIAGNOSIS — E785 Hyperlipidemia, unspecified: Secondary | ICD-10-CM

## 2017-05-28 DIAGNOSIS — IMO0001 Reserved for inherently not codable concepts without codable children: Secondary | ICD-10-CM

## 2017-05-28 DIAGNOSIS — I10 Essential (primary) hypertension: Secondary | ICD-10-CM

## 2017-05-28 MED FILL — LISINOPRIL 40 MG TABLET: 40 | 30 days supply | Qty: 30 | Fill #3

## 2017-05-28 MED FILL — glipiZIDE 10 MG TABS: 10 | 30 days supply | Qty: 60 | Fill #3

## 2017-05-28 MED FILL — ATORVASTATIN 40 MG TABLET: 40 | 30 days supply | Qty: 30 | Fill #3

## 2017-05-30 MED FILL — ONE TOUCH DELICA 33G LANCET: 30 days supply | Qty: 100 | Fill #0

## 2017-06-18 MED FILL — LEVEMIR FLEXTOUCH 100 UNITS: 100 | 30 days supply | Qty: 15 | Fill #1

## 2017-06-25 ENCOUNTER — Telehealth: Payer: Self-pay | Admitting: Family Medicine

## 2017-06-25 DIAGNOSIS — E1165 Type 2 diabetes mellitus with hyperglycemia: Secondary | ICD-10-CM

## 2017-06-25 DIAGNOSIS — Z794 Long term (current) use of insulin: Secondary | ICD-10-CM

## 2017-06-25 DIAGNOSIS — IMO0001 Reserved for inherently not codable concepts without codable children: Secondary | ICD-10-CM

## 2017-06-25 DIAGNOSIS — E1169 Type 2 diabetes mellitus with other specified complication: Secondary | ICD-10-CM

## 2017-06-25 DIAGNOSIS — E785 Hyperlipidemia, unspecified: Principal | ICD-10-CM

## 2017-06-25 DIAGNOSIS — I10 Essential (primary) hypertension: Secondary | ICD-10-CM

## 2017-06-25 NOTE — Telephone Encounter (Signed)
Pt called for refill on glipizied and lisinopril and lipitor and send to [facility] please fu

## 2017-06-26 MED ORDER — ATORVASTATIN CALCIUM 40 MG PO TABS: 40.0000 mg | ORAL_TABLET | Freq: Every day | ORAL | 3 refills | Status: DC

## 2017-06-26 MED ORDER — LISINOPRIL 40 MG PO TABS: 40.0000 mg | ORAL_TABLET | Freq: Every day | ORAL | 3 refills | Status: DC

## 2017-06-26 MED ORDER — GLIPIZIDE 10 MG PO TABS: ORAL_TABLET | ORAL | 3 refills | Status: DC

## 2017-06-26 MED FILL — glipiZIDE 10 MG TABS: 10 | 30 days supply | Qty: 60 | Fill #0

## 2017-06-26 MED FILL — ATORVASTATIN 40 MG TABLET: 40 | 30 days supply | Qty: 30 | Fill #0

## 2017-06-26 MED FILL — LISINOPRIL 40 MG TABLET: 40 | 30 days supply | Qty: 30 | Fill #0

## 2017-06-26 NOTE — Telephone Encounter (Signed)
Requested medications refilled 

## 2017-06-27 ENCOUNTER — Encounter: Payer: 59 | Admitting: Family Medicine

## 2017-07-23 MED FILL — LISINOPRIL 40 MG TABLET: 40 | 30 days supply | Qty: 30 | Fill #0

## 2017-07-23 MED FILL — TRUEPLUS PEN NDL 31GX3/16: 31G X 5 MM | 30 days supply | Qty: 100 | Fill #3

## 2017-07-23 MED FILL — LEVEMIR FLEXTOUCH 100 UNITS: 100 | 30 days supply | Qty: 12 | Fill #2

## 2017-07-23 MED FILL — TRUEPLUS PEN NDL 31GX3/16": 31G X 5 MM | 30 days supply | Qty: 100 | Fill #3

## 2017-07-23 MED FILL — ATORVASTATIN 40 MG TABLET: 40 | 30 days supply | Qty: 30 | Fill #0

## 2017-07-23 MED FILL — glipiZIDE 10 MG TABS: 10 | 30 days supply | Qty: 60 | Fill #0

## 2017-08-20 MED FILL — LEVEMIR FLEXTOUCH 100 UNITS: 100 | 30 days supply | Qty: 12 | Fill #3

## 2017-08-27 MED FILL — ATORVASTATIN 40 MG TABLET: 40 | 30 days supply | Qty: 30 | Fill #1

## 2017-08-27 MED FILL — glipiZIDE 10 MG TABS: 10 | 30 days supply | Qty: 60 | Fill #1

## 2017-08-27 MED FILL — LISINOPRIL 40 MG TAB: 40 | 30 days supply | Qty: 30 | Fill #1

## 2017-08-27 MED FILL — ONE TOUCH ULTRA TEST STRIPS: 30 days supply | Qty: 100 | Fill #1

## 2017-09-10 ENCOUNTER — Ambulatory Visit: Payer: Self-pay | Admitting: Family Medicine

## 2017-09-12 ENCOUNTER — Telehealth: Payer: Self-pay | Admitting: Family Medicine

## 2017-09-12 DIAGNOSIS — E1165 Type 2 diabetes mellitus with hyperglycemia: Secondary | ICD-10-CM

## 2017-09-12 DIAGNOSIS — IMO0001 Reserved for inherently not codable concepts without codable children: Secondary | ICD-10-CM

## 2017-09-12 DIAGNOSIS — Z794 Long term (current) use of insulin: Secondary | ICD-10-CM

## 2017-09-12 DIAGNOSIS — E785 Hyperlipidemia, unspecified: Principal | ICD-10-CM

## 2017-09-12 DIAGNOSIS — I10 Essential (primary) hypertension: Secondary | ICD-10-CM

## 2017-09-12 DIAGNOSIS — E1169 Type 2 diabetes mellitus with other specified complication: Secondary | ICD-10-CM

## 2017-09-12 NOTE — Telephone Encounter (Signed)
Patient called requesting a medication refill on all medication she has an appointment on 10/22/17 due to the weather I couldn't get her in sooner.

## 2017-09-13 MED ORDER — ATORVASTATIN CALCIUM 40 MG PO TABS
40.0000 mg | ORAL_TABLET | Freq: Every day | ORAL | 3 refills | Status: DC
Start: 2017-09-13 — End: 2017-10-22

## 2017-09-13 MED ORDER — INSULIN DETEMIR 100 UNIT/ML FLEXPEN: 20.0000 [IU] | PEN_INJECTOR | Freq: Two times a day (BID) | SUBCUTANEOUS | 3 refills | Status: DC

## 2017-09-13 MED ORDER — GLIPIZIDE 10 MG PO TABS: ORAL_TABLET | ORAL | 3 refills | Status: DC

## 2017-09-13 MED ORDER — LISINOPRIL 40 MG PO TABS: 40.0000 mg | ORAL_TABLET | Freq: Every day | ORAL | 3 refills | Status: DC

## 2017-09-13 MED FILL — LEVEMIR FLEXTOUCH 100 UNITS: 100 | 30 days supply | Qty: 12 | Fill #0

## 2017-09-13 NOTE — Telephone Encounter (Signed)
Refilled

## 2017-09-26 ENCOUNTER — Other Ambulatory Visit: Payer: Self-pay | Admitting: Family Medicine

## 2017-09-26 DIAGNOSIS — IMO0001 Reserved for inherently not codable concepts without codable children: Secondary | ICD-10-CM

## 2017-09-26 DIAGNOSIS — E1165 Type 2 diabetes mellitus with hyperglycemia: Secondary | ICD-10-CM

## 2017-09-26 DIAGNOSIS — E785 Hyperlipidemia, unspecified: Secondary | ICD-10-CM

## 2017-09-26 DIAGNOSIS — I10 Essential (primary) hypertension: Secondary | ICD-10-CM

## 2017-09-26 DIAGNOSIS — E1169 Type 2 diabetes mellitus with other specified complication: Secondary | ICD-10-CM

## 2017-09-26 MED FILL — ATORVASTATIN 40 MG TABLET: 40 | 30 days supply | Qty: 30 | Fill #2

## 2017-09-26 MED FILL — glipiZIDE 10 MG TABS: 10 | 30 days supply | Qty: 60 | Fill #2

## 2017-09-26 MED FILL — TRUEPLUS PEN NDL 31GX3/16: 31G X 5 MM | 30 days supply | Qty: 100 | Fill #4

## 2017-09-26 MED FILL — LISINOPRIL 40 MG TAB: 40 | 30 days supply | Qty: 30 | Fill #0

## 2017-09-26 MED FILL — TRUEPLUS PEN NDL 31GX3/16": 31G X 5 MM | 30 days supply | Qty: 100 | Fill #4

## 2017-10-16 ENCOUNTER — Ambulatory Visit: Payer: 59 | Attending: Internal Medicine | Admitting: Internal Medicine

## 2017-10-16 ENCOUNTER — Encounter: Payer: Self-pay | Admitting: Internal Medicine

## 2017-10-16 VITALS — BP 148/90 | HR 92 | Temp 98.9°F | Resp 16 | Wt 231.0 lb

## 2017-10-16 DIAGNOSIS — Z87891 Personal history of nicotine dependence: Secondary | ICD-10-CM | POA: Diagnosis not present

## 2017-10-16 DIAGNOSIS — Z794 Long term (current) use of insulin: Secondary | ICD-10-CM | POA: Insufficient documentation

## 2017-10-16 DIAGNOSIS — I10 Essential (primary) hypertension: Secondary | ICD-10-CM | POA: Insufficient documentation

## 2017-10-16 DIAGNOSIS — J069 Acute upper respiratory infection, unspecified: Secondary | ICD-10-CM | POA: Diagnosis not present

## 2017-10-16 DIAGNOSIS — Z79899 Other long term (current) drug therapy: Secondary | ICD-10-CM | POA: Insufficient documentation

## 2017-10-16 DIAGNOSIS — E1165 Type 2 diabetes mellitus with hyperglycemia: Secondary | ICD-10-CM | POA: Diagnosis not present

## 2017-10-16 DIAGNOSIS — E785 Hyperlipidemia, unspecified: Secondary | ICD-10-CM | POA: Insufficient documentation

## 2017-10-16 LAB — POCT RAPID STREP A (OFFICE): RAPID STREP A SCREEN: NEGATIVE

## 2017-10-16 LAB — GLUCOSE, POCT (MANUAL RESULT ENTRY): POC Glucose: 92 mg/dl (ref 70–99)

## 2017-10-16 MED ORDER — LOPERAMIDE HCL 2 MG PO CAPS: ORAL_CAPSULE | ORAL | 0 refills | Status: DC

## 2017-10-16 MED ORDER — BENZONATATE 100 MG PO CAPS: 100.0000 mg | ORAL_CAPSULE | Freq: Two times a day (BID) | ORAL | 0 refills | Status: DC | PRN

## 2017-10-16 MED FILL — BENZONATATE 100 MG CAPSULE: 100 | 10 days supply | Qty: 20 | Fill #0

## 2017-10-16 NOTE — Progress Notes (Signed)
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

## 2017-10-16 NOTE — Progress Notes (Signed)
Patient ID: UNIQUE SILLAS, female    DOB: 16-Mar-1971  MRN: [id number]  CC: URI (possible)   Subjective: Sheila Bishop is a 47 y.o. female who presents for UC visit Her concerns today include:  Pt with hx of DM , HL and HTN.  Pt c/o sore and itchy throat, cough productive of green to yellow phlegm x 5 days. No fever. No SOB -diarrhea this a.m. No blood in stools -no body aches Taking DM tussin Sent home from work today.  Works in [employer] (medical) Patient Active Problem List   Diagnosis Date Noted  . Non-intractable vomiting with nausea 01/04/2017  . Acute non-recurrent frontal sinusitis 01/04/2017  . Irregular menses 02/10/2015  . Dyslipidemia associated with type 2 diabetes mellitus ([place]) 02/10/2015  . Diabetes type 2, uncontrolled ([place]) 11/26/2014  . Pain due to onychomycosis of toenail 11/26/2014  . Hypertension 11/01/2014     Current Outpatient Medications on File Prior to Visit  Medication Sig Dispense Refill  . atorvastatin (LIPITOR) 40 MG tablet Take 1 tablet (40 mg total) by mouth daily. 30 tablet 3  . glipiZIDE (GLUCOTROL) 10 MG tablet TAKE 1 TABLET BY MOUTH 2 TIMES DAILY BEFORE A MEAL. 60 tablet 3  . Insulin Detemir (LEVEMIR FLEXTOUCH) 100 UNIT/ML Pen Inject 20 Units into the skin 2 (two) times daily. 5 pen 3  . lisinopril (PRINIVIL,ZESTRIL) 40 MG tablet Take 1 tablet (40 mg total) by mouth daily. 30 tablet 3  . Blood Glucose Monitoring Suppl (ONE TOUCH ULTRA 2) w/Device KIT Use 3 times daily before meals 1 each 0  . glucose blood test strip Use as instructed 3 times daily before meals 100 each 12  . ibuprofen (ADVIL,MOTRIN) 800 MG tablet Take 1 tablet (800 mg total) by mouth 3 (three) times daily. (Patient not taking: Reported on 01/10/2017) 21 tablet 0  . Insulin Pen Needle 31G X 5 MM MISC 1 each by Does not apply route at bedtime. 30 each 5  . Lancets (ONETOUCH ULTRASOFT) lancets Use as instructed 3 times daily before meals 100 each 12   No current  facility-administered medications on file prior to visit.     No Known Allergies  Social History   Socioeconomic History  . Marital status: Legally Separated    Spouse name: Not on file  . Number of children: Not on file  . Years of education: Not on file  . Highest education level: Not on file  Social Needs  . Financial resource strain: Not on file  . Food insecurity - worry: Not on file  . Food insecurity - inability: Not on file  . Transportation needs - medical: Not on file  . Transportation needs - non-medical: Not on file  Occupational History  . Not on file  Tobacco Use  . Smoking status: Former Smoker    Last attempt to quit: 12/31/2007    Years since quitting: 9.8  . Smokeless tobacco: Never Used  Substance and Sexual Activity  . Alcohol use: No  . Drug use: No  . Sexual activity: Not on file  Other Topics Concern  . Not on file  Social History Narrative  . Not on file    Family History  Problem Relation Age of Onset  . Hyperlipidemia Mother   . Hypertension Mother   . Heart disease Mother     Past Surgical History:  Procedure Laterality Date  . BREAST BIOPSY  08/07/2011   Procedure: BREAST BIOPSY;  Surgeon: Joyice Faster. Cornett, MD;  Location:  [facility] OR;  Service: General;  Laterality: Left;  BIOPSY BREAST LEFT SIDE  . CESAREAN SECTION  2002    ROS: Review of Systems Negative except as stated above PHYSICAL EXAM: BP (!) 148/90   Pulse 92   Temp 98.9 F (37.2 C) (Oral)   Resp 16   Wt 231 lb (104.8 kg)   SpO2 97%   BMI 37.28 kg/m   Physical Exam General appearance -middle-aged African-American female who appears uncomfortable but in NAD. Mental status - alert, oriented to person, place, and time, normal mood, behavior, speech, dress, motor activity, and thought processes Nose -nasal mucosa dry. Mouth -no oral lesions.  Oral mucosa moist.  No redness or exudates of the pharynx. Neck - supple, no significant adenopathy Chest - clear to auscultation,  no wheezes, rales or rhonchi, symmetric air entry Heart - normal rate, regular rhythm, normal S1, S2, no murmurs, rubs, clicks or gallops  Results for orders placed or performed in visit on 10/16/17  POCT glucose (manual entry)  Result Value Ref Range   POC Glucose 92 70 - 99 mg/dl  Rapid Strep A  Result Value Ref Range   Rapid Strep A Screen Negative Negative     ASSESSMENT AND PLAN: 1. Viral upper respiratory tract infection -We will treat symptomatically.  Recommend gargling with warm water and salt for the sore throat. -Work excuse given. - Rapid Strep A - loperamide (IMODIUM A-D) 2 MG capsule; 2 mg Q 6 hrs PRN  Dispense: 12 capsule; Refill: 0 - benzonatate (TESSALON) 100 MG capsule; Take 1 capsule (100 mg total) by mouth 2 (two) times daily as needed for cough.  Dispense: 20 capsule; Refill: 0  2. Uncontrolled type 2 diabetes mellitus with hyperglycemia ([place]) Not addressed today.  Patient to follow-up with PCP. - POCT glucose (manual entry)  Patient was given the opportunity to ask questions.  Patient verbalized understanding of the plan and was able to repeat key elements of the plan.   Orders Placed This Encounter  Procedures  . POCT glucose (manual entry)  . Rapid Strep A     Requested Prescriptions   Signed Prescriptions Disp Refills  . loperamide (IMODIUM A-D) 2 MG capsule 12 capsule 0    Sig: 2 mg Q 6 hrs PRN  . benzonatate (TESSALON) 100 MG capsule 20 capsule 0    Sig: Take 1 capsule (100 mg total) by mouth 2 (two) times daily as needed for cough.    Return if symptoms worsen or fail to improve.  Karle Plumber, MD, FACP

## 2017-10-16 NOTE — Patient Instructions (Signed)
Gargle with warm water mix with a little salt to sooth the throat.  Upper Respiratory Infection, Adult Most upper respiratory infections (URIs) are caused by a virus. A URI affects the nose, throat, and upper air passages. The most common type of URI is often called "the common cold." Follow these instructions at home:  Take medicines only as told by your doctor.  Gargle warm saltwater or take cough drops to comfort your throat as told by your doctor.  Use a warm mist humidifier or inhale steam from a shower to increase air moisture. This may make it easier to breathe.  Drink enough fluid to keep your pee (urine) clear or pale yellow.  Eat soups and other clear broths.  Have a healthy diet.  Rest as needed.  Go back to work when your fever is gone or your doctor says it is okay. ? You may need to stay home longer to avoid giving your URI to others. ? You can also wear a face mask and wash your hands often to prevent spread of the virus.  Use your inhaler more if you have asthma.  Do not use any tobacco products, including cigarettes, chewing tobacco, or electronic cigarettes. If you need help quitting, ask your doctor. Contact a doctor if:  You are getting worse, not better.  Your symptoms are not helped by medicine.  You have chills.  You are getting more short of breath.  You have brown or red mucus.  You have yellow or brown discharge from your nose.  You have pain in your face, especially when you bend forward.  You have a fever.  You have puffy (swollen) neck glands.  You have pain while swallowing.  You have white areas in the back of your throat. Get help right away if:  You have very bad or constant: ? Headache. ? Ear pain. ? Pain in your forehead, behind your eyes, and over your cheekbones (sinus pain). ? Chest pain.  You have long-lasting (chronic) lung disease and any of the following: ? Wheezing. ? Long-lasting cough. ? Coughing up blood. ? A  change in your usual mucus.  You have a stiff neck.  You have changes in your: ? Vision. ? Hearing. ? Thinking. ? Mood. This information is not intended to replace advice given to you by your health care provider. Make sure you discuss any questions you have with your health care provider. Document Released: 03/06/2008 Document Revised: 05/21/2016 Document Reviewed: 12/24/2013 Elsevier Interactive Patient Education  2018 [employer].

## 2017-10-22 ENCOUNTER — Encounter: Payer: Self-pay | Admitting: Family Medicine

## 2017-10-22 ENCOUNTER — Ambulatory Visit: Payer: 59 | Attending: Family Medicine | Admitting: Family Medicine

## 2017-10-22 VITALS — BP 127/83 | HR 87 | Temp 97.7°F | Ht 66.0 in | Wt 227.6 lb

## 2017-10-22 DIAGNOSIS — E669 Obesity, unspecified: Secondary | ICD-10-CM | POA: Diagnosis not present

## 2017-10-22 DIAGNOSIS — I10 Essential (primary) hypertension: Secondary | ICD-10-CM

## 2017-10-22 DIAGNOSIS — E1169 Type 2 diabetes mellitus with other specified complication: Secondary | ICD-10-CM

## 2017-10-22 DIAGNOSIS — F329 Major depressive disorder, single episode, unspecified: Secondary | ICD-10-CM | POA: Diagnosis not present

## 2017-10-22 DIAGNOSIS — E1165 Type 2 diabetes mellitus with hyperglycemia: Secondary | ICD-10-CM | POA: Diagnosis not present

## 2017-10-22 DIAGNOSIS — Z79899 Other long term (current) drug therapy: Secondary | ICD-10-CM | POA: Diagnosis not present

## 2017-10-22 DIAGNOSIS — IMO0001 Reserved for inherently not codable concepts without codable children: Secondary | ICD-10-CM

## 2017-10-22 DIAGNOSIS — E11649 Type 2 diabetes mellitus with hypoglycemia without coma: Secondary | ICD-10-CM

## 2017-10-22 DIAGNOSIS — Z794 Long term (current) use of insulin: Secondary | ICD-10-CM | POA: Insufficient documentation

## 2017-10-22 DIAGNOSIS — E785 Hyperlipidemia, unspecified: Secondary | ICD-10-CM | POA: Diagnosis not present

## 2017-10-22 DIAGNOSIS — E119 Type 2 diabetes mellitus without complications: Secondary | ICD-10-CM | POA: Diagnosis present

## 2017-10-22 LAB — GLUCOSE, POCT (MANUAL RESULT ENTRY): POC GLUCOSE: 100 mg/dL — AB (ref 70–99)

## 2017-10-22 LAB — POCT GLYCOSYLATED HEMOGLOBIN (HGB A1C): Hemoglobin A1C: 9

## 2017-10-22 MED ORDER — BASAGLAR KWIKPEN 100 UNIT/ML ~~LOC~~ SOPN: 25.0000 [IU] | PEN_INJECTOR | Freq: Every day | SUBCUTANEOUS | 3 refills | Status: DC

## 2017-10-22 MED ORDER — ATORVASTATIN CALCIUM 40 MG PO TABS: 40.0000 mg | ORAL_TABLET | Freq: Every day | ORAL | 3 refills | Status: DC

## 2017-10-22 MED ORDER — GLIPIZIDE 10 MG PO TABS: ORAL_TABLET | ORAL | 3 refills | Status: DC

## 2017-10-22 MED ORDER — LISINOPRIL 40 MG PO TABS: 40.0000 mg | ORAL_TABLET | Freq: Every day | ORAL | 3 refills | Status: DC

## 2017-10-22 MED FILL — BASAGLAR 100 UNIT/ML KWIKPE: 100 | 31 days supply | Qty: 9 | Fill #0

## 2017-10-22 MED FILL — glipiZIDE 10 MG TABS: 10 | 30 days supply | Qty: 60 | Fill #0

## 2017-10-22 MED FILL — LISINOPRIL 40 MG TAB: 40 | 30 days supply | Qty: 30 | Fill #0

## 2017-10-22 MED FILL — ATORVASTATIN 40 MG TABLET: 40 | 30 days supply | Qty: 30 | Fill #0

## 2017-10-22 NOTE — Patient Instructions (Signed)
Diabetes Mellitus and Nutrition When you have diabetes (diabetes mellitus), it is very important to have healthy eating habits because your blood sugar (glucose) levels are greatly affected by what you eat and drink. Eating healthy foods in the appropriate amounts, at about the same times every day, can help you:  Control your blood glucose.  Lower your risk of heart disease.  Improve your blood pressure.  Reach or maintain a healthy weight.  Every person with diabetes is different, and each person has different needs for a meal plan. Your health care provider may recommend that you work with a diet and nutrition specialist (dietitian) to make a meal plan that is best for you. Your meal plan may vary depending on factors such as:  The calories you need.  The medicines you take.  Your weight.  Your blood glucose, blood pressure, and cholesterol levels.  Your activity level.  Other health conditions you have, such as heart or kidney disease.  How do carbohydrates affect me? Carbohydrates affect your blood glucose level more than any other type of food. Eating carbohydrates naturally increases the amount of glucose in your blood. Carbohydrate counting is a method for keeping track of how many carbohydrates you eat. Counting carbohydrates is important to keep your blood glucose at a healthy level, especially if you use insulin or take certain oral diabetes medicines. It is important to know how many carbohydrates you can safely have in each meal. This is different for every person. Your dietitian can help you calculate how many carbohydrates you should have at each meal and for snack. Foods that contain carbohydrates include:  Bread, cereal, rice, pasta, and crackers.  Potatoes and corn.  Peas, beans, and lentils.  Milk and yogurt.  Fruit and juice.  Desserts, such as cakes, cookies, ice cream, and candy.  How does alcohol affect me? Alcohol can cause a sudden decrease in blood  glucose (hypoglycemia), especially if you use insulin or take certain oral diabetes medicines. Hypoglycemia can be a life-threatening condition. Symptoms of hypoglycemia (sleepiness, dizziness, and confusion) are similar to symptoms of having too much alcohol. If your health care provider says that alcohol is safe for you, follow these guidelines:  Limit alcohol intake to no more than 1 drink per day for nonpregnant women and 2 drinks per day for men. One drink equals 12 oz of beer, 5 oz of wine, or 1 oz of hard liquor.  Do not drink on an empty stomach.  Keep yourself hydrated with water, diet soda, or unsweetened iced tea.  Keep in mind that regular soda, juice, and other mixers may contain a lot of sugar and must be counted as carbohydrates.  What are tips for following this plan? Reading food labels  Start by checking the serving size on the label. The amount of calories, carbohydrates, fats, and other nutrients listed on the label are based on one serving of the food. Many foods contain more than one serving per package.  Check the total grams (g) of carbohydrates in one serving. You can calculate the number of servings of carbohydrates in one serving by dividing the total carbohydrates by 15. For example, if a food has 30 g of total carbohydrates, it would be equal to 2 servings of carbohydrates.  Check the number of grams (g) of saturated and trans fats in one serving. Choose foods that have low or no amount of these fats.  Check the number of milligrams (mg) of sodium in one serving. Most people   should limit total sodium intake to less than 2,300 mg per day.  Always check the nutrition information of foods labeled as "low-fat" or "nonfat". These foods may be higher in added sugar or refined carbohydrates and should be avoided.  Talk to your dietitian to identify your daily goals for nutrients listed on the label. Shopping  Avoid buying canned, premade, or processed foods. These  foods tend to be high in fat, sodium, and added sugar.  Shop around the outside edge of the grocery store. This includes fresh fruits and vegetables, bulk grains, fresh meats, and fresh dairy. Cooking  Use low-heat cooking methods, such as baking, instead of high-heat cooking methods like deep frying.  Cook using healthy oils, such as olive, canola, or sunflower oil.  Avoid cooking with butter, cream, or high-fat meats. Meal planning  Eat meals and snacks regularly, preferably at the same times every day. Avoid going long periods of time without eating.  Eat foods high in fiber, such as fresh fruits, vegetables, beans, and whole grains. Talk to your dietitian about how many servings of carbohydrates you can eat at each meal.  Eat 4-6 ounces of lean protein each day, such as lean meat, chicken, fish, eggs, or tofu. 1 ounce is equal to 1 ounce of meat, chicken, or fish, 1 egg, or 1/4 cup of tofu.  Eat some foods each day that contain healthy fats, such as avocado, nuts, seeds, and fish. Lifestyle   Check your blood glucose regularly.  Exercise at least 30 minutes 5 or more days each week, or as told by your health care provider.  Take medicines as told by your health care provider.  Do not use any products that contain nicotine or tobacco, such as cigarettes and e-cigarettes. If you need help quitting, ask your health care provider.  Work with a counselor or diabetes educator to identify strategies to manage stress and any emotional and social challenges. What are some questions to ask my health care provider?  Do I need to meet with a diabetes educator?  Do I need to meet with a dietitian?  What number can I call if I have questions?  When are the best times to check my blood glucose? Where to find more information:  American Diabetes Association: diabetes.org/food-and-fitness/food  Academy of Nutrition and Dietetics:  www.eatright.org/resources/health/diseases-and-conditions/diabetes  National Institute of Diabetes and Digestive and Kidney Diseases (NIH): www.niddk.nih.gov/health-information/diabetes/overview/diet-eating-physical-activity Summary  A healthy meal plan will help you control your blood glucose and maintain a healthy lifestyle.  Working with a diet and nutrition specialist (dietitian) can help you make a meal plan that is best for you.  Keep in mind that carbohydrates and alcohol have immediate effects on your blood glucose levels. It is important to count carbohydrates and to use alcohol carefully. This information is not intended to replace advice given to you by your health care provider. Make sure you discuss any questions you have with your health care provider. Document Released: 06/15/2005 Document Revised: 10/23/2016 Document Reviewed: 10/23/2016 Elsevier Interactive Patient Education  2018 Else[employer] Inc.  

## 2017-10-22 NOTE — Progress Notes (Signed)
Subjective:  Patient ID: Sheila Bishop, female    DOB: November 29, 1970  Age: 47 y.o. MRN: [id number]  CC: Diabetes and Hypertension  HPI Sheila Bishop is a 47 year old female with a PMH of type 2 diabetes mellitus, HTN, and hyperlipidemia who presents today for a follow-up visit. She was recently seen on 10/16/17 for a URI and states that she is feeling better.  She reports not exercising, but states that she is trying to follow a low sodium, low carb diet. She reports avoiding sodas and fried foods. Morning blood glucose ranges anywhere from 85-200's.  She reports adherence to medication. She has been on Levemir 20 units twice daily and glipizide but her diabetes has still been uncontrolled. She now states that her insurance does not cover Levemir and she cannot afford to continue to pay for it. Denies hypoglycemia, tingling, or numbness in extremities. Up-to-date on annual eye and foot exams.  Takes her antihypertensives and tolerates it well. Blood pressure has been stable at home. Denies chest pain, SOB, or palpitations.   Past Medical History:  Diagnosis Date  . Breast abscess Nov 2012   left  . Depression 2007  . Diabetes mellitus 2009  . Headache(784.0)   . Hypertension 2009  . Obesity   . Wears glasses    Past Surgical History:  Procedure Laterality Date  . BREAST BIOPSY  08/07/2011   Procedure: BREAST BIOPSY;  Surgeon: Joyice Faster. Cornett, MD;  Location: [facility];  Service: General;  Laterality: Left;  BIOPSY BREAST LEFT SIDE  . CESAREAN SECTION  2002   No Known Allergies  Outpatient Medications Prior to Visit  Medication Sig Dispense Refill  . benzonatate (TESSALON) 100 MG capsule Take 1 capsule (100 mg total) by mouth 2 (two) times daily as needed for cough. 20 capsule 0  . Blood Glucose Monitoring Suppl (ONE TOUCH ULTRA 2) w/Device KIT Use 3 times daily before meals 1 each 0  . glucose blood test strip Use as instructed 3 times daily before meals 100 each 12  .  Insulin Pen Needle 31G X 5 MM MISC 1 each by Does not apply route at bedtime. 30 each 5  . Lancets (ONETOUCH ULTRASOFT) lancets Use as instructed 3 times daily before meals 100 each 12  . loperamide (IMODIUM A-D) 2 MG capsule 2 mg Q 6 hrs PRN 12 capsule 0  . atorvastatin (LIPITOR) 40 MG tablet Take 1 tablet (40 mg total) by mouth daily. 30 tablet 3  . glipiZIDE (GLUCOTROL) 10 MG tablet TAKE 1 TABLET BY MOUTH 2 TIMES DAILY BEFORE A MEAL. 60 tablet 3  . Insulin Detemir (LEVEMIR FLEXTOUCH) 100 UNIT/ML Pen Inject 20 Units into the skin 2 (two) times daily. 5 pen 3  . lisinopril (PRINIVIL,ZESTRIL) 40 MG tablet Take 1 tablet (40 mg total) by mouth daily. 30 tablet 3  . ibuprofen (ADVIL,MOTRIN) 800 MG tablet Take 1 tablet (800 mg total) by mouth 3 (three) times daily. (Patient not taking: Reported on 01/10/2017) 21 tablet 0   No facility-administered medications prior to visit.     ROS Review of Systems  Constitutional: Negative for activity change, fatigue and fever.  HENT: Negative.   Eyes: Negative for visual disturbance.  Respiratory: Negative for cough and shortness of breath.   Cardiovascular: Negative for chest pain and palpitations.  Gastrointestinal: Negative for abdominal distention and abdominal pain.  Endocrine: Negative for cold intolerance and heat intolerance.  Genitourinary: Negative for frequency.  Musculoskeletal: Negative.   Neurological: Negative for  dizziness, weakness, light-headedness and numbness.  Psychiatric/Behavioral: Negative.     Objective:  BP 127/83   Pulse 87   Temp 97.7 F (36.5 C) (Oral)   Ht '5\' 6"'$  (1.676 m)   Wt 227 lb 9.6 oz (103.2 kg)   LMP 10/17/2017   SpO2 99%   BMI 36.74 kg/m   BP/Weight 10/22/2017 10/16/2017 8/46/9629  Systolic BP 528 413 244  Diastolic BP 83 90 82  Wt. (Lbs) 227.6 231 222.8  BMI 36.74 37.28 35.96   Lab Results  Component Value Date   HGBA1C 9.0 10/22/2017   Lipid Panel     Component Value Date/Time   CHOL 153  11/01/2016 1608   TRIG 90 11/01/2016 1608   HDL 39 (L) 11/01/2016 1608   CHOLHDL 3.9 11/01/2016 1608   VLDL 18 06/16/2010 2054   LDLCALC 144 (H) 06/16/2010 2054   LDLDIRECT 131 (H) 04/21/2010 2016   Lab Results  Component Value Date   POCGLU 100 (A) 10/22/2017   POCGLU 92 10/16/2017   POCGLU 101 (A) 05/11/2017   Physical Exam  Constitutional: She is oriented to person, place, and time. She appears well-developed and well-nourished. No distress.  HENT:  Head: Normocephalic.  Mouth/Throat: Oropharynx is clear and moist.  Neck: Normal range of motion. Neck supple.  Cardiovascular: Normal rate, regular rhythm, normal heart sounds and intact distal pulses.  Pulmonary/Chest: Effort normal and breath sounds normal. No respiratory distress.  Abdominal: Soft. Bowel sounds are normal. She exhibits no distension. There is no tenderness.  Musculoskeletal: Normal range of motion. She exhibits no edema.  Neurological: She is alert and oriented to person, place, and time.  Skin: Skin is warm and dry.  Psychiatric: She has a normal mood and affect. Her behavior is normal.    Assessment & Plan:   1. Essential hypertension Controlled - CMP14+EGFR; Future - lisinopril (PRINIVIL,ZESTRIL) 40 MG tablet; Take 1 tablet (40 mg total) by mouth daily.  Dispense: 30 tablet; Refill: 3 We have discussed target BP range and blood pressure goal. I have advised patient to check BP regularly and to call us back or report to clinic if the numbers are consistently higher than 140/90. We discussed the importance of compliance with medical therapy and DASH diet recommended, consequences of uncontrolled hypertension discussed.  - continue current BP medications  2. Uncontrolled type 2 diabetes mellitus without complication, with long-term current use of insulin ([facility]) - POCT glucose (manual entry) - POCT glycosylated hemoglobin (Hb A1C) - Microalbumin/Creatinine Ratio, Urine; Future - glipiZIDE (GLUCOTROL) 10 MG  tablet; TAKE 1 TABLET BY MOUTH 2 TIMES DAILY BEFORE A MEAL.  Dispense: 60 tablet; Refill: 3 Levemir changed to [place] because it is covered by her insurance: -Insulin Glargine (BASAGLAR KWIKPEN) 100 UNIT/ML SOPN; Inject 0.25 mLs (25 Units total) into the skin at bedtime.  Dispense: 3 pen; Refill: 3.  3. Dyslipidemia associated with type 2 diabetes mellitus ([place]) - Lipid panel; Future - atorvastatin (LIPITOR) 40 MG tablet; Take 1 tablet (40 mg total) by mouth daily.  Dispense: 30 tablet; Refill: 3   Meds ordered this encounter  Medications  . lisinopril (PRINIVIL,ZESTRIL) 40 MG tablet    Sig: Take 1 tablet (40 mg total) by mouth daily.    Dispense:  30 tablet    Refill:  3  . glipiZIDE (GLUCOTROL) 10 MG tablet    Sig: TAKE 1 TABLET BY MOUTH 2 TIMES DAILY BEFORE A MEAL.    Dispense:  60 tablet    Refill:  3  .  atorvastatin (LIPITOR) 40 MG tablet    Sig: Take 1 tablet (40 mg total) by mouth daily.    Dispense:  30 tablet    Refill:  3  . Insulin Glargine (BASAGLAR KWIKPEN) 100 UNIT/ML SOPN    Sig: Inject 0.25 mLs (25 Units total) into the skin at bedtime.    Dispense:  3 pen    Refill:  3    Discontinue Levemir    Follow-up: Return in about 1 month (around 11/22/2017) for Pap smear.

## 2017-10-24 ENCOUNTER — Other Ambulatory Visit: Payer: 59

## 2017-11-07 ENCOUNTER — Ambulatory Visit: Payer: Self-pay | Admitting: Surgery

## 2017-11-09 ENCOUNTER — Telehealth: Payer: Self-pay | Admitting: General Practice

## 2017-11-09 NOTE — Telephone Encounter (Signed)
Left a voicemail for the patient to call our office, patient no showed appointment. Please r/s if the patient calls back.

## 2017-11-13 NOTE — Telephone Encounter (Signed)
Left another message for the patient to call the office. °

## 2017-11-14 ENCOUNTER — Encounter: Payer: Self-pay | Admitting: General Practice

## 2017-11-14 NOTE — Telephone Encounter (Signed)
Left another message for the patient to contact our office I have also mailed a letter.

## 2017-11-26 MED FILL — glipiZIDE 10 MG TABS: 10 | 30 days supply | Qty: 60 | Fill #1

## 2017-11-26 MED FILL — ATORVASTATIN 40 MG TABLET: 40 | 30 days supply | Qty: 30 | Fill #1

## 2017-11-26 MED FILL — LISINOPRIL 40 MG TAB: 40 | 30 days supply | Qty: 30 | Fill #1

## 2017-11-29 IMAGING — CT CT NECK W/ CM
4 of 5 series · 15 of 33 positions shown, 17 images · IV contrast (Omni 300)
Comparison: None.

CLINICAL DATA: RIGHT neck pain and swelling beginning yesterday.
History of hypertension and diabetes.

EXAM:
CT NECK WITH CONTRAST
TECHNIQUE: Multidetector CT imaging of the neck was performed using the
standard protocol following the bolus administration of intravenous
contrast.
CONTRAST:  75 cc Y95IOQ-STT IOPAMIDOL (Y95IOQ-STT) INJECTION 61%

[Series 2: neck 2.0 st · axial · 0.54mm/px · z∈[+905,+1043]mm · 4 of 116 slices shown, 5 images (1 of 3)]
[im 24/116  soft-tissue]
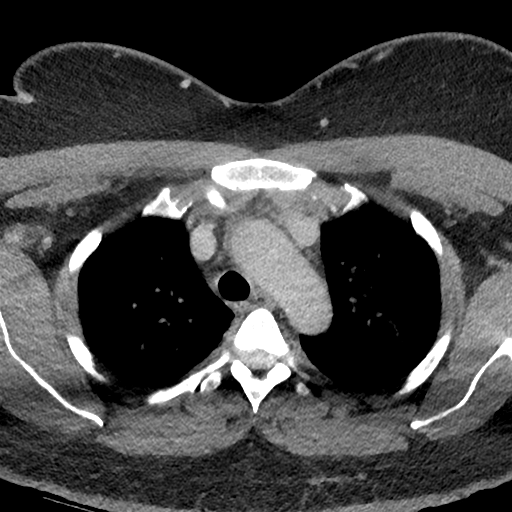
[im 24/116  bone]
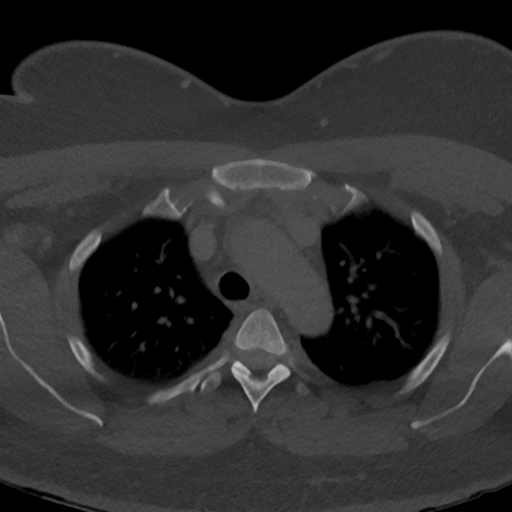
[im 47/116  bone]
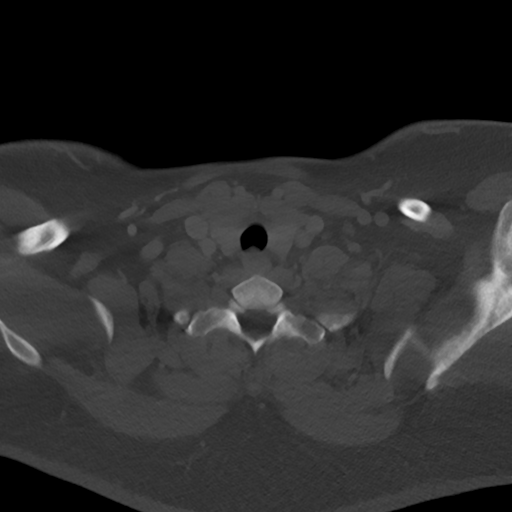
[im 70/116  bone]
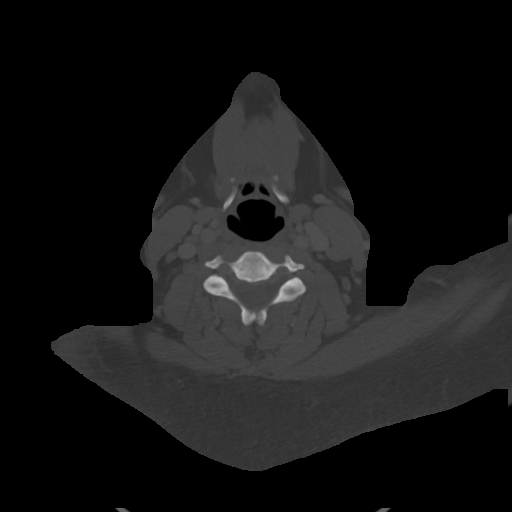
[im 93/116  bone]
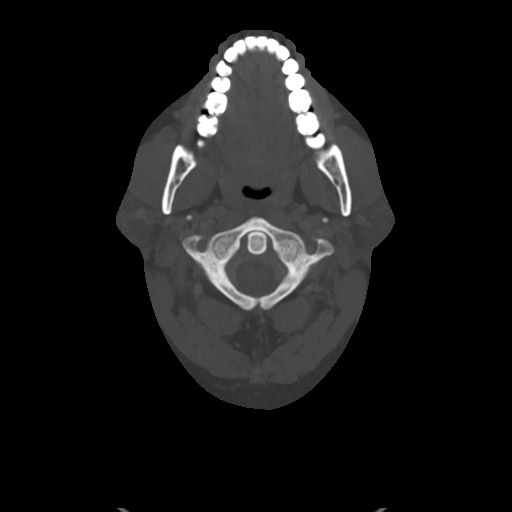

[Series 4: neck 2.0 st · sagittal · 0.47mm/px · 5 of 101 slices shown, 6 images (2 of 3)]
[im 34/101  bone]
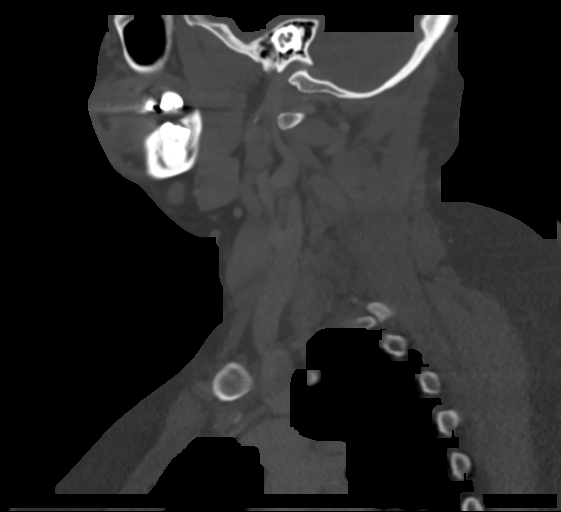
[im 42/101  bone]
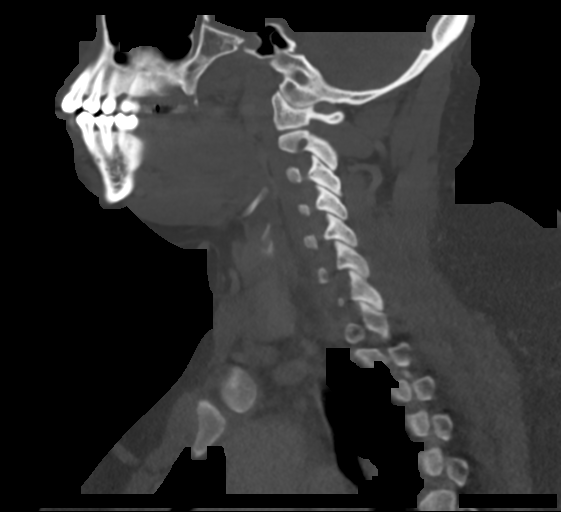
[im 51/101  soft-tissue]
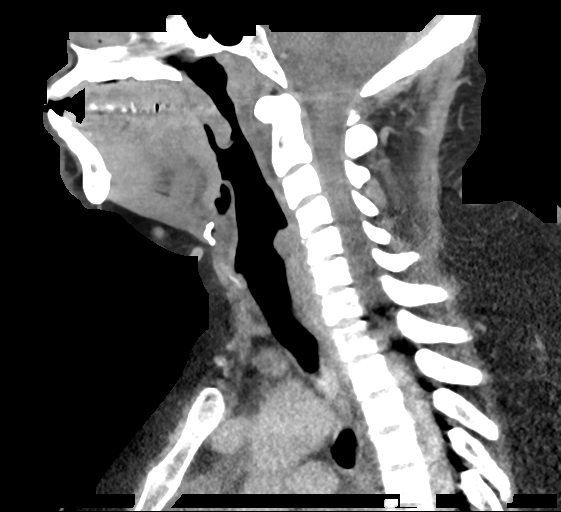
[im 51/101  bone]
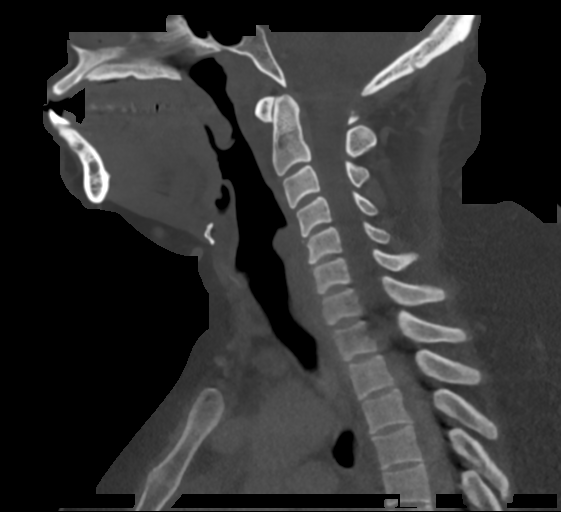
[im 59/101  bone]
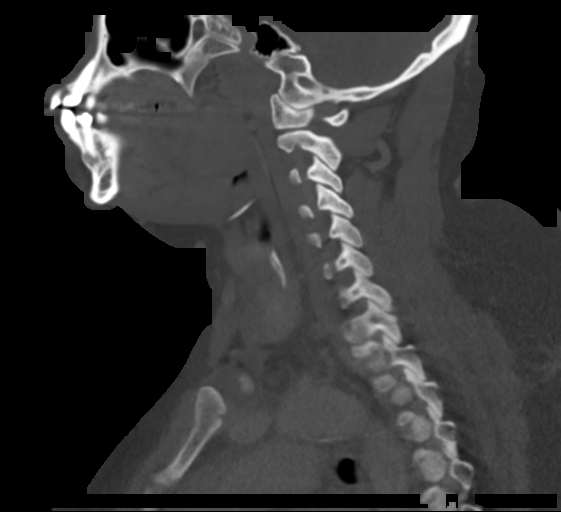
[im 67/101  bone]
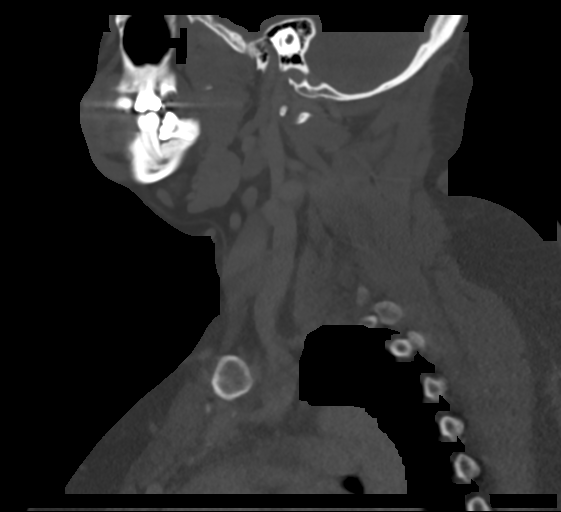

[Series 5: neck 2.0 st · coronal · 0.45mm/px · 3 of 123 slices shown (3 of 3)]
[im 25/123  bone]
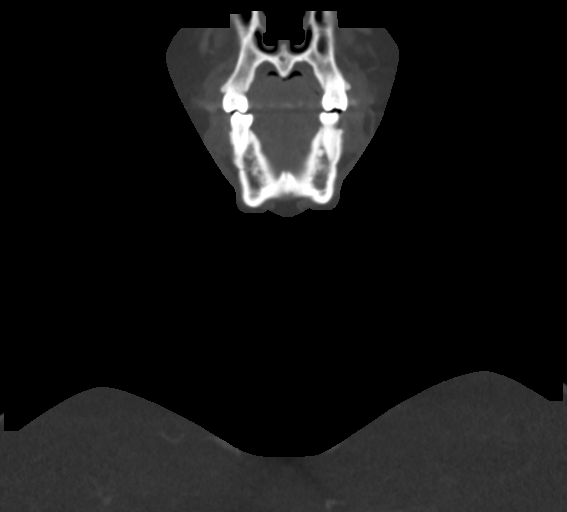
[im 49/123  bone]
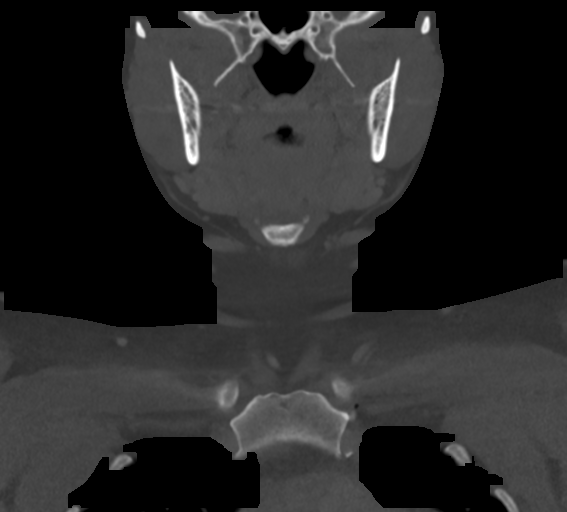
[im 74/123  bone]
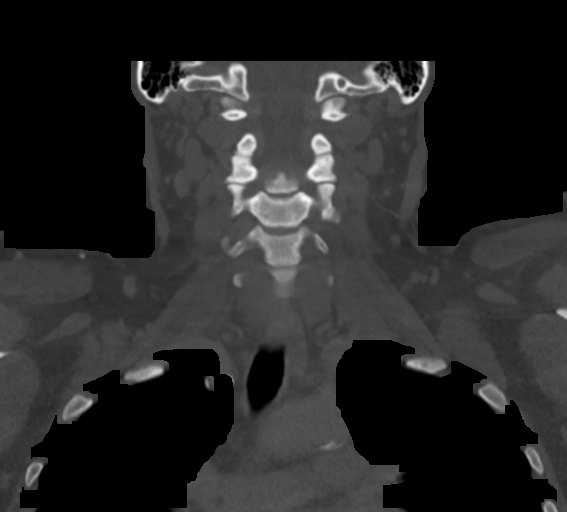

[Series 6: neck 2.0 st orthogonal · axial · 0.39mm/px · z∈[+919,+1033]mm · 3 of 115 slices shown]
[im 29/115  bone]
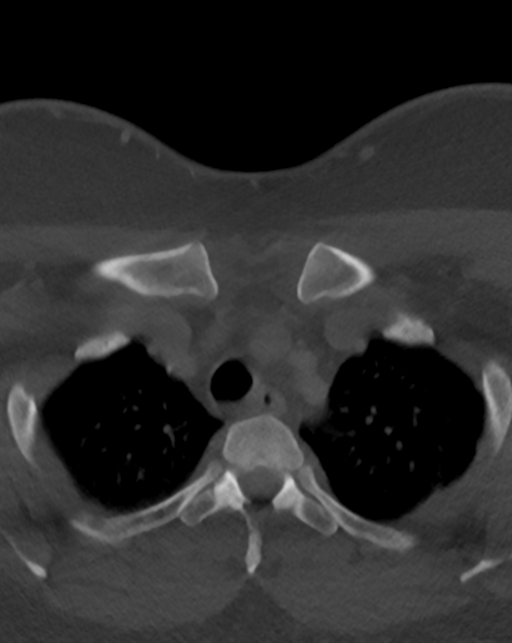
[im 58/115  bone]
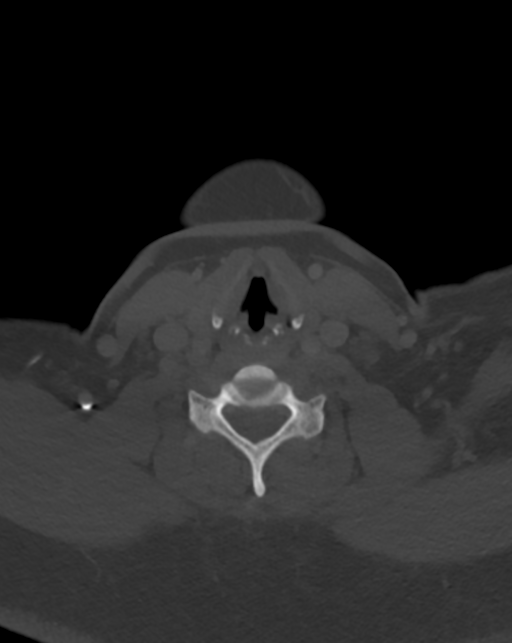
[im 86/115  bone]
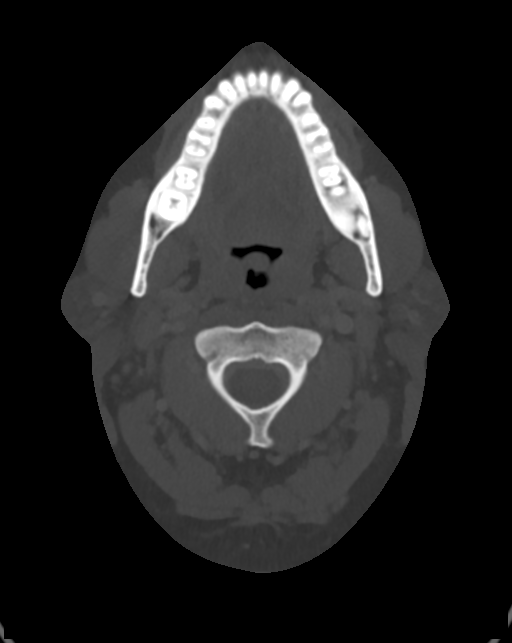

[15 of 33 positions shown; findings below may reference images not displayed]

FINDINGS: Pharynx and larynx: Normal.

Salivary glands: Normal.

Thyroid: Normal.

Lymph nodes: Scattered prominent though not pathologically enlarged
lymph nodes throughout bilateral neck chains. However, somewhat
matted appearing nodal conglomeration LEFT level 5 B measuring up to
13 x 12 mm.

Vascular: Mild intimal thickening and early calcific atherosclerosis
RIGHT carotid bulb.

Limited intracranial: Normal. Possible empty sella partially imaged.

Mastoids and visualized paranasal sinuses: Small LEFT maxillary
mucosal retention cysts without paranasal sinus air-fluid levels.
Imaged mastoid air cells are well aerated.

Skeleton: Scattered dental caries. Expansile ground-glass density
and mandible symphysis suggesting fibrous dysplasia. Subcentimeter
probable bone island LEFT mandible ramus versus accessory unerupted
tooth.

Upper chest: Lung apices are clear. No superior mediastinal
lymphadenopathy.
IMPRESSION: Mild lymphadenopathy throughout bilateral neck: Though these may be
reactive, lymphoproliferative disorder can have a similar
appearance.

## 2017-11-30 ENCOUNTER — Telehealth: Payer: Self-pay

## 2017-11-30 NOTE — Telephone Encounter (Signed)
Patient was called and patient states that the bump on right leg has already burst.

## 2017-12-05 MED FILL — BASAGLAR 100 UNIT/ML KWIKPE: 100 | 31 days supply | Qty: 9 | Fill #1

## 2017-12-08 ENCOUNTER — Other Ambulatory Visit: Payer: Self-pay

## 2017-12-08 ENCOUNTER — Emergency Department (HOSPITAL_COMMUNITY): Payer: 59

## 2017-12-08 ENCOUNTER — Encounter (HOSPITAL_COMMUNITY): Payer: Self-pay

## 2017-12-08 ENCOUNTER — Emergency Department (HOSPITAL_COMMUNITY)
Admission: EM | Admit: 2017-12-08 | Discharge: 2017-12-09 | Disposition: A | Discharge status: Home / Self Care | Payer: 59 | Attending: Emergency Medicine | Admitting: Emergency Medicine

## 2017-12-08 DIAGNOSIS — I10 Essential (primary) hypertension: Secondary | ICD-10-CM | POA: Insufficient documentation

## 2017-12-08 DIAGNOSIS — E119 Type 2 diabetes mellitus without complications: Secondary | ICD-10-CM | POA: Diagnosis not present

## 2017-12-08 DIAGNOSIS — R0789 Other chest pain: Secondary | ICD-10-CM | POA: Diagnosis not present

## 2017-12-08 DIAGNOSIS — Z87891 Personal history of nicotine dependence: Secondary | ICD-10-CM | POA: Insufficient documentation

## 2017-12-08 DIAGNOSIS — M62830 Muscle spasm of back: Secondary | ICD-10-CM | POA: Diagnosis not present

## 2017-12-08 DIAGNOSIS — Z794 Long term (current) use of insulin: Secondary | ICD-10-CM | POA: Diagnosis not present

## 2017-12-08 DIAGNOSIS — Z79899 Other long term (current) drug therapy: Secondary | ICD-10-CM | POA: Insufficient documentation

## 2017-12-08 DIAGNOSIS — R079 Chest pain, unspecified: Secondary | ICD-10-CM

## 2017-12-08 LAB — BASIC METABOLIC PANEL
Anion gap: 7 (ref 5–15)
BUN: 21 mg/dL — AB (ref 6–20)
CALCIUM: 9.2 mg/dL (ref 8.9–10.3)
CHLORIDE: 105 mmol/L (ref 101–111)
CO2: 26 mmol/L (ref 22–32)
CREATININE: 0.69 mg/dL (ref 0.44–1.00)
GFR calc non Af Amer: >60 mL/min (ref >60)
Glucose, Bld: 134 mg/dL — ABNORMAL HIGH (ref 65–99)
Potassium: 3.6 mmol/L (ref 3.5–5.1)
SODIUM: 138 mmol/L (ref 135–145)

## 2017-12-08 LAB — CBC
HCT: 30.8 % — ABNORMAL LOW (ref 36.0–46.0)
Hemoglobin: 10.2 g/dL — ABNORMAL LOW (ref 12.0–15.0)
MCH: 26.1 pg (ref 26.0–34.0)
MCHC: 33.1 g/dL (ref 30.0–36.0)
MCV: 78.8 fL (ref 78.0–100.0)
PLATELETS: 330 10*3/uL (ref 150–400)
RBC: 3.91 MIL/uL (ref 3.87–5.11)
RDW: 13.9 % (ref 11.5–15.5)
WBC: 14.6 10*3/uL — AB (ref 4.0–10.5)

## 2017-12-08 LAB — I-STAT TROPONIN, ED: TROPONIN I, POC: 0 ng/mL (ref 0.00–0.08)

## 2017-12-08 LAB — I-STAT BETA HCG BLOOD, ED (MC, WL, AP ONLY)

## 2017-12-08 MED ORDER — METHOCARBAMOL 500 MG PO TABS
500.0000 mg | ORAL_TABLET | Freq: Once | ORAL | Status: AC
  Administered 2017-12-08: 500 mg via ORAL
  Filled 2017-12-08: qty 1

## 2017-12-08 MED ORDER — METHOCARBAMOL 500 MG PO TABS: 500.0000 mg | ORAL_TABLET | Freq: Two times a day (BID) | ORAL | 0 refills | Status: DC

## 2017-12-08 MED ORDER — IBUPROFEN 800 MG PO TABS
800.0000 mg | ORAL_TABLET | Freq: Once | ORAL | Status: AC
Start: 2017-12-08 — End: 2017-12-08
  Administered 2017-12-08: 800 mg via ORAL
  Filled 2017-12-08: qty 1

## 2017-12-08 MED ORDER — IBUPROFEN 800 MG PO TABS: 800.0000 mg | ORAL_TABLET | Freq: Three times a day (TID) | ORAL | 0 refills | Status: DC

## 2017-12-08 NOTE — ED Provider Notes (Signed)
[facility] DEPT Provider Note   CSN: [id number] Arrival date & time: 12/08/17  2112     History   Chief Complaint Chief Complaint  Patient presents with  . Chest Pain    HPI Sheila Bishop is a 47 y.o. female.  The history is provided by the patient and medical records.     47 year old female with history of depression, diabetes, hypertension, hyperlipidemia, presenting to the ED with chest and back pain.  States she noticed it this morning upon waking, has been constant all day.  States it feels like a pressure intermixed with soreness in her central chest and back.  States pain does not seem to be radiating through but rather seems in 2 different areas.  She denies any associated shortness of breath, diaphoresis, nausea, vomiting, dizziness, or lightheadedness.  She has no known cardiac history.  She is not a smoker.  She does have family cardiac history.  She did take her home medications today but has not taken anything additional for pain.  Past Medical History:  Diagnosis Date  . Breast abscess Nov 2012   left  . Depression 2007  . Diabetes mellitus 2009  . Headache(784.0)   . Hypertension 2009  . Obesity   . Wears glasses     Patient Active Problem List   Diagnosis Date Noted  . Non-intractable vomiting with nausea 01/04/2017  . Acute non-recurrent frontal sinusitis 01/04/2017  . Irregular menses 02/10/2015  . Dyslipidemia associated with type 2 diabetes mellitus ([place]) 02/10/2015  . Diabetes type 2, uncontrolled ([place]) 11/26/2014  . Pain due to onychomycosis of toenail 11/26/2014  . Hypertension 11/01/2014    Past Surgical History:  Procedure Laterality Date  . BREAST BIOPSY  08/07/2011   Procedure: BREAST BIOPSY;  Surgeon: Joyice Faster. Cornett, MD;  Location: [facility];  Service: General;  Laterality: Left;  BIOPSY BREAST LEFT SIDE  . CESAREAN SECTION  2002    OB History    Gravida Para Term Preterm AB Living   '4 3 3 '$ 0 1 4   SAB TAB Ectopic Multiple Live Births   1     1         Home Medications    Prior to Admission medications   Medication Sig Start Date End Date Taking? Authorizing Provider  atorvastatin (LIPITOR) 40 MG tablet Take 1 tablet (40 mg total) by mouth daily. 10/22/17   Charlott Rakes, MD  benzonatate (TESSALON) 100 MG capsule Take 1 capsule (100 mg total) by mouth 2 (two) times daily as needed for cough. 10/16/17   Ladell Pier, MD  Blood Glucose Monitoring Suppl (ONE TOUCH ULTRA 2) w/Device KIT Use 3 times daily before meals 05/11/17   Newlin, Enobong, MD  glipiZIDE (GLUCOTROL) 10 MG tablet TAKE 1 TABLET BY MOUTH 2 TIMES DAILY BEFORE A MEAL. 10/22/17   Charlott Rakes, MD  glucose blood test strip Use as instructed 3 times daily before meals 05/11/17   Charlott Rakes, MD  ibuprofen (ADVIL,MOTRIN) 800 MG tablet Take 1 tablet (800 mg total) by mouth 3 (three) times daily. Patient not taking: Reported on 01/10/2017 02/09/16   Sam, Olivia Canter, PA-C  Insulin Glargine [facility]) 100 UNIT/ML SOPN Inject 0.25 mLs (25 Units total) into the skin at bedtime. 10/22/17   Charlott Rakes, MD  Insulin Pen Needle 31G X 5 MM MISC 1 each by Does not apply route at bedtime. 02/06/17   Charlott Rakes, MD  Lancets (ONETOUCH ULTRASOFT) lancets Use as  instructed 3 times daily before meals 05/11/17   Charlott Rakes, MD  lisinopril (PRINIVIL,ZESTRIL) 40 MG tablet Take 1 tablet (40 mg total) by mouth daily. 10/22/17   Charlott Rakes, MD  loperamide (IMODIUM A-D) 2 MG capsule 2 mg Q 6 hrs PRN 10/16/17   Ladell Pier, MD    Family History Family History  Problem Relation Age of Onset  . Hyperlipidemia Mother   . Hypertension Mother   . Heart disease Mother     Social History Social History   Tobacco Use  . Smoking status: Former Smoker    Last attempt to quit: 12/31/2007    Years since quitting: 9.9  . Smokeless tobacco: Never Used  Substance Use Topics  . Alcohol use: No  . Drug use: No      Allergies   Patient has no known allergies.   Review of Systems Review of Systems  Cardiovascular: Positive for chest pain.  Musculoskeletal: Positive for back pain.  All other systems reviewed and are negative.    Physical Exam Updated Vital Signs BP 126/82 (BP Location: Left Arm)   Pulse 83   Temp 98.1 F (36.7 C) (Oral)   Resp 16   Ht '5\' 8"'$  (1.727 m)   Wt 103 kg (227 lb)   LMP 12/04/2017   SpO2 100%   BMI 34.52 kg/m   Physical Exam  Constitutional: She is oriented to person, place, and time. She appears well-developed and well-nourished.  HENT:  Head: Normocephalic and atraumatic.  Mouth/Throat: Oropharynx is clear and moist.  Eyes: Conjunctivae and EOM are normal. Pupils are equal, round, and reactive to light.  Neck: Normal range of motion.  Cardiovascular: Normal rate, regular rhythm and normal heart sounds.  Pulmonary/Chest: Effort normal and breath sounds normal. She has no decreased breath sounds. She has no wheezes.  Lung sounds are clear  Abdominal: Soft. Bowel sounds are normal.  Musculoskeletal: Normal range of motion.  Significant spasm of the upper and mid back, this is locally tender to palpation, no midline deformity or step-off of the spine, normal strength and sensation of all 4 extremities  Neurological: She is alert and oriented to person, place, and time.  Skin: Skin is warm and dry.  Psychiatric: She has a normal mood and affect.  Nursing note and vitals reviewed.    ED Treatments / Results  Labs (all labs ordered are listed, but only abnormal results are displayed) Labs Reviewed  BASIC METABOLIC PANEL - Abnormal; Notable for the following components:      Result Value   Glucose, Bld 134 (*)    BUN 21 (*)    All other components within normal limits  CBC - Abnormal; Notable for the following components:   WBC 14.6 (*)    Hemoglobin 10.2 (*)    HCT 30.8 (*)    All other components within normal limits  I-STAT TROPONIN, ED   I-STAT BETA HCG BLOOD, ED ([facility], [facility], [facility] ONLY)    EKG  EKG Interpretation  Date/Time:  Saturday December 08 2017 21:20:01 EST Ventricular Rate:  94 PR Interval:    QRS Duration: 87 QT Interval:  339 QTC Calculation: 424 R Axis:   46 Text Interpretation:  Sinus rhythm Probable left atrial enlargement since last tracing no significant change Confirmed by Daleen Bo [phone]) on 12/08/2017 9:44:25 PM       Radiology Dg Chest 2 View  Result Date: 12/08/2017 CLINICAL DATA:  Central chest pain EXAM: CHEST - 2 VIEW COMPARISON:  10/12/2016 FINDINGS: The heart size and mediastinal contours are within normal limits. Both lungs are clear. The visualized skeletal structures are unremarkable. IMPRESSION: No active cardiopulmonary disease. Electronically Signed   By: Donavan Foil M.D.   On: 12/08/2017 20:48    Procedures Procedures (including critical care time)  Medications Ordered in ED Medications - No data to display   Initial Impression / Assessment and Plan / ED Course  I have reviewed the triage vital signs and the nursing notes.  Pertinent labs & imaging results that were available during my care of the patient were reviewed by me and considered in my medical decision making (see chart for details).  47 year old female presenting to the ED with chest and back pain.  Has been constant all day.  She is afebrile and nontoxic.  No acute distress.  EKG is nonischemic.  Screening labs and chest x-ray are overall reassuring.  On exam she does have muscular tenderness of the upper and mid back without any deformity of the spine.  She has no focal neurologic deficits.  Lower suspicion for ACS given negative work-up after ongoing symptoms.  No tachycardia or hypoxia to suggest PE.  No widened mediastinum on CXR to suggest dissection.  Feel this is likely MSK.  Will treat with anti-inflammatories and muscle relaxer.  Can follow-up closely with PCP.   Discussed plan with patient, she acknowledged  understanding and agreed with plan of care.  Return precautions given for new or worsening symptoms.  Final Clinical Impressions(s) / ED Diagnoses   Final diagnoses:  Chest pain in adult  Back spasm    ED Discharge Orders        Ordered    ibuprofen (ADVIL,MOTRIN) 800 MG tablet  3 times daily     12/08/17 2347    methocarbamol (ROBAXIN) 500 MG tablet  2 times daily     12/08/17 2347       Larene Pickett, PA-C 12/09/17 0040    Fatima Blank, MD 12/09/17 0040

## 2017-12-08 NOTE — ED Triage Notes (Signed)
Chest pain since this am and now pain worse with movement no n/v no sob, no diaphoresis.

## 2017-12-08 NOTE — Discharge Instructions (Signed)
Take the prescribed medication as directed.  Can use heat therapy like heating pad, warm compresses, warm shower, etc on the back. Follow-up with your primary care doctor. Return to the ED for new or worsening symptoms.

## 2017-12-24 MED FILL — ATORVASTATIN 40 MG TABLET: 40 | 30 days supply | Qty: 30 | Fill #2

## 2017-12-24 MED FILL — LISINOPRIL 40 MG TAB: 40 | 30 days supply | Qty: 30 | Fill #2

## 2017-12-24 MED FILL — glipiZIDE 10 MG TABS: 10 | 30 days supply | Qty: 60 | Fill #2

## 2018-01-01 ENCOUNTER — Encounter: Payer: Self-pay | Admitting: Family Medicine

## 2018-01-01 ENCOUNTER — Ambulatory Visit: Payer: 59 | Attending: Family Medicine | Admitting: Family Medicine

## 2018-01-01 VITALS — BP 142/87 | HR 91 | Temp 97.9°F | Ht 66.0 in | Wt 230.4 lb

## 2018-01-01 DIAGNOSIS — Z79899 Other long term (current) drug therapy: Secondary | ICD-10-CM | POA: Insufficient documentation

## 2018-01-01 DIAGNOSIS — I10 Essential (primary) hypertension: Secondary | ICD-10-CM | POA: Insufficient documentation

## 2018-01-01 DIAGNOSIS — E118 Type 2 diabetes mellitus with unspecified complications: Secondary | ICD-10-CM | POA: Diagnosis not present

## 2018-01-01 DIAGNOSIS — Z794 Long term (current) use of insulin: Secondary | ICD-10-CM | POA: Insufficient documentation

## 2018-01-01 DIAGNOSIS — E785 Hyperlipidemia, unspecified: Secondary | ICD-10-CM | POA: Diagnosis not present

## 2018-01-01 DIAGNOSIS — E119 Type 2 diabetes mellitus without complications: Secondary | ICD-10-CM | POA: Diagnosis present

## 2018-01-01 DIAGNOSIS — E669 Obesity, unspecified: Secondary | ICD-10-CM | POA: Diagnosis not present

## 2018-01-01 DIAGNOSIS — Z6837 Body mass index (BMI) 37.0-37.9, adult: Secondary | ICD-10-CM | POA: Insufficient documentation

## 2018-01-01 DIAGNOSIS — R221 Localized swelling, mass and lump, neck: Secondary | ICD-10-CM | POA: Insufficient documentation

## 2018-01-01 LAB — GLUCOSE, POCT (MANUAL RESULT ENTRY): POC GLUCOSE: 202 mg/dL — AB (ref 70–99)

## 2018-01-01 MED ORDER — CYCLOBENZAPRINE HCL 10 MG PO TABS: 10.0000 mg | ORAL_TABLET | Freq: Every day | ORAL | 1 refills | Status: DC

## 2018-01-01 MED FILL — BASAGLAR 100 UNIT/ML KWIKPE: 100 | 31 days supply | Qty: 9 | Fill #2

## 2018-01-01 NOTE — Patient Instructions (Signed)
Diabetes Mellitus and Nutrition When you have diabetes (diabetes mellitus), it is very important to have healthy eating habits because your blood sugar (glucose) levels are greatly affected by what you eat and drink. Eating healthy foods in the appropriate amounts, at about the same times every day, can help you:  Control your blood glucose.  Lower your risk of heart disease.  Improve your blood pressure.  Reach or maintain a healthy weight.  Every person with diabetes is different, and each person has different needs for a meal plan. Your health care provider may recommend that you work with a diet and nutrition specialist (dietitian) to make a meal plan that is best for you. Your meal plan may vary depending on factors such as:  The calories you need.  The medicines you take.  Your weight.  Your blood glucose, blood pressure, and cholesterol levels.  Your activity level.  Other health conditions you have, such as heart or kidney disease.  How do carbohydrates affect me? Carbohydrates affect your blood glucose level more than any other type of food. Eating carbohydrates naturally increases the amount of glucose in your blood. Carbohydrate counting is a method for keeping track of how many carbohydrates you eat. Counting carbohydrates is important to keep your blood glucose at a healthy level, especially if you use insulin or take certain oral diabetes medicines. It is important to know how many carbohydrates you can safely have in each meal. This is different for every person. Your dietitian can help you calculate how many carbohydrates you should have at each meal and for snack. Foods that contain carbohydrates include:  Bread, cereal, rice, pasta, and crackers.  Potatoes and corn.  Peas, beans, and lentils.  Milk and yogurt.  Fruit and juice.  Desserts, such as cakes, cookies, ice cream, and candy.  How does alcohol affect me? Alcohol can cause a sudden decrease in blood  glucose (hypoglycemia), especially if you use insulin or take certain oral diabetes medicines. Hypoglycemia can be a life-threatening condition. Symptoms of hypoglycemia (sleepiness, dizziness, and confusion) are similar to symptoms of having too much alcohol. If your health care provider says that alcohol is safe for you, follow these guidelines:  Limit alcohol intake to no more than 1 drink per day for nonpregnant women and 2 drinks per day for men. One drink equals 12 oz of beer, 5 oz of wine, or 1 oz of hard liquor.  Do not drink on an empty stomach.  Keep yourself hydrated with water, diet soda, or unsweetened iced tea.  Keep in mind that regular soda, juice, and other mixers may contain a lot of sugar and must be counted as carbohydrates.  What are tips for following this plan? Reading food labels  Start by checking the serving size on the label. The amount of calories, carbohydrates, fats, and other nutrients listed on the label are based on one serving of the food. Many foods contain more than one serving per package.  Check the total grams (g) of carbohydrates in one serving. You can calculate the number of servings of carbohydrates in one serving by dividing the total carbohydrates by 15. For example, if a food has 30 g of total carbohydrates, it would be equal to 2 servings of carbohydrates.  Check the number of grams (g) of saturated and trans fats in one serving. Choose foods that have low or no amount of these fats.  Check the number of milligrams (mg) of sodium in one serving. Most people   should limit total sodium intake to less than 2,300 mg per day.  Always check the nutrition information of foods labeled as "low-fat" or "nonfat". These foods may be higher in added sugar or refined carbohydrates and should be avoided.  Talk to your dietitian to identify your daily goals for nutrients listed on the label. Shopping  Avoid buying canned, premade, or processed foods. These  foods tend to be high in fat, sodium, and added sugar.  Shop around the outside edge of the grocery store. This includes fresh fruits and vegetables, bulk grains, fresh meats, and fresh dairy. Cooking  Use low-heat cooking methods, such as baking, instead of high-heat cooking methods like deep frying.  Cook using healthy oils, such as olive, canola, or sunflower oil.  Avoid cooking with butter, cream, or high-fat meats. Meal planning  Eat meals and snacks regularly, preferably at the same times every day. Avoid going long periods of time without eating.  Eat foods high in fiber, such as fresh fruits, vegetables, beans, and whole grains. Talk to your dietitian about how many servings of carbohydrates you can eat at each meal.  Eat 4-6 ounces of lean protein each day, such as lean meat, chicken, fish, eggs, or tofu. 1 ounce is equal to 1 ounce of meat, chicken, or fish, 1 egg, or 1/4 cup of tofu.  Eat some foods each day that contain healthy fats, such as avocado, nuts, seeds, and fish. Lifestyle   Check your blood glucose regularly.  Exercise at least 30 minutes 5 or more days each week, or as told by your health care provider.  Take medicines as told by your health care provider.  Do not use any products that contain nicotine or tobacco, such as cigarettes and e-cigarettes. If you need help quitting, ask your health care provider.  Work with a counselor or diabetes educator to identify strategies to manage stress and any emotional and social challenges. What are some questions to ask my health care provider?  Do I need to meet with a diabetes educator?  Do I need to meet with a dietitian?  What number can I call if I have questions?  When are the best times to check my blood glucose? Where to find more information:  American Diabetes Association: diabetes.org/food-and-fitness/food  Academy of Nutrition and Dietetics:  www.eatright.org/resources/health/diseases-and-conditions/diabetes  National Institute of Diabetes and Digestive and Kidney Diseases (NIH): www.niddk.nih.gov/health-information/diabetes/overview/diet-eating-physical-activity Summary  A healthy meal plan will help you control your blood glucose and maintain a healthy lifestyle.  Working with a diet and nutrition specialist (dietitian) can help you make a meal plan that is best for you.  Keep in mind that carbohydrates and alcohol have immediate effects on your blood glucose levels. It is important to count carbohydrates and to use alcohol carefully. This information is not intended to replace advice given to you by your health care provider. Make sure you discuss any questions you have with your health care provider. Document Released: 06/15/2005 Document Revised: 10/23/2016 Document Reviewed: 10/23/2016 Elsevier Interactive Patient Education  2018 Else[employer] Inc.  

## 2018-01-01 NOTE — Progress Notes (Signed)
Subjective:  Patient ID: Sheila Bishop, female    DOB: 10-10-1970  Age: 47 y.o. MRN: [id number]  CC: Hospitalization Follow-up   HPI Sheila Bishop is a 47 year old female with a history of type 2 diabetes mellitus (A1c 9.0), hypertension, hyperlipidemia who presents today for a follow-up visit from the ED where she was seen on 12/08/17 for chest pain. Cardiac enzymes were negative, EKG unremarkable and ACS was ruled out.  Chest pain was thought to be musculoskeletal as she did have upper and mid back tenderness; she was discharged on Robaxin.  Today she informs me that she developed itching with the use of Robaxin discontinued it but continues to experience tightness in the upper part of her neck, and left side of her neck.  Past Medical History:  Diagnosis Date  . Breast abscess Nov 2012   left  . Depression 2007  . Diabetes mellitus 2009  . Headache(784.0)   . Hypertension 2009  . Obesity   . Wears glasses     Past Surgical History:  Procedure Laterality Date  . BREAST BIOPSY  08/07/2011   Procedure: BREAST BIOPSY;  Surgeon: Joyice Faster. Cornett, MD;  Location: [facility];  Service: General;  Laterality: Left;  BIOPSY BREAST LEFT SIDE  . CESAREAN SECTION  2002    No Known Allergies   Outpatient Medications Prior to Visit  Medication Sig Dispense Refill  . atorvastatin (LIPITOR) 40 MG tablet Take 1 tablet (40 mg total) by mouth daily. 30 tablet 3  . Blood Glucose Monitoring Suppl (ONE TOUCH ULTRA 2) w/Device KIT Use 3 times daily before meals 1 each 0  . glipiZIDE (GLUCOTROL) 10 MG tablet TAKE 1 TABLET BY MOUTH 2 TIMES DAILY BEFORE A MEAL. 60 tablet 3  . glucose blood test strip Use as instructed 3 times daily before meals 100 each 12  . ibuprofen (ADVIL,MOTRIN) 800 MG tablet Take 1 tablet (800 mg total) by mouth 3 (three) times daily. 21 tablet 0  . Insulin Glargine (BASAGLAR KWIKPEN) 100 UNIT/ML SOPN Inject 0.25 mLs (25 Units total) into the skin at bedtime. 3 pen 3  .  Insulin Pen Needle 31G X 5 MM MISC 1 each by Does not apply route at bedtime. 30 each 5  . Lancets (ONETOUCH ULTRASOFT) lancets Use as instructed 3 times daily before meals 100 each 12  . lisinopril (PRINIVIL,ZESTRIL) 40 MG tablet Take 1 tablet (40 mg total) by mouth daily. 30 tablet 3  . loperamide (IMODIUM A-D) 2 MG capsule 2 mg Q 6 hrs PRN 12 capsule 0  . benzonatate (TESSALON) 100 MG capsule Take 1 capsule (100 mg total) by mouth 2 (two) times daily as needed for cough. (Patient not taking: Reported on 01/01/2018) 20 capsule 0  . methocarbamol (ROBAXIN) 500 MG tablet Take 1 tablet (500 mg total) by mouth 2 (two) times daily. (Patient not taking: Reported on 01/01/2018) 20 tablet 0   No facility-administered medications prior to visit.     ROS Review of Systems  Constitutional: Negative for activity change, appetite change and fatigue.  HENT: Negative for congestion, sinus pressure and sore throat.   Eyes: Negative for visual disturbance.  Respiratory: Negative for cough, chest tightness, shortness of breath and wheezing.   Cardiovascular: Negative for chest pain and palpitations.  Gastrointestinal: Negative for abdominal distention, abdominal pain and constipation.  Endocrine: Negative for polydipsia.  Genitourinary: Negative for dysuria and frequency.  Musculoskeletal:       See hpi  Skin: Negative for rash.  Neurological: Negative for tremors, light-headedness and numbness.  Hematological: Does not bruise/bleed easily.  Psychiatric/Behavioral: Negative for agitation and behavioral problems.     Objective:  BP (!) 142/87   Pulse 91   Temp 97.9 F (36.6 C) (Oral)   Ht '5\' 6"'$  (1.676 m)   Wt 230 lb 6.4 oz (104.5 kg)   LMP 12/04/2017   SpO2 100%   BMI 37.19 kg/m   BP/Weight 01/01/2018 3/82/5053 06/08/6733  Systolic BP 193 790 -  Diastolic BP 87 80 -  Wt. (Lbs) 230.4 - 227  BMI 37.19 - 34.52      Physical Exam  Constitutional: She is oriented to person, place, and time. She  appears well-developed and well-nourished.  Neck: Normal range of motion.  Hard slightly tender posterior neck mass with tenderness on left trapezius muscle  Cardiovascular: Normal rate, normal heart sounds and intact distal pulses.  No murmur heard. Pulmonary/Chest: Effort normal and breath sounds normal. She has no wheezes. She has no rales. She exhibits no tenderness.  Abdominal: Soft. Bowel sounds are normal. She exhibits no distension and no mass. There is no tenderness.  Musculoskeletal: Normal range of motion.  Neurological: She is alert and oriented to person, place, and time.  Skin: Skin is warm and dry.  Psychiatric: She has a normal mood and affect.    CMP Latest Ref Rng & Units 12/08/2017 01/04/2017 11/01/2016  Glucose 65 - 99 mg/dL 134(H) 289(H) 225(H)  BUN 6 - 20 mg/dL 21(H) 11 14  Creatinine 0.44 - 1.00 mg/dL 0.69 0.72 0.63  Sodium 135 - 145 mmol/L 138 138 136  Potassium 3.5 - 5.1 mmol/L 3.6 4.0 3.7  Chloride 101 - 111 mmol/L 105 98 100  CO2 22 - 32 mmol/L '26 26 23  '$ Calcium 8.9 - 10.3 mg/dL 9.2 9.3 9.4  Total Protein 6.0 - 8.5 g/dL - 7.5 7.7  Total Bilirubin 0.0 - 1.2 mg/dL - 0.3 0.6  Alkaline Phos 39 - 117 IU/L - 97 103  AST 0 - 40 IU/L - 8 13  ALT 0 - 32 IU/L - 13 13    Lab Results  Component Value Date   HGBA1C 9.0 10/22/2017     Assessment & Plan:   1. Type 2 diabetes mellitus with complication, without long-term current use of insulin (HCC) Controlled with A1c of 9.0 Regimen was adjusted at her last office visit A1c is due at next visit and will adjust regimen accordingly - POCT glucose (manual entry)  2. Neck mass Placed on Flexeril due to allergy to Robaxin - US Soft Tissue Head/Neck; Future  3. Essential hypertension Slightly elevated Continue antihypertensives, no regimen change today Counseled on blood pressure goal of less than 130/80, low-sodium, DASH diet, medication compliance, 150 minutes of moderate intensity exercise per week. Discussed  medication compliance, adverse effects.    Meds ordered this encounter  Medications  . cyclobenzaprine (FLEXERIL) 10 MG tablet    Sig: Take 1 tablet (10 mg total) by mouth at bedtime.    Dispense:  30 tablet    Refill:  1    Follow-up: Return in about 1 month (around 01/31/2018) for Complete physical exam and follow-up of neck mass.   Charlott Rakes MD

## 2018-01-04 MED FILL — TRUEPLUS PEN NDL 31GX3/16": 31G X 5 MM | 30 days supply | Qty: 100 | Fill #5

## 2018-01-04 MED FILL — TRUEPLUS PEN NDL 31GX3/16: 31G X 5 MM | 30 days supply | Qty: 100 | Fill #5

## 2018-01-09 ENCOUNTER — Other Ambulatory Visit: Payer: Self-pay | Admitting: Family Medicine

## 2018-01-09 ENCOUNTER — Ambulatory Visit (HOSPITAL_COMMUNITY)
Admission: RE | Admit: 2018-01-09 | Discharge: 2018-01-09 | Disposition: A | Discharge status: Home / Self Care | Payer: 59 | Source: Ambulatory Visit | Attending: Family Medicine | Admitting: Family Medicine

## 2018-01-09 DIAGNOSIS — I10 Essential (primary) hypertension: Secondary | ICD-10-CM

## 2018-01-09 DIAGNOSIS — R221 Localized swelling, mass and lump, neck: Secondary | ICD-10-CM

## 2018-01-17 ENCOUNTER — Telehealth: Payer: Self-pay

## 2018-01-17 NOTE — Telephone Encounter (Signed)
Patient was called and informed to contact office for lab results. 

## 2018-01-17 NOTE — Telephone Encounter (Signed)
Patient returned call and was informed of lab results. 

## 2018-01-23 MED FILL — ATORVASTATIN 40 MG TABLET: 40 | 30 days supply | Qty: 30 | Fill #3

## 2018-01-23 MED FILL — glipiZIDE 10 MG TABS: 10 | 30 days supply | Qty: 60 | Fill #3

## 2018-01-23 MED FILL — LISINOPRIL 40 MG TAB: 40 | 30 days supply | Qty: 30 | Fill #3

## 2018-01-28 MED FILL — BASAGLAR 100 UNIT/ML KWIKPE: 100 | 31 days supply | Qty: 9 | Fill #3

## 2018-02-07 ENCOUNTER — Other Ambulatory Visit (HOSPITAL_COMMUNITY)
Admission: RE | Admit: 2018-02-07 | Discharge: 2018-02-07 | Disposition: A | Discharge status: Home / Self Care | Payer: 59 | Source: Ambulatory Visit | Attending: Family Medicine | Admitting: Family Medicine

## 2018-02-07 ENCOUNTER — Ambulatory Visit: Payer: 59

## 2018-02-07 ENCOUNTER — Ambulatory Visit: Payer: 59 | Attending: Family Medicine | Admitting: Family Medicine

## 2018-02-07 ENCOUNTER — Encounter: Payer: Self-pay | Admitting: Family Medicine

## 2018-02-07 VITALS — BP 130/81 | HR 87 | Temp 97.9°F | Ht 66.0 in | Wt 225.8 lb

## 2018-02-07 DIAGNOSIS — E669 Obesity, unspecified: Secondary | ICD-10-CM | POA: Diagnosis not present

## 2018-02-07 DIAGNOSIS — Z124 Encounter for screening for malignant neoplasm of cervix: Secondary | ICD-10-CM | POA: Insufficient documentation

## 2018-02-07 DIAGNOSIS — Z791 Long term (current) use of non-steroidal anti-inflammatories (NSAID): Secondary | ICD-10-CM | POA: Insufficient documentation

## 2018-02-07 DIAGNOSIS — Z794 Long term (current) use of insulin: Secondary | ICD-10-CM | POA: Diagnosis not present

## 2018-02-07 DIAGNOSIS — Z1231 Encounter for screening mammogram for malignant neoplasm of breast: Secondary | ICD-10-CM

## 2018-02-07 DIAGNOSIS — E785 Hyperlipidemia, unspecified: Secondary | ICD-10-CM | POA: Diagnosis not present

## 2018-02-07 DIAGNOSIS — Z0000 Encounter for general adult medical examination without abnormal findings: Secondary | ICD-10-CM | POA: Diagnosis not present

## 2018-02-07 DIAGNOSIS — IMO0001 Reserved for inherently not codable concepts without codable children: Secondary | ICD-10-CM

## 2018-02-07 DIAGNOSIS — R197 Diarrhea, unspecified: Secondary | ICD-10-CM | POA: Insufficient documentation

## 2018-02-07 DIAGNOSIS — I10 Essential (primary) hypertension: Secondary | ICD-10-CM

## 2018-02-07 DIAGNOSIS — K5900 Constipation, unspecified: Secondary | ICD-10-CM | POA: Diagnosis not present

## 2018-02-07 DIAGNOSIS — E1165 Type 2 diabetes mellitus with hyperglycemia: Secondary | ICD-10-CM | POA: Diagnosis not present

## 2018-02-07 DIAGNOSIS — M62838 Other muscle spasm: Secondary | ICD-10-CM | POA: Insufficient documentation

## 2018-02-07 DIAGNOSIS — Z1239 Encounter for other screening for malignant neoplasm of breast: Secondary | ICD-10-CM

## 2018-02-07 DIAGNOSIS — Z9889 Other specified postprocedural states: Secondary | ICD-10-CM | POA: Diagnosis not present

## 2018-02-07 DIAGNOSIS — E1169 Type 2 diabetes mellitus with other specified complication: Secondary | ICD-10-CM | POA: Diagnosis not present

## 2018-02-07 DIAGNOSIS — Z0001 Encounter for general adult medical examination with abnormal findings: Secondary | ICD-10-CM | POA: Diagnosis present

## 2018-02-07 DIAGNOSIS — R87810 Cervical high risk human papillomavirus (HPV) DNA test positive: Secondary | ICD-10-CM | POA: Insufficient documentation

## 2018-02-07 DIAGNOSIS — Z79899 Other long term (current) drug therapy: Secondary | ICD-10-CM | POA: Diagnosis not present

## 2018-02-07 LAB — GLUCOSE, POCT (MANUAL RESULT ENTRY): POC GLUCOSE: 238 mg/dL — AB (ref 70–99)

## 2018-02-07 LAB — POCT GLYCOSYLATED HEMOGLOBIN (HGB A1C): Hemoglobin A1C: 11.1

## 2018-02-07 MED ORDER — CYCLOBENZAPRINE HCL 10 MG PO TABS: 10.0000 mg | ORAL_TABLET | Freq: Every day | ORAL | 1 refills | Status: DC

## 2018-02-07 MED ORDER — BASAGLAR KWIKPEN 100 UNIT/ML ~~LOC~~ SOPN: 35.0000 [IU] | PEN_INJECTOR | Freq: Every day | SUBCUTANEOUS | 3 refills | Status: DC

## 2018-02-07 MED ORDER — LISINOPRIL 40 MG PO TABS: 40.0000 mg | ORAL_TABLET | Freq: Every day | ORAL | 3 refills | Status: DC

## 2018-02-07 MED ORDER — GLIPIZIDE 10 MG PO TABS: ORAL_TABLET | ORAL | 3 refills | Status: DC

## 2018-02-07 MED ORDER — ATORVASTATIN CALCIUM 40 MG PO TABS: 40.0000 mg | ORAL_TABLET | Freq: Every day | ORAL | 3 refills | Status: DC

## 2018-02-07 NOTE — Patient Instructions (Signed)

## 2018-02-07 NOTE — Progress Notes (Signed)
Patient here for lab visit only 

## 2018-02-07 NOTE — Progress Notes (Signed)
Subjective:  Patient ID: Sheila Bishop, female    DOB: [birth year]  Age: 47 y.o. MRN: [id number]  CC: Diabetes   HPI Sheila Bishop  is a 47 year old female with a history of type 2 diabetes mellitus (A1c 11.1), hypertension, hyperlipidemiapresents for complete physical exam. Her last mammogram was in 04/2011 at which time she had a left breast abscess and had had previous biopsy which was negative for malignancy and last Pap smear 12/2013. She does have intermittent right leg spasms and is currently having one in the clinic today.  She is also constipated as a result of taking Imodium due to diarrhea and her stomach feels uncomfortable.  Past Medical History:  Diagnosis Date  . Breast abscess Nov 2012   left  . Depression 2007  . Diabetes mellitus 2009  . Headache(784.0)   . Hypertension 2009  . Obesity   . Wears glasses     Past Surgical History:  Procedure Laterality Date  . BREAST BIOPSY  08/07/2011   Procedure: BREAST BIOPSY;  Surgeon: Joyice Faster. Cornett, MD;  Location: [facility];  Service: General;  Laterality: Left;  BIOPSY BREAST LEFT SIDE  . CESAREAN SECTION  2002    No Known Allergies    Outpatient Medications Prior to Visit  Medication Sig Dispense Refill  . Blood Glucose Monitoring Suppl (ONE TOUCH ULTRA 2) w/Device KIT Use 3 times daily before meals 1 each 0  . glucose blood test strip Use as instructed 3 times daily before meals 100 each 12  . ibuprofen (ADVIL,MOTRIN) 800 MG tablet Take 1 tablet (800 mg total) by mouth 3 (three) times daily. 21 tablet 0  . Lancets (ONETOUCH ULTRASOFT) lancets Use as instructed 3 times daily before meals 100 each 12  . loperamide (IMODIUM A-D) 2 MG capsule 2 mg Q 6 hrs PRN 12 capsule 0  . TRUEPLUS PEN NEEDLES 31G X 5 MM MISC USE AS DIRECTED 100 each 5  . atorvastatin (LIPITOR) 40 MG tablet Take 1 tablet (40 mg total) by mouth daily. 30 tablet 3  . cyclobenzaprine (FLEXERIL) 10 MG tablet Take 1 tablet (10 mg total) by mouth at  bedtime. 30 tablet 1  . glipiZIDE (GLUCOTROL) 10 MG tablet TAKE 1 TABLET BY MOUTH 2 TIMES DAILY BEFORE A MEAL. 60 tablet 3  . Insulin Glargine (BASAGLAR KWIKPEN) 100 UNIT/ML SOPN Inject 0.25 mLs (25 Units total) into the skin at bedtime. 3 pen 3  . lisinopril (PRINIVIL,ZESTRIL) 40 MG tablet Take 1 tablet (40 mg total) by mouth daily. 30 tablet 3  . benzonatate (TESSALON) 100 MG capsule Take 1 capsule (100 mg total) by mouth 2 (two) times daily as needed for cough. (Patient not taking: Reported on 01/01/2018) 20 capsule 0  . methocarbamol (ROBAXIN) 500 MG tablet Take 1 tablet (500 mg total) by mouth 2 (two) times daily. (Patient not taking: Reported on 01/01/2018) 20 tablet 0   No facility-administered medications prior to visit.     ROS Review of Systems  Constitutional: Negative for activity change, appetite change and fatigue.  HENT: Negative for congestion, sinus pressure and sore throat.   Eyes: Negative for visual disturbance.  Respiratory: Negative for cough, chest tightness, shortness of breath and wheezing.   Cardiovascular: Negative for chest pain and palpitations.  Gastrointestinal: Positive for constipation. Negative for abdominal distention and abdominal pain.  Endocrine: Negative for polydipsia.  Genitourinary: Negative for dysuria and frequency.  Musculoskeletal:       See hpi  Skin: Negative for rash.  Neurological: Negative for tremors, light-headedness and numbness.  Hematological: Does not bruise/bleed easily.  Psychiatric/Behavioral: Negative for agitation and behavioral problems.    Objective:  BP 130/81   Pulse 87   Temp 97.9 F (36.6 C) (Oral)   Ht _0  (1.676 m)   Wt 225 lb 12.8 oz (102.4 kg)   SpO2 99%   BMI 36.45 kg/m   BP/Weight 02/07/2018 01/01/2018 7/34/1937  Systolic BP 902 409 735  Diastolic BP 81 87 80  Wt. (Lbs) 225.8 230.4 -  BMI 36.45 37.19 -      Physical Exam  Constitutional: She is oriented to person, place, and time. She appears  well-developed and well-nourished. No distress.  HENT:  Head: Normocephalic.  Right Ear: External ear normal.  Left Ear: External ear normal.  Nose: Nose normal.  Mouth/Throat: Oropharynx is clear and moist.  Eyes: Pupils are equal, round, and reactive to light. Conjunctivae and EOM are normal.  Neck: Normal range of motion. No JVD present.  Cardiovascular: Normal rate, regular rhythm, normal heart sounds and intact distal pulses. Exam reveals no gallop.  No murmur heard. Pulmonary/Chest: Effort normal and breath sounds normal. No respiratory distress. She has no wheezes. She has no rales. She exhibits no tenderness. Right breast exhibits no mass and no tenderness. Left breast exhibits no mass and no tenderness.  Abdominal: Soft. Bowel sounds are normal. She exhibits no distension and no mass. There is no tenderness.  Genitourinary:  Genitourinary Comments: Normal external genitalia, normal vagina, normal cervix, normal adnexa  Musculoskeletal: Normal range of motion. She exhibits no edema or tenderness.  Neurological: She is alert and oriented to person, place, and time. She has normal reflexes.  Skin: Skin is warm and dry. She is not diaphoretic.  Psychiatric: She has a normal mood and affect.     Assessment & Plan:   1. Annual physical exam Counseled on 150 minutes of exercise per week, healthy eating (including decreased daily intake of saturated fats, cholesterol, added sugars, sodium), STI prevention, routine healthcare maintenance.   2. Essential hypertension - lisinopril (PRINIVIL,ZESTRIL) 40 MG tablet; Take 1 tablet (40 mg total) by mouth daily.  Dispense: 30 tablet; Refill: 3  3. Uncontrolled type 2 diabetes mellitus without complication, with long-term current use of insulin (HCC) Uncontrolled Increased dose of Lantus - POCT glucose (manual entry) - POCT glycosylated hemoglobin (Hb A1C) - Insulin Glargine (BASAGLAR KWIKPEN) 100 UNIT/ML SOPN; Inject 0.35 mLs (35 Units  total) into the skin at bedtime.  Dispense: 3 pen; Refill: 3 - glipiZIDE (GLUCOTROL) 10 MG tablet; TAKE 1 TABLET BY MOUTH 2 TIMES DAILY BEFORE A MEAL.  Dispense: 60 tablet; Refill: 3  4. Dyslipidemia associated with type 2 diabetes mellitus (HCC) - atorvastatin (LIPITOR) 40 MG tablet; Take 1 tablet (40 mg total) by mouth daily.  Dispense: 30 tablet; Refill: 3  5. Screening for breast cancer - MM Digital Screening; Future  6. Screening for cervical cancer - Cytology - PAP([facility])  7. Muscle spasm Based on Flexeril   Meds ordered this encounter  Medications  . lisinopril (PRINIVIL,ZESTRIL) 40 MG tablet    Sig: Take 1 tablet (40 mg total) by mouth daily.    Dispense:  30 tablet    Refill:  3  . Insulin Glargine (BASAGLAR KWIKPEN) 100 UNIT/ML SOPN    Sig: Inject 0.35 mLs (35 Units total) into the skin at bedtime.    Dispense:  3 pen    Refill:  3    Discontinue previous dose  .  glipiZIDE (GLUCOTROL) 10 MG tablet    Sig: TAKE 1 TABLET BY MOUTH 2 TIMES DAILY BEFORE A MEAL.    Dispense:  60 tablet    Refill:  3  . cyclobenzaprine (FLEXERIL) 10 MG tablet    Sig: Take 1 tablet (10 mg total) by mouth at bedtime.    Dispense:  30 tablet    Refill:  1  . atorvastatin (LIPITOR) 40 MG tablet    Sig: Take 1 tablet (40 mg total) by mouth daily.    Dispense:  30 tablet    Refill:  3    Follow-up: Return in about 3 months (around 05/10/2018) for Follow-up on chronic medical conditions.   Charlott Rakes MD

## 2018-02-08 LAB — MICROALBUMIN / CREATININE URINE RATIO
CREATININE, UR: 96 mg/dL
Microalb/Creat Ratio: 1199.6 mg/g creat — ABNORMAL HIGH (ref 0.0–30.0)
Microalbumin, Urine: 1151.6 ug/mL

## 2018-02-08 LAB — LIPID PANEL
CHOL/HDL RATIO: 3.9 ratio (ref 0.0–4.4)
Cholesterol, Total: 162 mg/dL (ref 100–199)
HDL: 42 mg/dL (ref >39)
LDL Calculated: 96 mg/dL (ref 0–99)
Triglycerides: 118 mg/dL (ref 0–149)
VLDL CHOLESTEROL CAL: 24 mg/dL (ref 5–40)

## 2018-02-08 LAB — CMP14+EGFR
A/G RATIO: 1.3 (ref 1.2–2.2)
ALT: 15 IU/L (ref 0–32)
AST: 13 IU/L (ref 0–40)
Albumin: 3.9 g/dL (ref 3.5–5.5)
Alkaline Phosphatase: 119 IU/L — ABNORMAL HIGH (ref 39–117)
BUN/Creatinine Ratio: 12 (ref 9–23)
BUN: 9 mg/dL (ref 6–24)
Bilirubin Total: 0.4 mg/dL (ref 0.0–1.2)
CO2: 24 mmol/L (ref 20–29)
Calcium: 9.3 mg/dL (ref 8.7–10.2)
Chloride: 97 mmol/L (ref 96–106)
Creatinine, Ser: 0.74 mg/dL (ref 0.57–1.00)
GFR, EST AFRICAN AMERICAN: 114 mL/min/{1.73_m2} (ref >59)
GFR, EST NON AFRICAN AMERICAN: 99 mL/min/{1.73_m2} (ref >59)
GLUCOSE: 223 mg/dL — AB (ref 65–99)
Globulin, Total: 3 g/dL (ref 1.5–4.5)
Potassium: 4 mmol/L (ref 3.5–5.2)
Sodium: 135 mmol/L (ref 134–144)
TOTAL PROTEIN: 6.9 g/dL (ref 6.0–8.5)

## 2018-02-11 LAB — CYTOLOGY - PAP
CANDIDA VAGINITIS: NEGATIVE
Chlamydia: NEGATIVE
Diagnosis: NEGATIVE
HPV 16/18/45 GENOTYPING: NEGATIVE
HPV: DETECTED — AB
Neisseria Gonorrhea: NEGATIVE
TRICH (WINDOWPATH): NEGATIVE

## 2018-02-11 LAB — CERVICOVAGINAL ANCILLARY ONLY: HERPES (WINDOWPATH): NEGATIVE

## 2018-02-13 ENCOUNTER — Telehealth: Payer: Self-pay

## 2018-02-13 NOTE — Telephone Encounter (Signed)
Patient was called and informed of lab results. 

## 2018-02-18 ENCOUNTER — Telehealth: Payer: Self-pay | Admitting: *Deleted

## 2018-02-18 NOTE — Telephone Encounter (Signed)
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 Patient advised to have a repeat completed in one year. No further questions at this time.

## 2018-02-19 MED FILL — BASAGLAR 100 UNIT/ML KWIKPE: 100 | 25 days supply | Qty: 9 | Fill #0

## 2018-02-21 MED FILL — ATORVASTATIN CALCIUM 40 MG: 40 | 30 days supply | Qty: 30 | Fill #0

## 2018-02-21 MED FILL — CYCLOBENZAPRINE 10 MG TAB: 10 | 30 days supply | Qty: 30 | Fill #0

## 2018-02-21 MED FILL — LISINOPRIL 40 MG TABLET: 40 | 30 days supply | Qty: 30 | Fill #0

## 2018-02-21 MED FILL — glipiZIDE 10 MG TABS: 10 | 30 days supply | Qty: 60 | Fill #0

## 2018-02-27 MED FILL — ONE TOUCH DELICA 33G LANCET: 30 days supply | Qty: 100 | Fill #1

## 2018-03-04 ENCOUNTER — Ambulatory Visit
Admission: RE | Admit: 2018-03-04 | Discharge: 2018-03-04 | Disposition: A | Discharge status: Home / Self Care | Payer: 59 | Source: Ambulatory Visit | Attending: Family Medicine | Admitting: Family Medicine

## 2018-03-04 DIAGNOSIS — Z1239 Encounter for other screening for malignant neoplasm of breast: Secondary | ICD-10-CM

## 2018-03-11 MED FILL — BASAGLAR 100 UNIT/ML KWIKPE: 100 | 25 days supply | Qty: 9 | Fill #1

## 2018-03-26 MED FILL — ATORVASTATIN CALCIUM 40 MG: 40 | 30 days supply | Qty: 30 | Fill #1

## 2018-03-26 MED FILL — glipiZIDE 10 MG TABS: 10 | 30 days supply | Qty: 60 | Fill #1

## 2018-03-26 MED FILL — LISINOPRIL 40 MG TABLET: 40 | 30 days supply | Qty: 30 | Fill #1

## 2018-04-02 MED FILL — BASAGLAR 100 UNIT/ML KWIKPE: 100 | 25 days supply | Qty: 9 | Fill #2

## 2018-04-22 MED FILL — glipiZIDE 10 MG TABS: 10 | 30 days supply | Qty: 60 | Fill #2

## 2018-04-22 MED FILL — ATORVASTATIN CALCIUM 40 MG: 40 | 30 days supply | Qty: 30 | Fill #2

## 2018-04-22 MED FILL — BASAGLAR 100 UNIT/ML KWIKPE: 100 | 25 days supply | Qty: 9 | Fill #3

## 2018-04-22 MED FILL — LISINOPRIL 40 MG TABLET: 40 | 30 days supply | Qty: 30 | Fill #2

## 2018-05-15 ENCOUNTER — Encounter: Payer: Self-pay | Admitting: Family Medicine

## 2018-05-15 ENCOUNTER — Ambulatory Visit: Payer: BLUE CROSS/BLUE SHIELD | Attending: Family Medicine | Admitting: Family Medicine

## 2018-05-15 VITALS — BP 147/88 | HR 99 | Temp 98.1°F | Ht 66.0 in | Wt 238.0 lb

## 2018-05-15 DIAGNOSIS — K0889 Other specified disorders of teeth and supporting structures: Secondary | ICD-10-CM | POA: Diagnosis not present

## 2018-05-15 DIAGNOSIS — Z6838 Body mass index (BMI) 38.0-38.9, adult: Secondary | ICD-10-CM | POA: Diagnosis not present

## 2018-05-15 DIAGNOSIS — E119 Type 2 diabetes mellitus without complications: Secondary | ICD-10-CM | POA: Diagnosis present

## 2018-05-15 DIAGNOSIS — Z794 Long term (current) use of insulin: Secondary | ICD-10-CM | POA: Diagnosis not present

## 2018-05-15 DIAGNOSIS — Z79899 Other long term (current) drug therapy: Secondary | ICD-10-CM | POA: Insufficient documentation

## 2018-05-15 DIAGNOSIS — E11649 Type 2 diabetes mellitus with hypoglycemia without coma: Secondary | ICD-10-CM

## 2018-05-15 DIAGNOSIS — E1165 Type 2 diabetes mellitus with hyperglycemia: Secondary | ICD-10-CM | POA: Insufficient documentation

## 2018-05-15 DIAGNOSIS — E669 Obesity, unspecified: Secondary | ICD-10-CM | POA: Insufficient documentation

## 2018-05-15 DIAGNOSIS — I10 Essential (primary) hypertension: Secondary | ICD-10-CM | POA: Insufficient documentation

## 2018-05-15 DIAGNOSIS — E785 Hyperlipidemia, unspecified: Secondary | ICD-10-CM | POA: Insufficient documentation

## 2018-05-15 DIAGNOSIS — E1169 Type 2 diabetes mellitus with other specified complication: Secondary | ICD-10-CM | POA: Diagnosis not present

## 2018-05-15 LAB — POCT GLYCOSYLATED HEMOGLOBIN (HGB A1C): HBA1C, POC (CONTROLLED DIABETIC RANGE): 11.2 % — AB (ref 0.0–7.0)

## 2018-05-15 LAB — GLUCOSE, POCT (MANUAL RESULT ENTRY): POC GLUCOSE: 360 mg/dL — AB (ref 70–99)

## 2018-05-15 MED ORDER — LIRAGLUTIDE 18 MG/3ML ~~LOC~~ SOPN: 1.8000 mg | PEN_INJECTOR | Freq: Every day | SUBCUTANEOUS | 3 refills | Status: DC

## 2018-05-15 MED ORDER — LISINOPRIL 40 MG PO TABS: 40.0000 mg | ORAL_TABLET | Freq: Every day | ORAL | 3 refills | Status: DC

## 2018-05-15 MED ORDER — ATORVASTATIN CALCIUM 40 MG PO TABS: 40.0000 mg | ORAL_TABLET | Freq: Every day | ORAL | 3 refills | Status: DC

## 2018-05-15 MED ORDER — GLIPIZIDE 10 MG PO TABS: 20.0000 mg | ORAL_TABLET | Freq: Two times a day (BID) | ORAL | 3 refills | Status: DC

## 2018-05-15 NOTE — Progress Notes (Signed)
 Subjective:  Patient ID: Sheila Bishop, female    DOB: 01/17/1971  Age: 46 y.o. MRN: [id number]  CC: Diabetes   HPI Sheila Bishop is a 46-year-old female with history of type 2 diabetes mellitus (A1c 11.2), hypertension, hyperlipidemia who presents today for a follow-up visit. She endorses compliance with Basaglar but states she usually runs out prior to the end of the month as she does not receive enough pens to last a whole month from the pharmacy.  Also noticed she has been gaining weight ever since commencement of Basaglar.  Blood sugars have been in the 280-360 range at home.  Wondering if she can be placed on Januvia and Basaglar discontinued. Denies blurry vision, numbness in extremities and is not compliant with a diabetic diet or exercise. Tolerating her antihypertensive and her statin with no other adverse effects. She recently had extraction of her wisdom tooth and is currently in pain; this has caused her to eat pured foods only.  Scheduled for extraction of wisdom tooth on the right next month.  Past Medical History:  Diagnosis Date  . Breast abscess Nov 2012   left  . Depression 2007  . Diabetes mellitus 2009  . Headache(784.0)   . Hypertension 2009  . Obesity   . Wears glasses     Past Surgical History:  Procedure Laterality Date  . BREAST BIOPSY  08/07/2011   Procedure: BREAST BIOPSY;  Surgeon: Thomas A. Cornett, MD;  Location: [facility];  Service: General;  Laterality: Left;  BIOPSY BREAST LEFT SIDE  . CESAREAN SECTION  2002    No Known Allergies   Outpatient Medications Prior to Visit  Medication Sig Dispense Refill  . Blood Glucose Monitoring Suppl (ONE TOUCH ULTRA 2) w/Device KIT Use 3 times daily before meals 1 each 0  . cyclobenzaprine (FLEXERIL) 10 MG tablet Take 1 tablet (10 mg total) by mouth at bedtime. 30 tablet 1  . glucose blood test strip Use as instructed 3 times daily before meals 100 each 12  . ibuprofen (ADVIL,MOTRIN) 800 MG tablet Take  1 tablet (800 mg total) by mouth 3 (three) times daily. 21 tablet 0  . Lancets (ONETOUCH ULTRASOFT) lancets Use as instructed 3 times daily before meals 100 each 12  . loperamide (IMODIUM A-D) 2 MG capsule 2 mg Q 6 hrs PRN 12 capsule 0  . TRUEPLUS PEN NEEDLES 31G X 5 MM MISC USE AS DIRECTED 100 each 5  . atorvastatin (LIPITOR) 40 MG tablet Take 1 tablet (40 mg total) by mouth daily. 30 tablet 3  . glipiZIDE (GLUCOTROL) 10 MG tablet TAKE 1 TABLET BY MOUTH 2 TIMES DAILY BEFORE A MEAL. 60 tablet 3  . Insulin Glargine (BASAGLAR KWIKPEN) 100 UNIT/ML SOPN Inject 0.35 mLs (35 Units total) into the skin at bedtime. 3 pen 3  . lisinopril (PRINIVIL,ZESTRIL) 40 MG tablet Take 1 tablet (40 mg total) by mouth daily. 30 tablet 3  . benzonatate (TESSALON) 100 MG capsule Take 1 capsule (100 mg total) by mouth 2 (two) times daily as needed for cough. (Patient not taking: Reported on 01/01/2018) 20 capsule 0   No facility-administered medications prior to visit.     ROS Review of Systems  Constitutional: Negative for activity change, appetite change and fatigue.  HENT: Positive for dental problem. Negative for congestion, sinus pressure and sore throat.   Eyes: Negative for visual disturbance.  Respiratory: Negative for cough, chest tightness, shortness of breath and wheezing.   Cardiovascular: Negative for chest pain   and palpitations.  Gastrointestinal: Negative for abdominal distention, abdominal pain and constipation.  Endocrine: Negative for polydipsia.  Genitourinary: Negative for dysuria and frequency.  Musculoskeletal: Negative for arthralgias and back pain.  Skin: Negative for rash.  Neurological: Negative for tremors, light-headedness and numbness.  Hematological: Does not bruise/bleed easily.  Psychiatric/Behavioral: Negative for agitation and behavioral problems.    Objective:  BP (!) 147/88   Pulse 99   Temp 98.1 F (36.7 C) (Oral)   Ht 5' 6" (1.676 m)   Wt 238 lb (108 kg)   SpO2 99%    BMI 38.41 kg/m   BP/Weight 05/15/2018 02/04/2562 05/10/3733  Systolic BP 287 681 157  Diastolic BP 88 81 87  Wt. (Lbs) 238 225.8 230.4  BMI 38.41 36.45 37.19      Physical Exam  Constitutional: She is oriented to person, place, and time. She appears well-developed and well-nourished.  HENT:  Edema of left jaw   Cardiovascular: Normal rate, normal heart sounds and intact distal pulses.  No murmur heard. Pulmonary/Chest: Effort normal and breath sounds normal. She has no wheezes. She has no rales. She exhibits no tenderness.  Abdominal: Soft. Bowel sounds are normal. She exhibits no distension and no mass. There is no tenderness.  Musculoskeletal: Normal range of motion.  Neurological: She is alert and oriented to person, place, and time.  Skin: Skin is warm and dry.  Psychiatric: She has a normal mood and affect.    CMP Latest Ref Rng & Units 02/07/2018 12/08/2017 01/04/2017  Glucose 65 - 99 mg/dL 223(H) 134(H) 289(H)  BUN 6 - 24 mg/dL 9 21(H) 11  Creatinine 0.57 - 1.00 mg/dL 0.74 0.69 0.72  Sodium 134 - 144 mmol/L 135 138 138  Potassium 3.5 - 5.2 mmol/L 4.0 3.6 4.0  Chloride 96 - 106 mmol/L 97 105 98  CO2 20 - 29 mmol/L _0 Calcium 8.7 - 10.2 mg/dL 9.3 9.2 9.3  Total Protein 6.0 - 8.5 g/dL 6.9 - 7.5  Total Bilirubin 0.0 - 1.2 mg/dL 0.4 - 0.3  Alkaline Phos 39 - 117 IU/L 119(H) - 97  AST 0 - 40 IU/L 13 - 8  ALT 0 - 32 IU/L 15 - 13    Lipid Panel     Component Value Date/Time   CHOL 162 02/07/2018 1026   TRIG 118 02/07/2018 1026   HDL 42 02/07/2018 1026   CHOLHDL 3.9 02/07/2018 1026   CHOLHDL 3.9 11/01/2016 1608   VLDL 18 06/16/2010 2054   LDLCALC 96 02/07/2018 1026   LDLDIRECT 131 (H) 04/21/2010 2016    Lab Results  Component Value Date   HGBA1C 11.2 (A) 05/15/2018     Assessment & Plan:   1. Type 2 diabetes mellitus with hyperglycemia, without long-term current use of insulin (HCC) Uncontrolled with A1c of 11.2 Victoza added to regimen and Lantus  discontinued as she complains of weight gain Increased dose of glipizide Clinical pharmacist called in for instructions on administration and titration of Victoza and she will see him weekly - POCT glucose (manual entry) - POCT glycosylated hemoglobin (Hb A1C) - glipiZIDE (GLUCOTROL) 10 MG tablet; Take 2 tablets (20 mg total) by mouth 2 (two) times daily before a meal.  Dispense: 120 tablet; Refill: 3 - liraglutide (VICTOZA) 18 MG/3ML SOPN; Inject 0.3 mLs (1.8 mg total) into the skin daily with breakfast. Start with 0.1 mL daily for 1 week then 0.2 mL for 1 week then 0.3 mils thereafter  Dispense: 3 pen; Refill: 3  2. Dyslipidemia associated with type 2 diabetes mellitus (HCC) Controlled Low-cholesterol diet - atorvastatin (LIPITOR) 40 MG tablet; Take 1 tablet (40 mg total) by mouth daily.  Dispense: 30 tablet; Refill: 3  3. Essential hypertension Slightly elevated This could be possibly due to pain Counseled on blood pressure goal of less than 130/80, low-sodium, DASH diet, medication compliance, 150 minutes of moderate intensity exercise per week. Discussed medication compliance, adverse effects. - lisinopril (PRINIVIL,ZESTRIL) 40 MG tablet; Take 1 tablet (40 mg total) by mouth daily.  Dispense: 30 tablet; Refill: 3  4. Tooth ache Currently on antibiotics and analgesics from the dentist   Meds ordered this encounter  Medications  . glipiZIDE (GLUCOTROL) 10 MG tablet    Sig: Take 2 tablets (20 mg total) by mouth 2 (two) times daily before a meal.    Dispense:  120 tablet    Refill:  3    Discontinue previous dose  . atorvastatin (LIPITOR) 40 MG tablet    Sig: Take 1 tablet (40 mg total) by mouth daily.    Dispense:  30 tablet    Refill:  3  . lisinopril (PRINIVIL,ZESTRIL) 40 MG tablet    Sig: Take 1 tablet (40 mg total) by mouth daily.    Dispense:  30 tablet    Refill:  3  . liraglutide (VICTOZA) 18 MG/3ML SOPN    Sig: Inject 0.3 mLs (1.8 mg total) into the skin daily with  breakfast. Start with 0.1 mL daily for 1 week then 0.2 mL for 1 week then 0.3 mils thereafter    Dispense:  3 pen    Refill:  3    Discontinue Basaglar    Follow-up: Return in about 1 week (around 05/22/2018) for Follow-up of diabetes with Lurena Joiner; 3 months with PCP.   Charlott Rakes MD

## 2018-05-15 NOTE — Patient Instructions (Signed)
Diabetes Mellitus and Nutrition When you have diabetes (diabetes mellitus), it is very important to have healthy eating habits because your blood sugar (glucose) levels are greatly affected by what you eat and drink. Eating healthy foods in the appropriate amounts, at about the same times every day, can help you:  Control your blood glucose.  Lower your risk of heart disease.  Improve your blood pressure.  Reach or maintain a healthy weight.  Every person with diabetes is different, and each person has different needs for a meal plan. Your health care provider may recommend that you work with a diet and nutrition specialist (dietitian) to make a meal plan that is best for you. Your meal plan may vary depending on factors such as:  The calories you need.  The medicines you take.  Your weight.  Your blood glucose, blood pressure, and cholesterol levels.  Your activity level.  Other health conditions you have, such as heart or kidney disease.  How do carbohydrates affect me? Carbohydrates affect your blood glucose level more than any other type of food. Eating carbohydrates naturally increases the amount of glucose in your blood. Carbohydrate counting is a method for keeping track of how many carbohydrates you eat. Counting carbohydrates is important to keep your blood glucose at a healthy level, especially if you use insulin or take certain oral diabetes medicines. It is important to know how many carbohydrates you can safely have in each meal. This is different for every person. Your dietitian can help you calculate how many carbohydrates you should have at each meal and for snack. Foods that contain carbohydrates include:  Bread, cereal, rice, pasta, and crackers.  Potatoes and corn.  Peas, beans, and lentils.  Milk and yogurt.  Fruit and juice.  Desserts, such as cakes, cookies, ice cream, and candy.  How does alcohol affect me? Alcohol can cause a sudden decrease in blood  glucose (hypoglycemia), especially if you use insulin or take certain oral diabetes medicines. Hypoglycemia can be a life-threatening condition. Symptoms of hypoglycemia (sleepiness, dizziness, and confusion) are similar to symptoms of having too much alcohol. If your health care provider says that alcohol is safe for you, follow these guidelines:  Limit alcohol intake to no more than 1 drink per day for nonpregnant women and 2 drinks per day for men. One drink equals 12 oz of beer, 5 oz of wine, or 1 oz of hard liquor.  Do not drink on an empty stomach.  Keep yourself hydrated with water, diet soda, or unsweetened iced tea.  Keep in mind that regular soda, juice, and other mixers may contain a lot of sugar and must be counted as carbohydrates.  What are tips for following this plan? Reading food labels  Start by checking the serving size on the label. The amount of calories, carbohydrates, fats, and other nutrients listed on the label are based on one serving of the food. Many foods contain more than one serving per package.  Check the total grams (g) of carbohydrates in one serving. You can calculate the number of servings of carbohydrates in one serving by dividing the total carbohydrates by 15. For example, if a food has 30 g of total carbohydrates, it would be equal to 2 servings of carbohydrates.  Check the number of grams (g) of saturated and trans fats in one serving. Choose foods that have low or no amount of these fats.  Check the number of milligrams (mg) of sodium in one serving. Most people   should limit total sodium intake to less than 2,300 mg per day.  Always check the nutrition information of foods labeled as "low-fat" or "nonfat". These foods may be higher in added sugar or refined carbohydrates and should be avoided.  Talk to your dietitian to identify your daily goals for nutrients listed on the label. Shopping  Avoid buying canned, premade, or processed foods. These  foods tend to be high in fat, sodium, and added sugar.  Shop around the outside edge of the grocery store. This includes fresh fruits and vegetables, bulk grains, fresh meats, and fresh dairy. Cooking  Use low-heat cooking methods, such as baking, instead of high-heat cooking methods like deep frying.  Cook using healthy oils, such as olive, canola, or sunflower oil.  Avoid cooking with butter, cream, or high-fat meats. Meal planning  Eat meals and snacks regularly, preferably at the same times every day. Avoid going long periods of time without eating.  Eat foods high in fiber, such as fresh fruits, vegetables, beans, and whole grains. Talk to your dietitian about how many servings of carbohydrates you can eat at each meal.  Eat 4-6 ounces of lean protein each day, such as lean meat, chicken, fish, eggs, or tofu. 1 ounce is equal to 1 ounce of meat, chicken, or fish, 1 egg, or 1/4 cup of tofu.  Eat some foods each day that contain healthy fats, such as avocado, nuts, seeds, and fish. Lifestyle   Check your blood glucose regularly.  Exercise at least 30 minutes 5 or more days each week, or as told by your health care provider.  Take medicines as told by your health care provider.  Do not use any products that contain nicotine or tobacco, such as cigarettes and e-cigarettes. If you need help quitting, ask your health care provider.  Work with a counselor or diabetes educator to identify strategies to manage stress and any emotional and social challenges. What are some questions to ask my health care provider?  Do I need to meet with a diabetes educator?  Do I need to meet with a dietitian?  What number can I call if I have questions?  When are the best times to check my blood glucose? Where to find more information:  American Diabetes Association: diabetes.org/food-and-fitness/food  Academy of Nutrition and Dietetics:  www.eatright.org/resources/health/diseases-and-conditions/diabetes  National Institute of Diabetes and Digestive and Kidney Diseases (NIH): www.niddk.nih.gov/health-information/diabetes/overview/diet-eating-physical-activity Summary  A healthy meal plan will help you control your blood glucose and maintain a healthy lifestyle.  Working with a diet and nutrition specialist (dietitian) can help you make a meal plan that is best for you.  Keep in mind that carbohydrates and alcohol have immediate effects on your blood glucose levels. It is important to count carbohydrates and to use alcohol carefully. This information is not intended to replace advice given to you by your health care provider. Make sure you discuss any questions you have with your health care provider. Document Released: 06/15/2005 Document Revised: 10/23/2016 Document Reviewed: 10/23/2016 Elsevier Interactive Patient Education  2018 Else[employer] Inc.  

## 2018-05-15 NOTE — Progress Notes (Signed)
Patient would like to be put back on Junivia

## 2018-05-17 ENCOUNTER — Other Ambulatory Visit: Payer: Self-pay

## 2018-05-17 DIAGNOSIS — E1165 Type 2 diabetes mellitus with hyperglycemia: Secondary | ICD-10-CM

## 2018-05-17 DIAGNOSIS — I10 Essential (primary) hypertension: Secondary | ICD-10-CM

## 2018-05-17 MED ORDER — INSULIN PEN NEEDLE 32G X 4 MM MISC: 2 refills | Status: DC

## 2018-05-17 NOTE — Telephone Encounter (Signed)
Pharmacy requests rx for pen needles to be used w/ victoza

## 2018-05-29 ENCOUNTER — Ambulatory Visit: Payer: BLUE CROSS/BLUE SHIELD | Attending: Family Medicine | Admitting: Pharmacist

## 2018-05-29 ENCOUNTER — Ambulatory Visit: Payer: BLUE CROSS/BLUE SHIELD | Admitting: Pharmacist

## 2018-05-29 ENCOUNTER — Encounter: Payer: Self-pay | Admitting: Pharmacist

## 2018-05-29 DIAGNOSIS — Z87891 Personal history of nicotine dependence: Secondary | ICD-10-CM | POA: Insufficient documentation

## 2018-05-29 DIAGNOSIS — E1165 Type 2 diabetes mellitus with hyperglycemia: Secondary | ICD-10-CM | POA: Diagnosis present

## 2018-05-29 NOTE — Patient Instructions (Addendum)
Sugars are much improved! Great job!   Please increase Victoza to 1.2 mg daily. I will see you in 1 week. Please bring home sugar readings to that appointment.

## 2018-05-29 NOTE — Progress Notes (Signed)
    S:   PCP: Dr. Margarita Rana   No chief complaint on file.  Patient arrives in good spirits. Presents for victoza titration and DM management. Patient was referred and last seen by Dr. Margarita Rana on 05/15/18. At that visit, Lantus discontinued d/t weight gain. Victoza added to regimen and glipizide increased.   Today, patient reports feeling well. Denies hypglycemia. Denies any side effects from Victoza.   Patient reports adherence with medications.  Current diabetes medications include:  - Glipizide 10 mg: 2 tablets BID - Victoza: 0.6 mg daily    Family/Social History:  - HTN, hyperlipidemia, and heart disease (mother) - Tobacco: former smoker (quit 2009) - Alcohol: denies use  Insurance coverage/medication affordability:  - BCBS  Patient reported dietary habits: Eats 2-3 meals/day -Reports improvement in diet  -Patient states that she's eating more fresh fruits and veggies  - Limits carbs  Patient-reported exercise habits:  - Pt states she's working on obtaining a gym membership   Patient denies nocturia.  Patient denies neuropathy. Patient denies visual changes. Patient denies self foot exams.   O:  POCT glucose: 112  Lab Results  Component Value Date   HGBA1C 11.2 (A) 05/15/2018   There were no vitals filed for this visit.  Lipid Panel     Component Value Date/Time   CHOL 162 02/07/2018 1026   TRIG 118 02/07/2018 1026   HDL 42 02/07/2018 1026   CHOLHDL 3.9 02/07/2018 1026   CHOLHDL 3.9 11/01/2016 1608   VLDL 18 06/16/2010 2054   LDLCALC 96 02/07/2018 1026   LDLDIRECT 131 (H) 04/21/2010 2016    Home fasting CBG: 110s-120s  2 hour post-prandial/random CBG: Denies any reading > 180  A/P: Diabetes longstanding currently uncontrolled with A1c of 11.2. Reported home sugar levels and POCT glucose today improved/at goal. Patient is able to verbalize appropriate hypoglycemia management plan. Patient is adherent with medication. She reports no nausea or GI discomfort  with Victoza. She is very encouraged and pleased with her improvement. I commended patient for this. Will increase Victoza from 0.6 to 1.2 mg daily. I will see her in 1 week to titrate further.  -Continued glipizide  -Increased dose of Victoza from 0.6 mg daily to 1.2 mg daily. -Extensively discussed pathophysiology of DM, recommended lifestyle interventions, dietary effects on glycemic control -Counseled on s/sx of and management of hypoglycemia -Next A1C anticipated 08/2018.   Written patient instructions provided.  Total time in face to face counseling 15 minutes.   Follow up Pharmacist Visit in 1 week.     Patient seen with: Marylene Buerger, PharmD Candidate [employer] Class of 2021  Benard Halsted, PharmD, [place] [phone]

## 2018-06-06 ENCOUNTER — Ambulatory Visit: Payer: BLUE CROSS/BLUE SHIELD | Admitting: Pharmacist

## 2018-06-10 ENCOUNTER — Other Ambulatory Visit: Payer: Self-pay

## 2018-06-10 ENCOUNTER — Encounter: Payer: Self-pay | Admitting: Pharmacist

## 2018-06-10 ENCOUNTER — Ambulatory Visit: Payer: BLUE CROSS/BLUE SHIELD | Attending: Family Medicine | Admitting: Pharmacist

## 2018-06-10 ENCOUNTER — Other Ambulatory Visit: Payer: Self-pay | Admitting: Family Medicine

## 2018-06-10 DIAGNOSIS — Z794 Long term (current) use of insulin: Principal | ICD-10-CM

## 2018-06-10 DIAGNOSIS — Z7984 Long term (current) use of oral hypoglycemic drugs: Secondary | ICD-10-CM | POA: Insufficient documentation

## 2018-06-10 DIAGNOSIS — E1165 Type 2 diabetes mellitus with hyperglycemia: Principal | ICD-10-CM

## 2018-06-10 DIAGNOSIS — IMO0001 Reserved for inherently not codable concepts without codable children: Secondary | ICD-10-CM

## 2018-06-10 MED ORDER — MICROLET LANCETS MISC: 1.0000 | Freq: Three times a day (TID) | 12 refills | Status: DC

## 2018-06-10 MED ORDER — CONTOUR NEXT MONITOR W/DEVICE KIT: 1.0000 | PACK | Freq: Three times a day (TID) | 0 refills | Status: DC

## 2018-06-10 MED ORDER — GLUCOSE BLOOD VI STRP: 1.0000 | ORAL_STRIP | Freq: Three times a day (TID) | 12 refills | Status: DC

## 2018-06-10 MED FILL — CONTOUR NEXT METER: W/DEVICE | 30 days supply | Qty: 1 | Fill #0

## 2018-06-10 MED FILL — MICROLET LANCETS MISC: 30 days supply | Qty: 100 | Fill #0

## 2018-06-10 MED FILL — CONTOUR NEXT STRIPS: 30 days supply | Qty: 100 | Fill #0

## 2018-06-10 NOTE — Patient Instructions (Addendum)
Thank you for coming to see me today. Please continue Victoza 1.8 mg daily along with glipizide.   Monitor sugars at home.   If you need me, please call at [phone].

## 2018-06-10 NOTE — Progress Notes (Signed)
    S:   PCP: Dr. Margarita Rana   No chief complaint on file.  Patient arrives in good spirits. Presents for victoza titration and DM management. Patient was referred and last seen by Dr. Margarita Rana on 05/15/18.   Today, patient reports recent NV episode that occurred this past Thursday (06/06/18). Reports that this has resolved and she feels well today. Denies hypoglycemia.    Patient reports adherence with medications.  Current diabetes medications include:  - Glipizide 10 mg: 2 tablets BID - Victoza: 1.2 mg daily. Patient reports taking 1.8 mg daily.   Family/Social History:  - HTN, hyperlipidemia, and heart disease (mother) - Tobacco: former smoker (quit 2009) - Alcohol: denies use  Insurance coverage/medication affordability:  - BCBS  Patient reported dietary habits: Eats 2-3 meals/day -Reports improvement in diet  -Patient states that she's eating more fresh fruits and veggies  -Limits carbs  Patient-reported exercise habits:  - Pt states she's working on obtaining a gym membership   Patient denies nocturia.  Patient denies neuropathy. Patient denies visual changes. Patient denies self foot exams.   O:  Home fasting CBG: 109-130 2 hour post-prandial/random CBG: 87-118  Lab Results  Component Value Date   HGBA1C 11.2 (A) 05/15/2018   There were no vitals filed for this visit.  Lipid Panel     Component Value Date/Time   CHOL 162 02/07/2018 1026   TRIG 118 02/07/2018 1026   HDL 42 02/07/2018 1026   CHOLHDL 3.9 02/07/2018 1026   CHOLHDL 3.9 11/01/2016 1608   VLDL 18 06/16/2010 2054   LDLCALC 96 02/07/2018 1026   LDLDIRECT 131 (H) 04/21/2010 2016   A/P: Diabetes longstanding currently uncontrolled with A1c of 11.2. Of note, reported home sugar levels at goal. Patient is able to verbalize appropriate hypoglycemia management plan. Patient is adherent with medication.   Nausea resolved. At her previous encounter with me, pt was instructed to increase Victoza from 0.6 to  1.2 mg daily. She mistakenly increased to 1.8 mg daily. With resolution of nausea and goal home glucose levels will have her continue 1.8 mg dose. She knows to contact me with any concerns.   -Continued glipizide  -Continued Victoza 1.8 mg daily -Extensively discussed pathophysiology of DM, recommended lifestyle interventions, dietary effects on glycemic control -Counseled on s/sx of and management of hypoglycemia -Next A1C anticipated 08/2018.   Written patient instructions provided.  Total time in face to face counseling 15 minutes.   Follow up PCP visit in 08/20/18.  Patient seen with: Marylene Buerger, PharmD Candidate [employer] Class of 2021  Benard Halsted, PharmD, [place] [phone]

## 2018-08-09 MED FILL — CONTOUR NEXT STRIPS: 30 days supply | Qty: 100 | Fill #1

## 2018-08-09 MED FILL — MICROLET LANCETS MISC: 30 days supply | Qty: 100 | Fill #1

## 2018-08-20 ENCOUNTER — Encounter: Payer: Self-pay | Admitting: Family Medicine

## 2018-08-20 ENCOUNTER — Ambulatory Visit: Payer: BLUE CROSS/BLUE SHIELD | Attending: Family Medicine | Admitting: Family Medicine

## 2018-08-20 VITALS — BP 124/77 | HR 99 | Temp 97.6°F | Ht 66.0 in | Wt 229.6 lb

## 2018-08-20 DIAGNOSIS — E6609 Other obesity due to excess calories: Secondary | ICD-10-CM | POA: Insufficient documentation

## 2018-08-20 DIAGNOSIS — E785 Hyperlipidemia, unspecified: Secondary | ICD-10-CM | POA: Diagnosis not present

## 2018-08-20 DIAGNOSIS — E669 Obesity, unspecified: Secondary | ICD-10-CM | POA: Insufficient documentation

## 2018-08-20 DIAGNOSIS — I10 Essential (primary) hypertension: Secondary | ICD-10-CM

## 2018-08-20 DIAGNOSIS — R062 Wheezing: Secondary | ICD-10-CM

## 2018-08-20 DIAGNOSIS — E11649 Type 2 diabetes mellitus with hypoglycemia without coma: Secondary | ICD-10-CM | POA: Diagnosis not present

## 2018-08-20 DIAGNOSIS — Z23 Encounter for immunization: Secondary | ICD-10-CM

## 2018-08-20 DIAGNOSIS — E1169 Type 2 diabetes mellitus with other specified complication: Secondary | ICD-10-CM | POA: Insufficient documentation

## 2018-08-20 DIAGNOSIS — F329 Major depressive disorder, single episode, unspecified: Secondary | ICD-10-CM | POA: Insufficient documentation

## 2018-08-20 DIAGNOSIS — Z6837 Body mass index (BMI) 37.0-37.9, adult: Secondary | ICD-10-CM | POA: Insufficient documentation

## 2018-08-20 DIAGNOSIS — Z79899 Other long term (current) drug therapy: Secondary | ICD-10-CM | POA: Insufficient documentation

## 2018-08-20 DIAGNOSIS — Z794 Long term (current) use of insulin: Secondary | ICD-10-CM | POA: Insufficient documentation

## 2018-08-20 DIAGNOSIS — Z791 Long term (current) use of non-steroidal anti-inflammatories (NSAID): Secondary | ICD-10-CM | POA: Insufficient documentation

## 2018-08-20 LAB — GLUCOSE, POCT (MANUAL RESULT ENTRY): POC Glucose: 150 mg/dl — AB (ref 70–99)

## 2018-08-20 LAB — POCT GLYCOSYLATED HEMOGLOBIN (HGB A1C): Hemoglobin A1C: 7.4 % — AB (ref 4.0–5.6)

## 2018-08-20 MED ORDER — GLIPIZIDE 10 MG PO TABS: 20.0000 mg | ORAL_TABLET | Freq: Two times a day (BID) | ORAL | 6 refills | Status: DC

## 2018-08-20 MED ORDER — ATORVASTATIN CALCIUM 40 MG PO TABS: 40.0000 mg | ORAL_TABLET | Freq: Every day | ORAL | 6 refills | Status: DC

## 2018-08-20 MED ORDER — LISINOPRIL 40 MG PO TABS: 40.0000 mg | ORAL_TABLET | Freq: Every day | ORAL | 6 refills | Status: DC

## 2018-08-20 MED ORDER — ALBUTEROL SULFATE HFA 108 (90 BASE) MCG/ACT IN AERS: 2.0000 | INHALATION_SPRAY | Freq: Four times a day (QID) | RESPIRATORY_TRACT | 0 refills | Status: DC | PRN

## 2018-08-20 MED ORDER — LIRAGLUTIDE 18 MG/3ML ~~LOC~~ SOPN: 1.8000 mg | PEN_INJECTOR | Freq: Every day | SUBCUTANEOUS | 6 refills | Status: DC

## 2018-08-20 NOTE — Patient Instructions (Signed)

## 2018-08-20 NOTE — Progress Notes (Signed)
Subjective:  Patient ID: Sheila Bishop, female    DOB: [birth year]  Age: 47 y.o. MRN: [id number]  CC: Diabetes   HPI Sheila Bishop  is a 47 year old female with history of type 2 diabetes mellitus (A1c 7.4), hypertension, hyperlipidemia who presents today for a follow-up visit. A1c 7.4 which is down from 11.2 previously.  At her last visit she was commenced on Victoza which she has been compliant with and denies adverse effects from it.  She has in fact lost 9 pounds.  Up-to-date on annual eye exams, denies visual concerns, neuropathy or hypoglycemia. Doing well her antihypertensive and her statin and denies myalgias. She does not exercise regularly but has cut out fried foods and eat lots of vegetables; [facility]. She has noticed over the last 1 week ever since weather change she has been wheezing at night but she denies dyspnea, cough or upper respiratory symptoms; also denies fevers.  Past Medical History:  Diagnosis Date  . Breast abscess Nov 2012   left  . Depression 2007  . Diabetes mellitus 2009  . Headache(784.0)   . Hypertension 2009  . Obesity   . Wears glasses     Past Surgical History:  Procedure Laterality Date  . BREAST BIOPSY  08/07/2011   Procedure: BREAST BIOPSY;  Surgeon: Joyice Faster. Cornett, MD;  Location: [place];  Service: General;  Laterality: Left;  BIOPSY BREAST LEFT SIDE  . CESAREAN SECTION  2002    No Known Allergies   Outpatient Medications Prior to Visit  Medication Sig Dispense Refill  . Blood Glucose Monitoring Suppl (CONTOUR NEXT MONITOR) w/Device KIT 1 each by Does not apply route 3 (three) times daily before meals. 1 kit 0  . cyclobenzaprine (FLEXERIL) 10 MG tablet Take 1 tablet (10 mg total) by mouth at bedtime. 30 tablet 1  . glucose blood (CONTOUR NEXT TEST) test strip 1 each by Other route 3 (three) times daily before meals. Use as instructed 100 each 12  . ibuprofen (ADVIL,MOTRIN) 800 MG  tablet Take 1 tablet (800 mg total) by mouth 3 (three) times daily. 21 tablet 0  . Insulin Pen Needle 32G X 4 MM MISC Use as directed daily to inject victoza 100 each 2  . loperamide (IMODIUM A-D) 2 MG capsule 2 mg Q 6 hrs PRN 12 capsule 0  . MICROLET LANCETS MISC 1 each by Does not apply route 3 (three) times daily before meals. 100 each 12  . ONETOUCH DELICA LANCETS 31R MISC USE AS INSTRUCTED 3 TIMES DAILY BEFORE MEALS 100 each 12  . atorvastatin (LIPITOR) 40 MG tablet Take 1 tablet (40 mg total) by mouth daily. 30 tablet 3  . glipiZIDE (GLUCOTROL) 10 MG tablet Take 2 tablets (20 mg total) by mouth 2 (two) times daily before a meal. 120 tablet 3  . liraglutide (VICTOZA) 18 MG/3ML SOPN Inject 0.3 mLs (1.8 mg total) into the skin daily with breakfast. Start daily with 0.1 mL for 1 week then 0.2 mL for 1 week then 0.43m 3 pen 3  . lisinopril (PRINIVIL,ZESTRIL) 40 MG tablet Take 1 tablet (40 mg total) by mouth daily. 30 tablet 3  . benzonatate (TESSALON) 100 MG capsule Take 1 capsule (100 mg total) by mouth 2 (two) times daily as needed for cough. (Patient not taking: Reported on 01/01/2018) 20 capsule 0   No facility-administered medications prior to visit.     ROS Review of Systems  Constitutional:  Negative for activity change, appetite change and fatigue.  HENT: Negative for congestion, sinus pressure and sore throat.   Eyes: Negative for visual disturbance.  Respiratory: Positive for wheezing. Negative for cough, chest tightness and shortness of breath.   Cardiovascular: Negative for chest pain and palpitations.  Gastrointestinal: Negative for abdominal distention, abdominal pain and constipation.  Endocrine: Negative for polydipsia.  Genitourinary: Negative for dysuria and frequency.  Musculoskeletal: Negative for arthralgias and back pain.  Skin: Negative for rash.  Neurological: Negative for tremors, light-headedness and numbness.  Hematological: Does not bruise/bleed easily.    Psychiatric/Behavioral: Negative for agitation and behavioral problems.    CMP Latest Ref Rng & Units 02/07/2018 12/08/2017 01/04/2017  Glucose 65 - 99 mg/dL 223(H) 134(H) 289(H)  BUN 6 - 24 mg/dL 9 21(H) 11  Creatinine 0.57 - 1.00 mg/dL 0.74 0.69 0.72  Sodium 134 - 144 mmol/L 135 138 138  Potassium 3.5 - 5.2 mmol/L 4.0 3.6 4.0  Chloride 96 - 106 mmol/L 97 105 98  CO2 20 - 29 mmol/L _0 Calcium 8.7 - 10.2 mg/dL 9.3 9.2 9.3  Total Protein 6.0 - 8.5 g/dL 6.9 - 7.5  Total Bilirubin 0.0 - 1.2 mg/dL 0.4 - 0.3  Alkaline Phos 39 - 117 IU/L 119(H) - 97  AST 0 - 40 IU/L 13 - 8  ALT 0 - 32 IU/L 15 - 13    Lipid Panel     Component Value Date/Time   CHOL 162 02/07/2018 1026   TRIG 118 02/07/2018 1026   HDL 42 02/07/2018 1026   CHOLHDL 3.9 02/07/2018 1026   CHOLHDL 3.9 11/01/2016 1608   VLDL 18 06/16/2010 2054   LDLCALC 96 02/07/2018 1026   LDLDIRECT 131 (H) 04/21/2010 2016    Lab Results  Component Value Date   HGBA1C 7.4 (A) 08/20/2018    Objective:  BP 124/77   Pulse 99   Temp 97.6 F (36.4 C) (Oral)   Ht _1  (1.676 m)   Wt 229 lb 9.6 oz (104.1 kg)   SpO2 99%   BMI 37.06 kg/m   BP/Weight 08/20/2018 4/49/6759 10/07/3844  Systolic BP 659 935 701  Diastolic BP 77 88 81  Wt. (Lbs) 229.6 238 225.8  BMI 37.06 38.41 36.45      Physical Exam  Constitutional: She is oriented to person, place, and time. She appears well-developed and well-nourished.  Cardiovascular: Normal rate, normal heart sounds and intact distal pulses.  No murmur heard. Pulmonary/Chest: Effort normal and breath sounds normal. She has no wheezes. She has no rales. She exhibits no tenderness.  Abdominal: Soft. Bowel sounds are normal. She exhibits no distension and no mass. There is no tenderness.  Musculoskeletal: Normal range of motion.  Neurological: She is alert and oriented to person, place, and time.  Skin: Skin is warm and dry.     Assessment & Plan:   1. Type 2 diabetes mellitus with  other specified complication, without long-term current use of insulin (HCC) Improved significantly with A1c of 7.4 which is down from 11.2 previously He has been commended on this improvement Continue current regimen Counseled on Diabetic diet, my plate method, 779 minutes of moderate intensity exercise/week Keep blood sugar logs with fasting goals of 80-120 mg/dl, random of less than 180 and in the event of sugars less than 60 mg/dl or greater than 400 mg/dl please notify the clinic ASAP. It is recommended that you undergo annual eye exams and annual foot exams. Pneumonia vaccine is recommended. -  POCT glucose (manual entry) - POCT glycosylated hemoglobin (Hb A1C) - liraglutide (VICTOZA) 18 MG/3ML SOPN; Inject 0.3 mLs (1.8 mg total) into the skin daily with breakfast.  Dispense: 3 pen; Refill: 6 - glipiZIDE (GLUCOTROL) 10 MG tablet; Take 2 tablets (20 mg total) by mouth 2 (two) times daily before a meal.  Dispense: 120 tablet; Refill: 6  2. Essential hypertension Controlled Counseled on blood pressure goal of less than 130/80, low-sodium, DASH diet, medication compliance, 150 minutes of moderate intensity exercise per week. Discussed medication compliance, adverse effects. - lisinopril (PRINIVIL,ZESTRIL) 40 MG tablet; Take 1 tablet (40 mg total) by mouth daily.  Dispense: 30 tablet; Refill: 6 - Basic Metabolic Panel  3. Dyslipidemia associated with type 2 diabetes mellitus (HCC) Controlled Low-cholesterol diet - atorvastatin (LIPITOR) 40 MG tablet; Take 1 tablet (40 mg total) by mouth daily.  Dispense: 30 tablet; Refill: 6  4. Class 2 obesity due to excess calories without serious comorbidity with body mass index (BMI) of 37.0 to 37.9 in adult She has lost 9 pounds since her last visit [facility] and has been encouraged to do so  5. Wheezing Physical exam is normal Low suspicion for asthma or COPD She will use MDI at night and use an antitussive as well - albuterol  (PROVENTIL HFA;VENTOLIN HFA) 108 (90 Base) MCG/ACT inhaler; Inhale 2 puffs into the lungs every 6 (six) hours as needed for wheezing or shortness of breath.  Dispense: 1 Inhaler; Refill: 0   Meds ordered this encounter  Medications  . lisinopril (PRINIVIL,ZESTRIL) 40 MG tablet    Sig: Take 1 tablet (40 mg total) by mouth daily.    Dispense:  30 tablet    Refill:  6  . liraglutide (VICTOZA) 18 MG/3ML SOPN    Sig: Inject 0.3 mLs (1.8 mg total) into the skin daily with breakfast.    Dispense:  3 pen    Refill:  6  . glipiZIDE (GLUCOTROL) 10 MG tablet    Sig: Take 2 tablets (20 mg total) by mouth 2 (two) times daily before a meal.    Dispense:  120 tablet    Refill:  6    Discontinue previous dose  . atorvastatin (LIPITOR) 40 MG tablet    Sig: Take 1 tablet (40 mg total) by mouth daily.    Dispense:  30 tablet    Refill:  6  . albuterol (PROVENTIL HFA;VENTOLIN HFA) 108 (90 Base) MCG/ACT inhaler    Sig: Inhale 2 puffs into the lungs every 6 (six) hours as needed for wheezing or shortness of breath.    Dispense:  1 Inhaler    Refill:  0    Follow-up: Return in about 6 months (around 02/18/2019) for Follow-up of chronic medical conditions.   Charlott Rakes MD

## 2018-08-20 NOTE — Progress Notes (Signed)
Patient states that at night she is wheezing.

## 2018-08-21 ENCOUNTER — Telehealth: Payer: Self-pay

## 2018-08-21 LAB — BASIC METABOLIC PANEL
BUN/Creatinine Ratio: 32 — ABNORMAL HIGH (ref 9–23)
BUN: 25 mg/dL — AB (ref 6–24)
CHLORIDE: 104 mmol/L (ref 96–106)
CO2: 24 mmol/L (ref 20–29)
Calcium: 9.1 mg/dL (ref 8.7–10.2)
Creatinine, Ser: 0.77 mg/dL (ref 0.57–1.00)
GFR calc Af Amer: 108 mL/min/{1.73_m2} (ref >59)
GFR calc non Af Amer: 94 mL/min/{1.73_m2} (ref >59)
GLUCOSE: 131 mg/dL — AB (ref 65–99)
POTASSIUM: 4 mmol/L (ref 3.5–5.2)
Sodium: 141 mmol/L (ref 134–144)

## 2018-08-21 NOTE — Telephone Encounter (Signed)
-----   Message from Charlott Rakes, MD sent at 08/21/2018  1:28 PM EST ----- Labs are stable

## 2018-08-21 NOTE — Telephone Encounter (Signed)
Patient was called and informed of lab results. 

## 2018-10-04 MED FILL — MICROLET LANCETS MISC: 30 days supply | Qty: 100 | Fill #2

## 2018-10-04 MED FILL — CONTOUR NEXT STRIPS: 30 days supply | Qty: 100 | Fill #2

## 2018-12-09 MED FILL — MICROLET LANCETS MISC: 30 days supply | Qty: 100 | Fill #3

## 2018-12-09 MED FILL — CONTOUR NEXT STRIPS: 30 days supply | Qty: 100 | Fill #3

## 2019-01-27 MED FILL — MICROLET LANCETS MISC: 30 days supply | Qty: 100 | Fill #4

## 2019-01-27 MED FILL — CONTOUR NEXT STRIPS: 30 days supply | Qty: 100 | Fill #4

## 2019-02-12 ENCOUNTER — Ambulatory Visit: Payer: BLUE CROSS/BLUE SHIELD | Attending: Family Medicine | Admitting: Family Medicine

## 2019-02-12 ENCOUNTER — Encounter: Payer: Self-pay | Admitting: Family Medicine

## 2019-02-12 ENCOUNTER — Other Ambulatory Visit: Payer: Self-pay

## 2019-02-12 VITALS — BP 136/81 | HR 91 | Temp 98.0°F | Ht 66.0 in | Wt 232.2 lb

## 2019-02-12 DIAGNOSIS — E6609 Other obesity due to excess calories: Secondary | ICD-10-CM

## 2019-02-12 DIAGNOSIS — I10 Essential (primary) hypertension: Secondary | ICD-10-CM | POA: Diagnosis not present

## 2019-02-12 DIAGNOSIS — E1169 Type 2 diabetes mellitus with other specified complication: Secondary | ICD-10-CM

## 2019-02-12 DIAGNOSIS — E785 Hyperlipidemia, unspecified: Secondary | ICD-10-CM | POA: Diagnosis not present

## 2019-02-12 DIAGNOSIS — Z6837 Body mass index (BMI) 37.0-37.9, adult: Secondary | ICD-10-CM

## 2019-02-12 DIAGNOSIS — E11649 Type 2 diabetes mellitus with hypoglycemia without coma: Secondary | ICD-10-CM

## 2019-02-12 LAB — POCT GLYCOSYLATED HEMOGLOBIN (HGB A1C): HbA1c, POC (controlled diabetic range): 7.6 % — AB (ref 0.0–7.0)

## 2019-02-12 LAB — GLUCOSE, POCT (MANUAL RESULT ENTRY): POC Glucose: 93 mg/dl (ref 70–99)

## 2019-02-12 MED ORDER — GLIPIZIDE 10 MG PO TABS: 20.0000 mg | ORAL_TABLET | Freq: Two times a day (BID) | ORAL | 6 refills | Status: DC

## 2019-02-12 MED ORDER — LISINOPRIL 40 MG PO TABS: 40.0000 mg | ORAL_TABLET | Freq: Every day | ORAL | 6 refills | Status: DC

## 2019-02-12 MED ORDER — LIRAGLUTIDE 18 MG/3ML ~~LOC~~ SOPN: 1.8000 mg | PEN_INJECTOR | Freq: Every day | SUBCUTANEOUS | 6 refills | Status: DC

## 2019-02-12 MED ORDER — ATORVASTATIN CALCIUM 40 MG PO TABS: 40.0000 mg | ORAL_TABLET | Freq: Every day | ORAL | 6 refills | Status: DC

## 2019-02-12 NOTE — Patient Instructions (Signed)

## 2019-02-12 NOTE — Progress Notes (Signed)
Subjective:  Patient ID: Sheila Bishop, female    DOB: 1971-03-02  Age: 48 y.o. MRN: [id number]  CC: Diabetes   HPI Sheila Bishop Sheila Bishop  is a 48 year old female with history of type 2 diabetes mellitus (A1c 7.6), hypertension, hyperlipidemia who presents today for a follow-up visit.  A1c 7.6 which has trended up slightly from 7.4 previously and she endorses compliance with her medications but has not been exercising as much.  She also is aware she needs to lose weight.  She had an annual eye exam last year and denies blurry vision.  Denies paresthesia in her extremities. Compliant with her antihypertensive and denies adverse effect from her medication and she is doing well on her statin with no complaints of myalgias. [place].  She has no additional concerns today.  Past Medical History:  Diagnosis Date  . Breast abscess Nov 2012   left  . Depression 2007  . Diabetes mellitus 2009  . Headache(784.0)   . Hypertension 2009  . Obesity   . Wears glasses     Past Surgical History:  Procedure Laterality Date  . BREAST BIOPSY  08/07/2011   Procedure: BREAST BIOPSY;  Surgeon: Joyice Faster. Cornett, MD;  Location: [place];  Service: General;  Laterality: Left;  BIOPSY BREAST LEFT SIDE  . CESAREAN SECTION  2002    Family History  Problem Relation Age of Onset  . Hyperlipidemia Mother   . Hypertension Mother   . Heart disease Mother     No Known Allergies  Outpatient Medications Prior to Visit  Medication Sig Dispense Refill  . albuterol (PROVENTIL HFA;VENTOLIN HFA) 108 (90 Base) MCG/ACT inhaler Inhale 2 puffs into the lungs every 6 (six) hours as needed for wheezing or shortness of breath. 1 Inhaler 0  . atorvastatin (LIPITOR) 40 MG tablet Take 1 tablet (40 mg total) by mouth daily. 30 tablet 6  . Blood Glucose Monitoring Suppl (CONTOUR NEXT MONITOR) w/Device KIT 1 each by Does not apply route 3 (three) times daily  before meals. 1 kit 0  . cyclobenzaprine (FLEXERIL) 10 MG tablet Take 1 tablet (10 mg total) by mouth at bedtime. 30 tablet 1  . glipiZIDE (GLUCOTROL) 10 MG tablet Take 2 tablets (20 mg total) by mouth 2 (two) times daily before a meal. 120 tablet 6  . glucose blood (CONTOUR NEXT TEST) test strip 1 each by Other route 3 (three) times daily before meals. Use as instructed 100 each 12  . ibuprofen (ADVIL,MOTRIN) 800 MG tablet Take 1 tablet (800 mg total) by mouth 3 (three) times daily. 21 tablet 0  . Insulin Pen Needle 32G X 4 MM MISC Use as directed daily to inject victoza 100 each 2  . liraglutide (VICTOZA) 18 MG/3ML SOPN Inject 0.3 mLs (1.8 mg total) into the skin daily with breakfast. 3 pen 6  . lisinopril (PRINIVIL,ZESTRIL) 40 MG tablet Take 1 tablet (40 mg total) by mouth daily. 30 tablet 6  . loperamide (IMODIUM A-D) 2 MG capsule 2 mg Q 6 hrs PRN 12 capsule 0  . MICROLET LANCETS MISC 1 each by Does not apply route 3 (three) times daily before meals. 100 each 12  . ONETOUCH DELICA LANCETS 36R MISC USE AS INSTRUCTED 3 TIMES DAILY BEFORE MEALS 100 each 12  . benzonatate (TESSALON) 100 MG capsule Take 1 capsule (100 mg total) by mouth 2 (two) times daily as needed for cough. (Patient not taking: Reported  on 01/01/2018) 20 capsule 0   No facility-administered medications prior to visit.      ROS Review of Systems  Constitutional: Negative for activity change, appetite change and fatigue.  HENT: Negative for congestion, sinus pressure and sore throat.   Eyes: Negative for visual disturbance.  Respiratory: Negative for cough, chest tightness, shortness of breath and wheezing.   Cardiovascular: Negative for chest pain and palpitations.  Gastrointestinal: Negative for abdominal distention, abdominal pain and constipation.  Endocrine: Negative for polydipsia.  Genitourinary: Negative for dysuria and frequency.  Musculoskeletal: Negative for arthralgias and back pain.  Skin: Negative for rash.   Neurological: Negative for tremors, light-headedness and numbness.  Hematological: Does not bruise/bleed easily.  Psychiatric/Behavioral: Negative for agitation and behavioral problems.    Objective:  BP 136/81   Pulse 91   Temp 98 F (36.7 C) (Oral)   Ht '5\' 6"'$  (1.676 m)   Wt 232 lb 3.2 oz (105.3 kg)   SpO2 98%   BMI 37.48 kg/m   BP/Weight 02/12/2019 08/20/2018 0/94/7096  Systolic BP 283 662 947  Diastolic BP 81 77 88  Wt. (Lbs) 232.2 229.6 238  BMI 37.48 37.06 38.41      Physical Exam Constitutional:      Appearance: She is well-developed.  Cardiovascular:     Rate and Rhythm: Normal rate.     Heart sounds: Normal heart sounds. No murmur.  Pulmonary:     Effort: Pulmonary effort is normal.     Breath sounds: Normal breath sounds. No wheezing or rales.  Chest:     Chest wall: No tenderness.  Abdominal:     General: Bowel sounds are normal. There is no distension.     Palpations: Abdomen is soft. There is no mass.     Tenderness: There is no abdominal tenderness.  Musculoskeletal: Normal range of motion.  Neurological:     Mental Status: She is alert and oriented to person, place, and time.     CMP Latest Ref Rng & Units 08/20/2018 02/07/2018 12/08/2017  Glucose 65 - 99 mg/dL 131(H) 223(H) 134(H)  BUN 6 - 24 mg/dL 25(H) 9 21(H)  Creatinine 0.57 - 1.00 mg/dL 0.77 0.74 0.69  Sodium 134 - 144 mmol/L 141 135 138  Potassium 3.5 - 5.2 mmol/L 4.0 4.0 3.6  Chloride 96 - 106 mmol/L 104 97 105  CO2 20 - 29 mmol/L '24 24 26  '$ Calcium 8.7 - 10.2 mg/dL 9.1 9.3 9.2  Total Protein 6.0 - 8.5 g/dL - 6.9 -  Total Bilirubin 0.0 - 1.2 mg/dL - 0.4 -  Alkaline Phos 39 - 117 IU/L - 119(H) -  AST 0 - 40 IU/L - 13 -  ALT 0 - 32 IU/L - 15 -    Lipid Panel     Component Value Date/Time   CHOL 162 02/07/2018 1026   TRIG 118 02/07/2018 1026   HDL 42 02/07/2018 1026   CHOLHDL 3.9 02/07/2018 1026   CHOLHDL 3.9 11/01/2016 1608   VLDL 18 06/16/2010 2054   LDLCALC 96 02/07/2018 1026    LDLDIRECT 131 (H) 04/21/2010 2016    CBC    Component Value Date/Time   WBC 14.6 (H) 12/08/2017 2135   RBC 3.91 12/08/2017 2135   HGB 10.2 (L) 12/08/2017 2135   HGB 10.5 (L) 01/04/2017 1131   HCT 30.8 (L) 12/08/2017 2135   HCT 31.6 (L) 01/04/2017 1131   PLT 330 12/08/2017 2135   PLT 326 01/04/2017 1131   MCV 78.8 12/08/2017 2135  MCV 80 01/04/2017 1131   MCH 26.1 12/08/2017 2135   MCHC 33.1 12/08/2017 2135   RDW 13.9 12/08/2017 2135   RDW 13.8 01/04/2017 1131   LYMPHSABS 2,684 02/23/2016 1052   MONOABS 610 02/23/2016 1052   EOSABS 122 02/23/2016 1052   BASOSABS 0 02/23/2016 1052    Lab Results  Component Value Date   HGBA1C 7.6 (A) 02/12/2019    Assessment & Plan:   1. Type 2 diabetes mellitus with other specified complication, without long-term current use of insulin (HCC) A1c of 7.6 which is slightly above goal of less than 7.0 No regimen change today but will allow her to work on her lifestyle Counseled on Diabetic diet, my plate method, 331 minutes of moderate intensity exercise/week Keep blood sugar logs with fasting goals of 80-120 mg/dl, random of less than 180 and in the event of sugars less than 60 mg/dl or greater than 400 mg/dl please notify the clinic ASAP. It is recommended that you undergo annual eye exams and annual foot exams. Pneumonia vaccine is recommended. She will bring in information regarding her previous eye exam for this to be updated - POCT glucose (manual entry) - POCT glycosylated hemoglobin (Hb A1C) - glipiZIDE (GLUCOTROL) 10 MG tablet; Take 2 tablets (20 mg total) by mouth 2 (two) times daily before a meal.  Dispense: 120 tablet; Refill: 6 - liraglutide (VICTOZA) 18 MG/3ML SOPN; Inject 0.3 mLs (1.8 mg total) into the skin daily with breakfast.  Dispense: 3 pen; Refill: 6 - CMP14+EGFR  2. Essential hypertension Controlled Counseled on blood pressure goal of less than 130/80, low-sodium, DASH diet, medication compliance, 150 minutes of  moderate intensity exercise per week. Discussed medication compliance, adverse effects. - lisinopril (ZESTRIL) 40 MG tablet; Take 1 tablet (40 mg total) by mouth daily.  Dispense: 30 tablet; Refill: 6  3. Dyslipidemia associated with type 2 diabetes mellitus (HCC) Controlled Low-cholesterol diet - atorvastatin (LIPITOR) 40 MG tablet; Take 1 tablet (40 mg total) by mouth daily.  Dispense: 30 tablet; Refill: 6  4. Class 2 obesity due to excess calories without serious comorbidity with body mass index (BMI) of 37.0 to 37.9 in adult Discussed increasing physical activity, reducing portion sizes   Return in about 3 months (around 05/15/2019) for medical conditions.    Charlott Rakes, MD, FAAFP. [facility] [place], [place]   02/12/2019, 2:13 PM

## 2019-02-13 ENCOUNTER — Telehealth: Payer: Self-pay

## 2019-02-13 LAB — CMP14+EGFR
ALT: 13 IU/L (ref 0–32)
AST: 12 IU/L (ref 0–40)
Albumin/Globulin Ratio: 1.5 (ref 1.2–2.2)
Albumin: 4 g/dL (ref 3.8–4.8)
Alkaline Phosphatase: 105 IU/L (ref 39–117)
BUN/Creatinine Ratio: 30 — ABNORMAL HIGH (ref 9–23)
BUN: 22 mg/dL (ref 6–24)
Bilirubin Total: 0.3 mg/dL (ref 0.0–1.2)
CO2: 24 mmol/L (ref 20–29)
Calcium: 9.2 mg/dL (ref 8.7–10.2)
Chloride: 101 mmol/L (ref 96–106)
Creatinine, Ser: 0.73 mg/dL (ref 0.57–1.00)
GFR calc Af Amer: 115 mL/min/{1.73_m2} (ref >59)
GFR calc non Af Amer: 100 mL/min/{1.73_m2} (ref >59)
Globulin, Total: 2.7 g/dL (ref 1.5–4.5)
Glucose: 73 mg/dL (ref 65–99)
Potassium: 3.9 mmol/L (ref 3.5–5.2)
Sodium: 139 mmol/L (ref 134–144)
Total Protein: 6.7 g/dL (ref 6.0–8.5)

## 2019-02-13 NOTE — Telephone Encounter (Signed)
Patient name and DOB has been verified Patient was informed of lab results. Patient had no questions.  

## 2019-02-13 NOTE — Telephone Encounter (Signed)
-----   Message from Charlott Rakes, MD sent at 02/13/2019  2:44 PM EDT ----- Please inform the patient that labs are normal. Thank you.

## 2019-02-20 ENCOUNTER — Telehealth: Payer: Self-pay

## 2019-02-20 NOTE — Telephone Encounter (Signed)
-----   Message from Charlott Rakes, MD sent at 02/13/2019  2:44 PM EDT ----- Please inform the patient that labs are normal. Thank you.

## 2019-02-20 NOTE — Telephone Encounter (Signed)
Patient was called and informed of lab results. 

## 2019-03-28 MED FILL — MICROLET LANCETS MISC: 30 days supply | Qty: 100 | Fill #5

## 2019-03-28 MED FILL — CONTOUR NEXT STRIPS: 30 days supply | Qty: 100 | Fill #5

## 2019-04-16 ENCOUNTER — Other Ambulatory Visit: Payer: Self-pay | Admitting: Family Medicine

## 2019-04-16 DIAGNOSIS — E1165 Type 2 diabetes mellitus with hyperglycemia: Secondary | ICD-10-CM

## 2019-05-19 ENCOUNTER — Other Ambulatory Visit: Payer: Self-pay

## 2019-05-19 ENCOUNTER — Encounter: Payer: Self-pay | Admitting: Family Medicine

## 2019-05-19 ENCOUNTER — Ambulatory Visit: Payer: BC Managed Care – PPO | Attending: Family Medicine | Admitting: Family Medicine

## 2019-05-19 ENCOUNTER — Other Ambulatory Visit: Payer: Self-pay | Admitting: Pharmacist

## 2019-05-19 VITALS — BP 130/85 | HR 98 | Temp 98.2°F | Ht 66.0 in | Wt 231.8 lb

## 2019-05-19 DIAGNOSIS — Z6837 Body mass index (BMI) 37.0-37.9, adult: Secondary | ICD-10-CM

## 2019-05-19 DIAGNOSIS — E785 Hyperlipidemia, unspecified: Secondary | ICD-10-CM

## 2019-05-19 DIAGNOSIS — N938 Other specified abnormal uterine and vaginal bleeding: Secondary | ICD-10-CM | POA: Diagnosis not present

## 2019-05-19 DIAGNOSIS — E1169 Type 2 diabetes mellitus with other specified complication: Secondary | ICD-10-CM

## 2019-05-19 DIAGNOSIS — N939 Abnormal uterine and vaginal bleeding, unspecified: Secondary | ICD-10-CM

## 2019-05-19 DIAGNOSIS — I10 Essential (primary) hypertension: Secondary | ICD-10-CM | POA: Diagnosis not present

## 2019-05-19 LAB — POCT GLYCOSYLATED HEMOGLOBIN (HGB A1C): HbA1c, POC (controlled diabetic range): 7.6 % — AB (ref 0.0–7.0)

## 2019-05-19 LAB — GLUCOSE, POCT (MANUAL RESULT ENTRY): POC Glucose: 110 mg/dl — AB (ref 70–99)

## 2019-05-19 MED ORDER — GLIPIZIDE 10 MG PO TABS: 20.0000 mg | ORAL_TABLET | Freq: Two times a day (BID) | ORAL | 6 refills | Status: DC

## 2019-05-19 MED ORDER — MICROLET LANCETS MISC: 11 refills | Status: AC

## 2019-05-19 MED ORDER — VICTOZA 18 MG/3ML ~~LOC~~ SOPN: 1.8000 mg | PEN_INJECTOR | Freq: Every day | SUBCUTANEOUS | 6 refills | Status: DC

## 2019-05-19 MED ORDER — LISINOPRIL 40 MG PO TABS: 40.0000 mg | ORAL_TABLET | Freq: Every day | ORAL | 6 refills | Status: DC

## 2019-05-19 MED ORDER — ONETOUCH DELICA LANCETS 33G MISC: 12 refills | Status: DC

## 2019-05-19 MED ORDER — CONTOUR NEXT TEST VI STRP: 1.0000 | ORAL_STRIP | Freq: Three times a day (TID) | 12 refills | Status: DC

## 2019-05-19 MED ORDER — ATORVASTATIN CALCIUM 40 MG PO TABS: 40.0000 mg | ORAL_TABLET | Freq: Every day | ORAL | 6 refills | Status: DC

## 2019-05-19 MED FILL — CONTOUR NEXT TEST STRP: 33 days supply | Qty: 100 | Fill #0

## 2019-05-19 NOTE — Progress Notes (Signed)
Patient states that she has not had a menstrual cycle in June had cycle in July and stayed on until Aug with very heavy bleeding.

## 2019-05-19 NOTE — Progress Notes (Signed)
Subjective:  Patient ID: Sheila Bishop, female    DOB: [birth year]  Age: 48 y.o. MRN: [id number]  CC: Diabetes   HPI Sheila Bishop  is a 48 year old female with history of type 2 diabetes mellitus (A1c 7.6), hypertension, hyperlipidemia who presents today for a follow-up visit. She has noticed slight tenderness in the tip of her left big toe and admits to standing for prolonged periods at her place of work; she works in Fish farm manager at [employer]. She complains of abnormal menstrual cycles.  She missed her period  In 11/2018 and after that normal cycles resumed only for her to miss it again in 03/2019 and the it return between 04/30/2019 and 05/30/2019 but this was associated with heavy bleeding and clots.  She informs me she has been evaluated for fibroids in the past and this was negative.  At this time her menstruation has ceased. In 01/2018 she had a Pap smear which revealed: Normal cytology, HPV detected but negative for high risk HPV and she was advised to have repeat Pap smear in 1 year.  Her diabetes has been stable and she denies hypoglycemia, numbness in extremities, visual concerns and is up-to-date on her annual eye exam but is not sure of the date of her eye exam. Compliant with her statin and antihypertensive and denies adverse effect from her medications.  Past Medical History:  Diagnosis Date  . Breast abscess Nov 2012   left  . Depression 2007  . Diabetes mellitus 2009  . Headache(784.0)   . Hypertension 2009  . Obesity   . Wears glasses     Past Surgical History:  Procedure Laterality Date  . BREAST BIOPSY  08/07/2011   Procedure: BREAST BIOPSY;  Surgeon: Joyice Faster. Cornett, MD;  Location: [facility];  Service: General;  Laterality: Left;  BIOPSY BREAST LEFT SIDE  . CESAREAN SECTION  2002    Family History  Problem Relation Age of Onset  . Hyperlipidemia Mother   . Hypertension Mother   . Heart disease Mother     No Known Allergies  Outpatient Medications  Prior to Visit  Medication Sig Dispense Refill  . albuterol (PROVENTIL HFA;VENTOLIN HFA) 108 (90 Base) MCG/ACT inhaler Inhale 2 puffs into the lungs every 6 (six) hours as needed for wheezing or shortness of breath. 1 Inhaler 0  . Blood Glucose Monitoring Suppl (CONTOUR NEXT MONITOR) w/Device KIT 1 each by Does not apply route 3 (three) times daily before meals. 1 kit 0  . cyclobenzaprine (FLEXERIL) 10 MG tablet Take 1 tablet (10 mg total) by mouth at bedtime. 30 tablet 1  . ibuprofen (ADVIL,MOTRIN) 800 MG tablet Take 1 tablet (800 mg total) by mouth 3 (three) times daily. 21 tablet 0  . Insulin Pen Needle (BD PEN NEEDLE NANO U/F) 32G X 4 MM MISC USE AS DIRECTED TO  INJECT  VICTOZA 100 each 4  . loperamide (IMODIUM A-D) 2 MG capsule 2 mg Q 6 hrs PRN 12 capsule 0  . MICROLET LANCETS MISC 1 each by Does not apply route 3 (three) times daily before meals. 100 each 12  . atorvastatin (LIPITOR) 40 MG tablet Take 1 tablet (40 mg total) by mouth daily. 30 tablet 6  . glipiZIDE (GLUCOTROL) 10 MG tablet Take 2 tablets (20 mg total) by mouth 2 (two) times daily before a meal. 120 tablet 6  . glucose blood (CONTOUR NEXT TEST) test strip 1 each by Other route 3 (three) times daily before meals. Use as instructed  100 each 12  . liraglutide (VICTOZA) 18 MG/3ML SOPN Inject 0.3 mLs (1.8 mg total) into the skin daily with breakfast. 3 pen 6  . lisinopril (ZESTRIL) 40 MG tablet Take 1 tablet (40 mg total) by mouth daily. 30 tablet 6  . ONETOUCH DELICA LANCETS 30S MISC USE AS INSTRUCTED 3 TIMES DAILY BEFORE MEALS 100 each 12  . benzonatate (TESSALON) 100 MG capsule Take 1 capsule (100 mg total) by mouth 2 (two) times daily as needed for cough. (Patient not taking: Reported on 01/01/2018) 20 capsule 0   No facility-administered medications prior to visit.      ROS Review of Systems  Constitutional: Negative for activity change, appetite change and fatigue.  HENT: Negative for congestion, sinus pressure and sore  throat.   Eyes: Negative for visual disturbance.  Respiratory: Negative for cough, chest tightness, shortness of breath and wheezing.   Cardiovascular: Negative for chest pain and palpitations.  Gastrointestinal: Negative for abdominal distention, abdominal pain and constipation.  Endocrine: Negative for polydipsia.  Genitourinary: Negative for dysuria and frequency.  Musculoskeletal: Negative for arthralgias and back pain.  Skin: Negative for rash.  Neurological: Negative for tremors, light-headedness and numbness.  Hematological: Does not bruise/bleed easily.  Psychiatric/Behavioral: Negative for agitation and behavioral problems.    Objective:  BP 130/85   Pulse 98   Temp 98.2 F (36.8 C) (Oral)   Ht '5\' 6"'$  (1.676 m)   Wt 231 lb 12.8 oz (105.1 kg)   SpO2 98%   BMI 37.41 kg/m   BP/Weight 05/19/2019 02/12/2019 92/33/0076  Systolic BP 226 333 545  Diastolic BP 85 81 77  Wt. (Lbs) 231.8 232.2 229.6  BMI 37.41 37.48 37.06      Physical Exam Constitutional:      Appearance: She is well-developed. She is obese.  Cardiovascular:     Rate and Rhythm: Normal rate.     Heart sounds: Normal heart sounds. No murmur.  Pulmonary:     Effort: Pulmonary effort is normal.     Breath sounds: Normal breath sounds. No wheezing or rales.  Chest:     Chest wall: No tenderness.  Abdominal:     General: Bowel sounds are normal. There is no distension.     Palpations: Abdomen is soft. There is no mass.     Tenderness: There is no abdominal tenderness.  Musculoskeletal: Normal range of motion.     Comments: L bigtoe ingrown toenail with slight tenderness, no erythema  Neurological:     Mental Status: She is alert and oriented to person, place, and time.     CMP Latest Ref Rng & Units 02/12/2019 08/20/2018 02/07/2018  Glucose 65 - 99 mg/dL 73 131(H) 223(H)  BUN 6 - 24 mg/dL 22 25(H) 9  Creatinine 0.57 - 1.00 mg/dL 0.73 0.77 0.74  Sodium 134 - 144 mmol/L 139 141 135  Potassium 3.5 - 5.2  mmol/L 3.9 4.0 4.0  Chloride 96 - 106 mmol/L 101 104 97  CO2 20 - 29 mmol/L '24 24 24  '$ Calcium 8.7 - 10.2 mg/dL 9.2 9.1 9.3  Total Protein 6.0 - 8.5 g/dL 6.7 - 6.9  Total Bilirubin 0.0 - 1.2 mg/dL 0.3 - 0.4  Alkaline Phos 39 - 117 IU/L 105 - 119(H)  AST 0 - 40 IU/L 12 - 13  ALT 0 - 32 IU/L 13 - 15    Lipid Panel     Component Value Date/Time   CHOL 162 02/07/2018 1026   TRIG 118 02/07/2018 1026  HDL 42 02/07/2018 1026   CHOLHDL 3.9 02/07/2018 1026   CHOLHDL 3.9 11/01/2016 1608   VLDL 18 06/16/2010 2054   LDLCALC 96 02/07/2018 1026   LDLDIRECT 131 (H) 04/21/2010 2016    CBC    Component Value Date/Time   WBC 14.6 (H) 12/08/2017 2135   RBC 3.91 12/08/2017 2135   HGB 10.2 (L) 12/08/2017 2135   HGB 10.5 (L) 01/04/2017 1131   HCT 30.8 (L) 12/08/2017 2135   HCT 31.6 (L) 01/04/2017 1131   PLT 330 12/08/2017 2135   PLT 326 01/04/2017 1131   MCV 78.8 12/08/2017 2135   MCV 80 01/04/2017 1131   MCH 26.1 12/08/2017 2135   MCHC 33.1 12/08/2017 2135   RDW 13.9 12/08/2017 2135   RDW 13.8 01/04/2017 1131   LYMPHSABS 2,684 02/23/2016 1052   MONOABS 610 02/23/2016 1052   EOSABS 122 02/23/2016 1052   BASOSABS 0 02/23/2016 1052    Lab Results  Component Value Date   HGBA1C 7.6 (A) 05/19/2019    Assessment & Plan:   1. Type 2 diabetes mellitus with other specified complication, without long-term current use of insulin (HCC) A1c of 7.6 which has improved significantly but is less than goal of 7.0 Advised to work on lifestyle modifications, no regimen changes today Referred to podiatry for ingrown toenail Counseled on Diabetic diet, my plate method, 619 minutes of moderate intensity exercise/week Keep blood sugar logs with fasting goals of 80-120 mg/dl, random of less than 180 and in the event of sugars less than 60 mg/dl or greater than 400 mg/dl please notify the clinic ASAP. It is recommended that you undergo annual eye exams and annual foot exams. Pneumonia vaccine is  recommended. - POCT glucose (manual entry) - POCT glycosylated hemoglobin (Hb A1C) - glucose blood (CONTOUR NEXT TEST) test strip; 1 each by Other route 3 (three) times daily before meals. Use as instructed  Dispense: 100 each; Refill: 12 - OneTouch Delica Lancets 50D MISC; USE AS INSTRUCTED 3 TIMES DAILY BEFORE MEALS  Dispense: 100 each; Refill: 12 - liraglutide (VICTOZA) 18 MG/3ML SOPN; Inject 0.3 mLs (1.8 mg total) into the skin daily with breakfast.  Dispense: 3 pen; Refill: 6 - glipiZIDE (GLUCOTROL) 10 MG tablet; Take 2 tablets (20 mg total) by mouth 2 (two) times daily before a meal.  Dispense: 120 tablet; Refill: 6 - Ambulatory referral to [facility] / creatinine urine ratio - Lipid panel - Basic Metabolic Panel  2. Dyslipidemia associated with type 2 diabetes mellitus (HCC) Controlled Low-cholesterol diet - atorvastatin (LIPITOR) 40 MG tablet; Take 1 tablet (40 mg total) by mouth daily.  Dispense: 30 tablet; Refill: 6  3. Essential hypertension Controlled Counseled on blood pressure goal of less than 130/80, low-sodium, DASH diet, medication compliance, 150 minutes of moderate intensity exercise per week. Discussed medication compliance, adverse effects. - lisinopril (ZESTRIL) 40 MG tablet; Take 1 tablet (40 mg total) by mouth daily.  Dispense: 30 tablet; Refill: 6  4. Abnormal uterine bleeding Irregular bleeding likely perimenopausal She is due for Pap smear as last Pap smear from 01/2018 revealed normal cytology, HPV positive but negative for high risk HPV She prefers to schedule this at her next visit  5. Class 2 severe obesity due to excess calories with serious comorbidity and body mass index (BMI) of 37.0 to 37.9 in adult [facility]) Counseled on 150 minutes of exercise per week, healthy eating (including decreased daily intake of saturated fats, cholesterol, added sugars, sodium).   Health Care Maintenance:  See #4 above  Meds ordered this encounter  Medications   . glucose blood (CONTOUR NEXT TEST) test strip    Sig: 1 each by Other route 3 (three) times daily before meals. Use as instructed    Dispense:  100 each    Refill:  12  . OneTouch Delica Lancets 48W MISC    Sig: USE AS INSTRUCTED 3 TIMES DAILY BEFORE MEALS    Dispense:  100 each    Refill:  12  . liraglutide (VICTOZA) 18 MG/3ML SOPN    Sig: Inject 0.3 mLs (1.8 mg total) into the skin daily with breakfast.    Dispense:  3 pen    Refill:  6  . glipiZIDE (GLUCOTROL) 10 MG tablet    Sig: Take 2 tablets (20 mg total) by mouth 2 (two) times daily before a meal.    Dispense:  120 tablet    Refill:  6    Discontinue previous dose  . atorvastatin (LIPITOR) 40 MG tablet    Sig: Take 1 tablet (40 mg total) by mouth daily.    Dispense:  30 tablet    Refill:  6  . lisinopril (ZESTRIL) 40 MG tablet    Sig: Take 1 tablet (40 mg total) by mouth daily.    Dispense:  30 tablet    Refill:  6    Follow-up: Return in about 1 month (around 06/19/2019) for PAP smear.       Charlott Rakes, MD, FAAFP. [facility] and [place], [place]   05/19/2019, 2:08 PM

## 2019-05-19 NOTE — Patient Instructions (Signed)
° °Calorie Counting for Weight Loss °Calories are units of energy. Your body needs a certain amount of calories from food to keep you going throughout the day. When you eat more calories than your body needs, your body stores the extra calories as fat. When you eat fewer calories than your body needs, your body burns fat to get the energy it needs. °Calorie counting means keeping track of how many calories you eat and drink each day. Calorie counting can be helpful if you need to lose weight. If you make sure to eat fewer calories than your body needs, you should lose weight. Ask your health care provider what a healthy weight is for you. °For calorie counting to work, you will need to eat the right number of calories in a day in order to lose a healthy amount of weight per week. A dietitian can help you determine how many calories you need in a day and will give you suggestions on how to reach your calorie goal. °· A healthy amount of weight to lose per week is usually 1-2 lb (0.5-0.9 kg). This usually means that your daily calorie intake should be reduced by 500-750 calories. °· Eating 1,200 - 1,500 calories per day can help most women lose weight. °· Eating 1,500 - 1,800 calories per day can help most men lose weight. °What is my plan? °My goal is to have __________ calories per day. °If I have this many calories per day, I should lose around __________ pounds per week. °What do I need to know about calorie counting? °In order to meet your daily calorie goal, you will need to: °· Find out how many calories are in each food you would like to eat. Try to do this before you eat. °· Decide how much of the food you plan to eat. °· Write down what you ate and how many calories it had. Doing this is called keeping a food log. °To successfully lose weight, it is important to balance calorie counting with a healthy lifestyle that includes regular activity. Aim for 150 minutes of moderate exercise (such as walking) or 75  minutes of vigorous exercise (such as running) each week. °Where do I find calorie information? ° °The number of calories in a food can be found on a Nutrition Facts label. If a food does not have a Nutrition Facts label, try to look up the calories online or ask your dietitian for help. °Remember that calories are listed per serving. If you choose to have more than one serving of a food, you will have to multiply the calories per serving by the amount of servings you plan to eat. For example, the label on a package of bread might say that a serving size is 1 slice and that there are 90 calories in a serving. If you eat 1 slice, you will have eaten 90 calories. If you eat 2 slices, you will have eaten 180 calories. °How do I keep a food log? °Immediately after each meal, record the following information in your food log: °· What you ate. Don't forget to include toppings, sauces, and other extras on the food. °· How much you ate. This can be measured in cups, ounces, or number of items. °· How many calories each food and drink had. °· The total number of calories in the meal. °Keep your food log near you, such as in a small notebook in your pocket, or use a mobile app or website. Some programs will   calculate calories for you and show you how many calories you have left for the day to meet your goal. °What are some calorie counting tips? ° °· Use your calories on foods and drinks that will fill you up and not leave you hungry: °? Some examples of foods that fill you up are nuts and nut butters, vegetables, lean proteins, and high-fiber foods like whole grains. High-fiber foods are foods with more than 5 g fiber per serving. °? Drinks such as sodas, specialty coffee drinks, alcohol, and juices have a lot of calories, yet do not fill you up. °· Eat nutritious foods and avoid empty calories. Empty calories are calories you get from foods or beverages that do not have many vitamins or protein, such as candy, sweets, and  soda. It is better to have a nutritious high-calorie food (such as an avocado) than a food with few nutrients (such as a bag of chips). °· Know how many calories are in the foods you eat most often. This will help you calculate calorie counts faster. °· Pay attention to calories in drinks. Low-calorie drinks include water and unsweetened drinks. °· Pay attention to nutrition labels for "low fat" or "fat free" foods. These foods sometimes have the same amount of calories or more calories than the full fat versions. They also often have added sugar, starch, or salt, to make up for flavor that was removed with the fat. °· Find a way of tracking calories that works for you. Get creative. Try different apps or programs if writing down calories does not work for you. °What are some portion control tips? °· Know how many calories are in a serving. This will help you know how many servings of a certain food you can have. °· Use a measuring cup to measure serving sizes. You could also try weighing out portions on a kitchen scale. With time, you will be able to estimate serving sizes for some foods. °· Take some time to put servings of different foods on your favorite plates, bowls, and cups so you know what a serving looks like. °· Try not to eat straight from a bag or box. Doing this can lead to overeating. Put the amount you would like to eat in a cup or on a plate to make sure you are eating the right portion. °· Use smaller plates, glasses, and bowls to prevent overeating. °· Try not to multitask (for example, watch TV or use your computer) while eating. If it is time to eat, sit down at a table and enjoy your food. This will help you to know when you are full. It will also help you to be aware of what you are eating and how much you are eating. °What are tips for following this plan? °Reading food labels °· Check the calorie count compared to the serving size. The serving size may be smaller than what you are used to  eating. °· Check the source of the calories. Make sure the food you are eating is high in vitamins and protein and low in saturated and trans fats. °Shopping °· Read nutrition labels while you shop. This will help you make healthy decisions before you decide to purchase your food. °· Make a grocery list and stick to it. °Cooking °· Try to cook your favorite foods in a healthier way. For example, try baking instead of frying. °· Use low-fat dairy products. °Meal planning °· Use more fruits and vegetables. Half of your plate should be   fruits and vegetables. °· Include lean proteins like poultry and fish. °How do I count calories when eating out? °· Ask for smaller portion sizes. °· Consider sharing an entree and sides instead of getting your own entree. °· If you get your own entree, eat only half. Ask for a box at the beginning of your meal and put the rest of your entree in it so you are not tempted to eat it. °· If calories are listed on the menu, choose the lower calorie options. °· Choose dishes that include vegetables, fruits, whole grains, low-fat dairy products, and lean protein. °· Choose items that are boiled, broiled, grilled, or steamed. Stay away from items that are buttered, battered, fried, or served with cream sauce. Items labeled "crispy" are usually fried, unless stated otherwise. °· Choose water, low-fat milk, unsweetened iced tea, or other drinks without added sugar. If you want an alcoholic beverage, choose a lower calorie option such as a glass of wine or light beer. °· Ask for dressings, sauces, and syrups on the side. These are usually high in calories, so you should limit the amount you eat. °· If you want a salad, choose a garden salad and ask for grilled meats. Avoid extra toppings like bacon, cheese, or fried items. Ask for the dressing on the side, or ask for olive oil and vinegar or lemon to use as dressing. °· Estimate how many servings of a food you are given. For example, a serving of  cooked rice is ½ cup or about the size of half a baseball. Knowing serving sizes will help you be aware of how much food you are eating at restaurants. The list below tells you how big or small some common portion sizes are based on everyday objects: °? 1 oz--4 stacked dice. °? 3 oz--1 deck of cards. °? 1 tsp--1 die. °? 1 Tbsp--½ a ping-pong ball. °? 2 Tbsp--1 ping-pong ball. °? ½ cup--½ baseball. °? 1 cup--1 baseball. °Summary °· Calorie counting means keeping track of how many calories you eat and drink each day. If you eat fewer calories than your body needs, you should lose weight. °· A healthy amount of weight to lose per week is usually 1-2 lb (0.5-0.9 kg). This usually means reducing your daily calorie intake by 500-750 calories. °· The number of calories in a food can be found on a Nutrition Facts label. If a food does not have a Nutrition Facts label, try to look up the calories online or ask your dietitian for help. °· Use your calories on foods and drinks that will fill you up, and not on foods and drinks that will leave you hungry. °· Use smaller plates, glasses, and bowls to prevent overeating. °This information is not intended to replace advice given to you by your health care provider. Make sure you discuss any questions you have with your health care provider. °Document Released: 09/18/2005 Document Revised: 06/07/2018 Document Reviewed: 08/18/2016 °Elsevier Patient Education © 2020 [employer] ° °

## 2019-05-20 LAB — LIPID PANEL
Chol/HDL Ratio: 3.9 ratio (ref 0.0–4.4)
Cholesterol, Total: 167 mg/dL (ref 100–199)
HDL: 43 mg/dL (ref >39)
LDL Calculated: 99 mg/dL (ref 0–99)
Triglycerides: 125 mg/dL (ref 0–149)
VLDL Cholesterol Cal: 25 mg/dL (ref 5–40)

## 2019-05-20 LAB — BASIC METABOLIC PANEL
BUN/Creatinine Ratio: 28 — ABNORMAL HIGH (ref 9–23)
BUN: 26 mg/dL — ABNORMAL HIGH (ref 6–24)
CO2: 25 mmol/L (ref 20–29)
Calcium: 9.6 mg/dL (ref 8.7–10.2)
Chloride: 104 mmol/L (ref 96–106)
Creatinine, Ser: 0.92 mg/dL (ref 0.57–1.00)
GFR calc Af Amer: 87 mL/min/{1.73_m2} (ref >59)
GFR calc non Af Amer: 75 mL/min/{1.73_m2} (ref >59)
Glucose: 98 mg/dL (ref 65–99)
Potassium: 4.2 mmol/L (ref 3.5–5.2)
Sodium: 143 mmol/L (ref 134–144)

## 2019-05-20 LAB — MICROALBUMIN / CREATININE URINE RATIO
Creatinine, Urine: 128.2 mg/dL
Microalb/Creat Ratio: 389 mg/g creat — ABNORMAL HIGH (ref 0–29)
Microalbumin, Urine: 498.3 ug/mL

## 2019-05-21 ENCOUNTER — Telehealth: Payer: Self-pay

## 2019-05-21 NOTE — Telephone Encounter (Signed)
Patient was called and a voicemail was left informing patient to return phone call for lab results. 

## 2019-05-21 NOTE — Telephone Encounter (Signed)
-----   Message from Charlott Rakes, MD sent at 05/21/2019  1:17 PM EDT ----- Cholesterol is normal,microalbuminuria has improved as a result of improved Glycemic control

## 2019-05-22 NOTE — Telephone Encounter (Signed)
Pt called for lab results, pt states she cant keep stepping out of work if necessary a detailed VM can be left, she also states she gets out of work at 4:45..please follow up

## 2019-05-23 NOTE — Telephone Encounter (Signed)
Patient was called and a voicemail was left informing patient of lab results. 

## 2019-06-10 ENCOUNTER — Encounter: Payer: Self-pay | Admitting: Sports Medicine

## 2019-06-10 ENCOUNTER — Ambulatory Visit: Payer: BC Managed Care – PPO | Admitting: Sports Medicine

## 2019-06-10 ENCOUNTER — Other Ambulatory Visit: Payer: Self-pay

## 2019-06-10 VITALS — BP 146/95 | HR 105

## 2019-06-10 DIAGNOSIS — L600 Ingrowing nail: Secondary | ICD-10-CM

## 2019-06-10 DIAGNOSIS — E119 Type 2 diabetes mellitus without complications: Secondary | ICD-10-CM

## 2019-06-10 DIAGNOSIS — M2041 Other hammer toe(s) (acquired), right foot: Secondary | ICD-10-CM | POA: Diagnosis not present

## 2019-06-10 DIAGNOSIS — M2141 Flat foot [pes planus] (acquired), right foot: Secondary | ICD-10-CM

## 2019-06-10 DIAGNOSIS — M2042 Other hammer toe(s) (acquired), left foot: Secondary | ICD-10-CM

## 2019-06-10 DIAGNOSIS — M2142 Flat foot [pes planus] (acquired), left foot: Secondary | ICD-10-CM

## 2019-06-10 MED ORDER — NEOMYCIN-POLYMYXIN-HC 3.5-10000-1 OT SOLN: OTIC | 0 refills | Status: DC

## 2019-06-10 NOTE — Progress Notes (Signed)
Subjective: Sheila Bishop is a 48 y.o. female patient presents to office today complaining of a moderately painful incurvated, red, hot, swollen medial nail border of the 1st toe on the left foot. This has been present for years off and on. Patient has treated this by trimming, soaking, and OTC antifungal. Patient denies fever/chills/nausea/vomitting/any other related constitutional symptoms at this time.  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  Patient denies any other pedal complaints or symptoms at this time.  Review of Systems  All other systems reviewed and are negative.    Patient Active Problem List   Diagnosis Date Noted  . Obesity 08/20/2018  . Non-intractable vomiting with nausea 01/04/2017  . Acute non-recurrent frontal sinusitis 01/04/2017  . Irregular menses 02/10/2015  . Dyslipidemia associated with type 2 diabetes mellitus ([place]) 02/10/2015  . Diabetes type 2, uncontrolled ([place]) 11/26/2014  . Pain due to onychomycosis of toenail 11/26/2014  . Hypertension 11/01/2014    Current Outpatient Medications on File Prior to Visit  Medication Sig Dispense Refill  . albuterol (PROVENTIL HFA;VENTOLIN HFA) 108 (90 Base) MCG/ACT inhaler Inhale 2 puffs into the lungs every 6 (six) hours as needed for wheezing or shortness of breath. 1 Inhaler 0  . atorvastatin (LIPITOR) 40 MG tablet Take 1 tablet (40 mg total) by mouth daily.  30 tablet 6  . Blood Glucose Monitoring Suppl (CONTOUR NEXT MONITOR) w/Device KIT 1 each by Does not apply route 3 (three) times daily before meals. 1 kit 0  . cyclobenzaprine (FLEXERIL) 10 MG tablet Take 1 tablet (10 mg total) by mouth at bedtime. 30 tablet 1  . glipiZIDE (GLUCOTROL) 10 MG tablet Take 2 tablets (20 mg total) by mouth 2 (two) times daily before a meal. 120 tablet 6  . glucose blood (CONTOUR NEXT TEST) test strip 1 each by Other route 3 (three) times daily before meals. Use as instructed 100 each 12  . ibuprofen (ADVIL,MOTRIN) 800 MG tablet Take 1 tablet (800 mg total) by mouth 3 (three) times daily. 21 tablet 0  . Insulin Pen Needle (BD PEN NEEDLE NANO U/F) 32G X 4 MM MISC USE AS DIRECTED TO  INJECT  VICTOZA 100 each 4  . liraglutide (VICTOZA) 18 MG/3ML SOPN Inject 0.3 mLs (1.8 mg total) into the skin daily with breakfast. 3 pen 6  . lisinopril (ZESTRIL) 40 MG tablet Take 1 tablet (40 mg total) by mouth daily. 30 tablet 6  . loperamide (IMODIUM A-D) 2 MG capsule 2 mg Q 6 hrs PRN 12 capsule 0  . Microlet Lancets MISC Use to check blood sugar up to 3 times daily. 100 each 11   No current facility-administered medications on file prior to visit.     No Known Allergies  Objective:  There were no vitals filed for this visit.  General: Well developed, nourished, in no acute distress, alert and  oriented x3   Dermatology: Skin is warm, dry and supple bilateral. Left hallux nail appears to be  severely incurvated with hyperkeratosis formation at the distal aspects of  the medial nail border. (+) Erythema. (+) Edema. (-) serosanguous  drainage present. The remaining nails appear unremarkable at this time. There are no open sores, lesions or other signs of infection present.  Minimal callus noted to left foot.  Vascular: Dorsalis Pedis artery and Posterior Tibial artery pedal pulses are 2/4 bilateral with immedate capillary fill time. Pedal hair growth present. No lower extremity  edema.   Neruologic: Grossly intact via light touch bilateral.  Musculoskeletal: Tenderness to palpation of the left hallux medial nail fold.  Pes planus foot type with mild hammertoe deformities currently asymptomatic.  Muscular strength within normal limits in all groups bilateral.   Assesement and Plan: Problem List Items Addressed This Visit    None    Visit Diagnoses    Ingrown nail of great toe of left foot    -  Primary   Encounter for comprehensive diabetic foot examination, type 2 diabetes mellitus ([place])       Pes planus of both feet       Hammertoe, bilateral       Diabetes mellitus without complication ([place])          -Discussed treatment alternatives and plan of care; Explained permanent/temporary nail avulsion and post procedure course to patient. Patient elects for PNA left hallux medial margin. - After a verbal and written consent, injected 3 ml of a 50:50 mixture of 2% plain  lidocaine and 0.5% plain marcaine in a normal hallux block fashion. Next, a  betadine prep was performed. Anesthesia was tested and found to be appropriate.  The offending left hallux medial nail border was then incised from the hyponychium to the epinychium. The offending nail border was removed and cleared from the field. The area was curretted for any remaining nail or spicules. Phenol application performed and the area was then flushed with alcohol and dressed with antibiotic cream and a dry sterile dressing. -Patient was instructed to leave the dressing intact for today and begin soaking  in a weak solution of betadine or Epsom salt and water tomorrow. Patient was instructed to  soak for 15-20 minutes each day and apply neosporin/corticosporin and a gauze or bandaid dressing each day. -Patient was instructed to monitor the toe for signs of infection and return to office if toe becomes red, hot or swollen. -Advised ice, elevation, and tylenol or motrin if needed for pain.  -Patient to return to  office for diabetic shoe measurements with [facility] office to call patient to schedule this appointment otherwise patient to return in 2 weeks for nail check as scheduled. -Work excuse given for today's visit.  Landis Martins, DPM

## 2019-06-10 NOTE — Patient Instructions (Signed)

## 2019-06-11 MED FILL — MICROLET LANCETS MISC: 30 days supply | Qty: 100 | Fill #0

## 2019-06-24 ENCOUNTER — Other Ambulatory Visit (HOSPITAL_COMMUNITY)
Admission: RE | Admit: 2019-06-24 | Discharge: 2019-06-24 | Disposition: A | Discharge status: Home / Self Care | Payer: BC Managed Care – PPO | Source: Ambulatory Visit | Attending: Family Medicine | Admitting: Family Medicine

## 2019-06-24 ENCOUNTER — Ambulatory Visit (HOSPITAL_BASED_OUTPATIENT_CLINIC_OR_DEPARTMENT_OTHER): Payer: BC Managed Care – PPO | Admitting: Family Medicine

## 2019-06-24 ENCOUNTER — Encounter: Payer: Self-pay | Admitting: Family Medicine

## 2019-06-24 ENCOUNTER — Other Ambulatory Visit: Payer: Self-pay

## 2019-06-24 VITALS — BP 148/84 | HR 91 | Temp 98.2°F | Ht 66.0 in | Wt 233.0 lb

## 2019-06-24 DIAGNOSIS — Z23 Encounter for immunization: Secondary | ICD-10-CM

## 2019-06-24 DIAGNOSIS — Z124 Encounter for screening for malignant neoplasm of cervix: Secondary | ICD-10-CM | POA: Insufficient documentation

## 2019-06-24 DIAGNOSIS — Z1239 Encounter for other screening for malignant neoplasm of breast: Secondary | ICD-10-CM

## 2019-06-24 DIAGNOSIS — R0981 Nasal congestion: Secondary | ICD-10-CM | POA: Diagnosis not present

## 2019-06-24 NOTE — Patient Instructions (Signed)

## 2019-06-24 NOTE — Progress Notes (Signed)
Subjective:  Patient ID: Sheila Bishop, female    DOB: 1971/08/08  Age: 48 y.o. MRN: [id number]  CC: Gynecologic Exam and Headache   HPI Sheila Bishop  is a 48 year old female with history of type 2 diabetes mellitus (A1c 7.6), hypertension, hyperlipidemia who presents today for a PAP smear. Last PAP smear was in 01/2018: normal cytology, HPV +ve, neg for high risk HPV Last mammogram was in 03/2018: negative for malignancy  She endorses frontal sinus pressure and headache. No fever, myalgia, cough, dyspnea. Sore throat.  Past Medical History:  Diagnosis Date  . Breast abscess Nov 2012   left  . Depression 2007  . Diabetes mellitus 2009  . Headache(784.0)   . Hypertension 2009  . Obesity   . Wears glasses     Past Surgical History:  Procedure Laterality Date  . BREAST BIOPSY  08/07/2011   Procedure: BREAST BIOPSY;  Surgeon: Joyice Faster. Cornett, MD;  Location: [facility];  Service: General;  Laterality: Left;  BIOPSY BREAST LEFT SIDE  . CESAREAN SECTION  2002    Family History  Problem Relation Age of Onset  . Hyperlipidemia Mother   . Hypertension Mother   . Heart disease Mother     No Known Allergies  Outpatient Medications Prior to Visit  Medication Sig Dispense Refill  . albuterol (PROVENTIL HFA;VENTOLIN HFA) 108 (90 Base) MCG/ACT inhaler Inhale 2 puffs into the lungs every 6 (six) hours as needed for wheezing or shortness of breath. 1 Inhaler 0  . atorvastatin (LIPITOR) 40 MG tablet Take 1 tablet (40 mg total) by mouth daily. 30 tablet 6  . Blood Glucose Monitoring Suppl (CONTOUR NEXT MONITOR) w/Device KIT 1 each by Does not apply route 3 (three) times daily before meals. 1 kit 0  . cyclobenzaprine (FLEXERIL) 10 MG tablet Take 1 tablet (10 mg total) by mouth at bedtime. 30 tablet 1  . glipiZIDE (GLUCOTROL) 10 MG tablet Take 2 tablets (20 mg total) by mouth 2 (two) times daily before a meal. 120 tablet 6  . glucose blood (CONTOUR NEXT TEST) test strip 1 each by  Other route 3 (three) times daily before meals. Use as instructed 100 each 12  . Insulin Pen Needle (BD PEN NEEDLE NANO U/F) 32G X 4 MM MISC USE AS DIRECTED TO  INJECT  VICTOZA 100 each 4  . liraglutide (VICTOZA) 18 MG/3ML SOPN Inject 0.3 mLs (1.8 mg total) into the skin daily with breakfast. 3 pen 6  . lisinopril (ZESTRIL) 40 MG tablet Take 1 tablet (40 mg total) by mouth daily. 30 tablet 6  . loperamide (IMODIUM A-D) 2 MG capsule 2 mg Q 6 hrs PRN 12 capsule 0  . Microlet Lancets MISC Use to check blood sugar up to 3 times daily. 100 each 11  . neomycin-polymyxin-hydrocortisone (CORTISPORIN) OTIC solution Apply 2-3 drops to the ingrown toenail site twice daily. Cover with band-aid. 10 mL 0  . ibuprofen (ADVIL,MOTRIN) 800 MG tablet Take 1 tablet (800 mg total) by mouth 3 (three) times daily. (Patient not taking: Reported on 06/24/2019) 21 tablet 0   No facility-administered medications prior to visit.      ROS Review of Systems  Constitutional: Negative for activity change, appetite change and fatigue.  HENT: Positive for congestion. Negative for sinus pressure and sore throat.   Eyes: Negative for visual disturbance.  Respiratory: Negative for cough, chest tightness, shortness of breath and wheezing.   Cardiovascular: Negative for chest pain and palpitations.  Gastrointestinal: Negative for  abdominal distention, abdominal pain and constipation.  Endocrine: Negative for polydipsia.  Genitourinary: Negative for dysuria and frequency.  Musculoskeletal: Negative for arthralgias and back pain.  Skin: Negative for rash.  Neurological: Positive for headaches. Negative for tremors, light-headedness and numbness.  Hematological: Does not bruise/bleed easily.  Psychiatric/Behavioral: Negative for agitation and behavioral problems.    Objective:  BP (!) 148/84   Pulse 91   Temp 98.2 F (36.8 C) (Oral)   Ht _0  (1.676 m)   Wt 233 lb (105.7 kg)   SpO2 98%   BMI 37.61 kg/m   BP/Weight  06/24/2019 06/10/2019 9/47/0962  Systolic BP 836 629 476  Diastolic BP 84 95 85  Wt. (Lbs) 233 - 231.8  BMI 37.61 - 37.41      Physical Exam Constitutional:      Appearance: She is well-developed.  Neck:     Vascular: No JVD.  Cardiovascular:     Rate and Rhythm: Normal rate.     Heart sounds: Normal heart sounds. No murmur.  Pulmonary:     Effort: Pulmonary effort is normal.     Breath sounds: Normal breath sounds. No wheezing or rales.  Chest:     Chest wall: No tenderness.  Abdominal:     General: Bowel sounds are normal. There is no distension.     Palpations: Abdomen is soft. There is no mass.     Tenderness: There is no abdominal tenderness.  Musculoskeletal: Normal range of motion.     Right lower leg: No edema.     Left lower leg: No edema.  Neurological:     Mental Status: She is alert and oriented to person, place, and time.  Psychiatric:        Mood and Affect: Mood normal.     CMP Latest Ref Rng & Units 05/19/2019 02/12/2019 08/20/2018  Glucose 65 - 99 mg/dL 98 73 131(H)  BUN 6 - 24 mg/dL 26(H) 22 25(H)  Creatinine 0.57 - 1.00 mg/dL 0.92 0.73 0.77  Sodium 134 - 144 mmol/L 143 139 141  Potassium 3.5 - 5.2 mmol/L 4.2 3.9 4.0  Chloride 96 - 106 mmol/L 104 101 104  CO2 20 - 29 mmol/L _1 Calcium 8.7 - 10.2 mg/dL 9.6 9.2 9.1  Total Protein 6.0 - 8.5 g/dL - 6.7 -  Total Bilirubin 0.0 - 1.2 mg/dL - 0.3 -  Alkaline Phos 39 - 117 IU/L - 105 -  AST 0 - 40 IU/L - 12 -  ALT 0 - 32 IU/L - 13 -    Lipid Panel     Component Value Date/Time   CHOL 167 05/19/2019 1417   TRIG 125 05/19/2019 1417   HDL 43 05/19/2019 1417   CHOLHDL 3.9 05/19/2019 1417   CHOLHDL 3.9 11/01/2016 1608   VLDL 18 06/16/2010 2054   LDLCALC 99 05/19/2019 1417   LDLDIRECT 131 (H) 04/21/2010 2016    CBC    Component Value Date/Time   WBC 14.6 (H) 12/08/2017 2135   RBC 3.91 12/08/2017 2135   HGB 10.2 (L) 12/08/2017 2135   HGB 10.5 (L) 01/04/2017 1131   HCT 30.8 (L) 12/08/2017  2135   HCT 31.6 (L) 01/04/2017 1131   PLT 330 12/08/2017 2135   PLT 326 01/04/2017 1131   MCV 78.8 12/08/2017 2135   MCV 80 01/04/2017 1131   MCH 26.1 12/08/2017 2135   MCHC 33.1 12/08/2017 2135   RDW 13.9 12/08/2017 2135   RDW 13.8 01/04/2017 1131  LYMPHSABS 2,684 02/23/2016 1052   MONOABS 610 02/23/2016 1052   EOSABS 122 02/23/2016 1052   BASOSABS 0 02/23/2016 1052    Lab Results  Component Value Date   HGBA1C 7.6 (A) 05/19/2019    Assessment & Plan:   1. Screening for breast cancer - MM Digital Screening; Future  2. Screening for cervical cancer - Cytology - PAP([place])  3. Sinus congestion Could explain headache Advised to use OTC Zyrtec along with Tylenol    No orders of the defined types were placed in this encounter.   Follow-up: Return in about 6 months (around 12/22/2019) for Diabetes.       Charlott Rakes, MD, FAAFP. [facility] and [place], [place]   06/24/2019, 9:50 AM

## 2019-06-25 LAB — CYTOLOGY - PAP
Diagnosis: NEGATIVE
High risk HPV: NEGATIVE
Molecular Disclaimer: 56
Molecular Disclaimer: DETECTED
Molecular Disclaimer: NORMAL

## 2019-06-27 ENCOUNTER — Telehealth: Payer: Self-pay

## 2019-06-27 NOTE — Telephone Encounter (Signed)
-----   Message from Charlott Rakes, MD sent at 06/26/2019 10:48 AM EDT ----- PAP smear is negative for malignancy

## 2019-06-27 NOTE — Telephone Encounter (Signed)
Patient name and DOB has been verified Patient was informed of lab results. Patient had no questions.  

## 2019-07-01 ENCOUNTER — Ambulatory Visit: Payer: BC Managed Care – PPO | Admitting: Sports Medicine

## 2019-07-18 DIAGNOSIS — M25561 Pain in right knee: Secondary | ICD-10-CM | POA: Diagnosis not present

## 2019-09-08 MED FILL — CONTOUR NEXT STRIPS: 33 days supply | Qty: 100 | Fill #1

## 2019-09-08 MED FILL — MICROLET LANCETS MISC: 30 days supply | Qty: 100 | Fill #1

## 2019-10-10 DIAGNOSIS — M25561 Pain in right knee: Secondary | ICD-10-CM | POA: Diagnosis not present

## 2019-10-16 ENCOUNTER — Other Ambulatory Visit: Payer: Self-pay

## 2019-10-16 ENCOUNTER — Ambulatory Visit: Payer: BC Managed Care – PPO | Attending: Internal Medicine | Admitting: Internal Medicine

## 2019-11-04 MED FILL — CONTOUR NEXT STRIPS: 33 days supply | Qty: 100 | Fill #2

## 2019-11-04 MED FILL — MICROLET LANCETS MISC: 30 days supply | Qty: 100 | Fill #2

## 2019-11-27 ENCOUNTER — Other Ambulatory Visit: Payer: Self-pay

## 2019-11-27 ENCOUNTER — Ambulatory Visit: Payer: BC Managed Care – PPO | Attending: Family Medicine | Admitting: Physician Assistant

## 2019-11-27 DIAGNOSIS — E1169 Type 2 diabetes mellitus with other specified complication: Secondary | ICD-10-CM

## 2019-11-27 DIAGNOSIS — R062 Wheezing: Secondary | ICD-10-CM

## 2019-11-27 DIAGNOSIS — I10 Essential (primary) hypertension: Secondary | ICD-10-CM

## 2019-11-27 DIAGNOSIS — R112 Nausea with vomiting, unspecified: Secondary | ICD-10-CM | POA: Diagnosis not present

## 2019-11-27 DIAGNOSIS — E785 Hyperlipidemia, unspecified: Secondary | ICD-10-CM

## 2019-11-27 MED ORDER — GLIPIZIDE 10 MG PO TABS: 20.0000 mg | ORAL_TABLET | Freq: Two times a day (BID) | ORAL | 6 refills | Status: DC

## 2019-11-27 MED ORDER — ONDANSETRON HCL 8 MG PO TABS: 8.0000 mg | ORAL_TABLET | Freq: Three times a day (TID) | ORAL | 0 refills | Status: DC | PRN

## 2019-11-27 MED ORDER — BD PEN NEEDLE NANO U/F 32G X 4 MM MISC: 4 refills | Status: DC

## 2019-11-27 MED ORDER — LISINOPRIL 40 MG PO TABS: 40.0000 mg | ORAL_TABLET | Freq: Every day | ORAL | 6 refills | Status: DC

## 2019-11-27 MED ORDER — ATORVASTATIN CALCIUM 40 MG PO TABS: 40.0000 mg | ORAL_TABLET | Freq: Every day | ORAL | 6 refills | Status: DC

## 2019-11-27 MED ORDER — VICTOZA 18 MG/3ML ~~LOC~~ SOPN: 1.8000 mg | PEN_INJECTOR | Freq: Every day | SUBCUTANEOUS | 6 refills | Status: DC

## 2019-11-27 MED ORDER — ALBUTEROL SULFATE HFA 108 (90 BASE) MCG/ACT IN AERS: 2.0000 | INHALATION_SPRAY | Freq: Four times a day (QID) | RESPIRATORY_TRACT | 1 refills | Status: DC | PRN

## 2019-11-27 NOTE — Progress Notes (Signed)
Virtual Visit via Telephone Note  I connected with Sheila Bishop on 11/27/19 at  2:50 PM EST by telephone and verified that I am speaking with the correct person using two identifiers.   I discussed the limitations, risks, security and privacy concerns of performing an evaluation and management service by telephone and the availability of in person appointments. I also discussed with the patient that there may be a patient responsible charge related to this service. The patient expressed understanding and agreed to proceed.  PATIENT visit by telephone virtually in the context of Covid-19 pandemic. Patient location:  home My Location:  [place] office Persons on the call:  Me and the patient   History of Present Illness:  Yesterday had sharp pains shooting through her head and felt like her heart was racing. That has resolved.  No vision changes. About 4:30 this morning vomited several times.  Blood sugars running ~130.  HA is better but still there.  Ate some boiled eggs this morning and has not thrown up again.  She is drinking a lot of water.  She works in a [employer].  No abdominal pain or diarrhea.  No respiratory s/sx.  No known exposure to Covid.  No fever.  Needs RF on meds    Observations/Objective:  NAD.  A&Ox3-says feeling some better than she has in past 2 days now   Assessment and Plan: 1. Essential hypertension - lisinopril (ZESTRIL) 40 MG tablet; Take 1 tablet (40 mg total) by mouth daily.  Dispense: 30 tablet; Refill: 6  2. Type 2 diabetes mellitus with other specified complication, without long-term current use of insulin (HCC) - liraglutide (VICTOZA) 18 MG/3ML SOPN; Inject 0.3 mLs (1.8 mg total) into the skin daily with breakfast.  Dispense: 3 pen; Refill: 6 - glipiZIDE (GLUCOTROL) 10 MG tablet; Take 2 tablets (20 mg total) by mouth 2 (two) times daily before a meal.  Dispense: 120 tablet; Refill: 6 - Insulin Pen Needle (BD PEN NEEDLE NANO U/F) 32G X 4 MM MISC; USE AS  DIRECTED TO  INJECT  VICTOZA  Dispense: 100 each; Refill: 4  3. Wheezing - albuterol (VENTOLIN HFA) 108 (90 Base) MCG/ACT inhaler; Inhale 2 puffs into the lungs every 6 (six) hours as needed for wheezing or shortness of breath.  Dispense: 18 g; Refill: 1  4. Dyslipidemia associated with type 2 diabetes mellitus (HCC) - atorvastatin (LIPITOR) 40 MG tablet; Take 1 tablet (40 mg total) by mouth daily.  Dispense: 30 tablet; Refill: 6  5. Nausea and vomiting, intractability of vomiting not specified, unspecified vomiting type Gave information and should go get covid testing.  OOW the rest of this week - ondansetron (ZOFRAN) 8 MG tablet; Take 1 tablet (8 mg total) by mouth every 8 (eight) hours as needed for nausea or vomiting.  Dispense: 30 tablet; Refill: 0    Follow Up Instructions: appt with PCP on 12/24/2019   I discussed the assessment and treatment plan with the patient. The patient was provided an opportunity to ask questions and all were answered. The patient agreed with the plan and demonstrated an understanding of the instructions.   The patient was advised to call back or seek an in-person evaluation if the symptoms worsen or if the condition fails to improve as anticipated.  I provided 14 minutes of non-face-to-face time during this encounter.   Freeman Caldron, PA-C  Patient ID: Sheila Bishop, female   DOB: November 19, 1970, 49 y.o.   MRN: [id number]

## 2019-11-27 NOTE — Progress Notes (Signed)
Patient has been called and DOB has been verified. Patient has been screened and transferred to PCP to start phone visit.    Headaches started yesterday vomiting started today and headaches has continued her blood sugars has not been elevated.  Patient is needing refills on medications

## 2019-11-28 ENCOUNTER — Ambulatory Visit (HOSPITAL_COMMUNITY)
Admission: EM | Admit: 2019-11-28 | Discharge: 2019-11-28 | Disposition: A | Discharge status: Home / Self Care | Payer: BC Managed Care – PPO | Attending: Family Medicine | Admitting: Family Medicine

## 2019-11-28 ENCOUNTER — Other Ambulatory Visit: Payer: Self-pay

## 2019-11-28 ENCOUNTER — Encounter (HOSPITAL_COMMUNITY): Payer: Self-pay

## 2019-11-28 DIAGNOSIS — E785 Hyperlipidemia, unspecified: Secondary | ICD-10-CM | POA: Insufficient documentation

## 2019-11-28 DIAGNOSIS — Z791 Long term (current) use of non-steroidal anti-inflammatories (NSAID): Secondary | ICD-10-CM | POA: Diagnosis not present

## 2019-11-28 DIAGNOSIS — E669 Obesity, unspecified: Secondary | ICD-10-CM | POA: Diagnosis not present

## 2019-11-28 DIAGNOSIS — Z87891 Personal history of nicotine dependence: Secondary | ICD-10-CM | POA: Diagnosis not present

## 2019-11-28 DIAGNOSIS — R112 Nausea with vomiting, unspecified: Secondary | ICD-10-CM | POA: Diagnosis not present

## 2019-11-28 DIAGNOSIS — Z6837 Body mass index (BMI) 37.0-37.9, adult: Secondary | ICD-10-CM | POA: Insufficient documentation

## 2019-11-28 DIAGNOSIS — E119 Type 2 diabetes mellitus without complications: Secondary | ICD-10-CM | POA: Insufficient documentation

## 2019-11-28 DIAGNOSIS — Z79899 Other long term (current) drug therapy: Secondary | ICD-10-CM | POA: Diagnosis not present

## 2019-11-28 DIAGNOSIS — R519 Headache, unspecified: Secondary | ICD-10-CM | POA: Diagnosis not present

## 2019-11-28 DIAGNOSIS — Z20822 Contact with and (suspected) exposure to covid-19: Secondary | ICD-10-CM | POA: Insufficient documentation

## 2019-11-28 DIAGNOSIS — I10 Essential (primary) hypertension: Secondary | ICD-10-CM | POA: Insufficient documentation

## 2019-11-28 DIAGNOSIS — Z794 Long term (current) use of insulin: Secondary | ICD-10-CM | POA: Insufficient documentation

## 2019-11-28 MED ORDER — KETOROLAC TROMETHAMINE 60 MG/2ML IM SOLN
INTRAMUSCULAR | Status: AC
  Filled 2019-11-28: qty 2

## 2019-11-28 MED ORDER — KETOROLAC TROMETHAMINE 60 MG/2ML IM SOLN
60.0000 mg | Freq: Once | INTRAMUSCULAR | Status: AC
  Administered 2019-11-28: 60 mg via INTRAMUSCULAR

## 2019-11-28 NOTE — Discharge Instructions (Addendum)
Your COVID test is pending.  You should self quarantine until your test result is back and is negative.    Take Tylenol as needed for fever or discomfort.  Rest and keep yourself hydrated.    Go to the emergency department if you develop high fever, shortness of breath, severe diarrhea, or other concerning symptoms.    

## 2019-11-28 NOTE — ED Triage Notes (Signed)
Pt is here with a headache and vomiting since yesterday. Pt has taken Advil to relieve discomfort.

## 2019-11-28 NOTE — ED Provider Notes (Signed)
[place]    CSN: [id number] Arrival date & time: 11/28/19  6834      History   Chief Complaint Chief Complaint  Patient presents with  . Vomiting  . Headache    HPI Sheila Bishop is a 49 y.o. female.   Patient reports headache and vomiting since yesterday.  Reports that she is taking Advil without relief of her headache.  Reports that she had a video visit, and that she has Zofran at home.  Denies use today. Denies nausea at this time.  Denies abdominal pain, fever, shortness of breath, sore throat, rash or other symptoms.  Denies sick contacts.  ROS per HPI  The history is provided by the patient.  Headache   Past Medical History:  Diagnosis Date  . Breast abscess Nov 2012   left  . Depression 2007  . Diabetes mellitus 2009  . Headache(784.0)   . Hypertension 2009  . Obesity   . Wears glasses     Patient Active Problem List   Diagnosis Date Noted  . Obesity 08/20/2018  . Non-intractable vomiting with nausea 01/04/2017  . Acute non-recurrent frontal sinusitis 01/04/2017  . Irregular menses 02/10/2015  . Dyslipidemia associated with type 2 diabetes mellitus ([place]) 02/10/2015  . Diabetes type 2, uncontrolled ([place]) 11/26/2014  . Pain due to onychomycosis of toenail 11/26/2014  . Hypertension 11/01/2014    Past Surgical History:  Procedure Laterality Date  . BREAST BIOPSY  08/07/2011   Procedure: BREAST BIOPSY;  Surgeon: Joyice Faster. Cornett, MD;  Location: [facility];  Service: General;  Laterality: Left;  BIOPSY BREAST LEFT SIDE  . CESAREAN SECTION  2002    OB History    Gravida  4   Para  3   Term  3   Preterm  0   AB  1   Living  4     SAB  1   TAB      Ectopic      Multiple  1   Live Births               Home Medications    Prior to Admission medications   Medication Sig Start Date End Date Taking? Authorizing Provider  albuterol (VENTOLIN HFA) 108 (90 Base) MCG/ACT inhaler Inhale 2 puffs into the lungs every 6  (six) hours as needed for wheezing or shortness of breath. 11/27/19   Argentina Donovan, PA-C  atorvastatin (LIPITOR) 40 MG tablet Take 1 tablet (40 mg total) by mouth daily. 11/27/19   Argentina Donovan, PA-C  Blood Glucose Monitoring Suppl (CONTOUR NEXT MONITOR) w/Device KIT 1 each by Does not apply route 3 (three) times daily before meals. 06/10/18   Charlott Rakes, MD  cyclobenzaprine (FLEXERIL) 10 MG tablet Take 1 tablet (10 mg total) by mouth at bedtime. 02/07/18   Charlott Rakes, MD  glipiZIDE (GLUCOTROL) 10 MG tablet Take 2 tablets (20 mg total) by mouth 2 (two) times daily before a meal. 11/27/19   McClung, Dionne Bucy, PA-C  glucose blood (CONTOUR NEXT TEST) test strip 1 each by Other route 3 (three) times daily before meals. Use as instructed 05/19/19   Charlott Rakes, MD  ibuprofen (ADVIL,MOTRIN) 800 MG tablet Take 1 tablet (800 mg total) by mouth 3 (three) times daily. Patient not taking: Reported on 06/24/2019 12/08/17   Larene Pickett, PA-C  Insulin Pen Needle (BD PEN NEEDLE NANO U/F) 32G X 4 MM MISC USE AS DIRECTED TO  INJECT  VICTOZA 11/27/19  Freeman Caldron M, PA-C  liraglutide (VICTOZA) 18 MG/3ML SOPN Inject 0.3 mLs (1.8 mg total) into the skin daily with breakfast. 11/27/19   Argentina Donovan, PA-C  lisinopril (ZESTRIL) 40 MG tablet Take 1 tablet (40 mg total) by mouth daily. 11/27/19   Argentina Donovan, PA-C  loperamide (IMODIUM A-D) 2 MG capsule 2 mg Q 6 hrs PRN 10/16/17   Ladell Pier, MD  meloxicam (MOBIC) 15 MG tablet Take 15 mg by mouth daily. 09/19/19   [provider]  Microlet Lancets MISC Use to check blood sugar up to 3 times daily. 05/19/19   Charlott Rakes, MD  neomycin-polymyxin-hydrocortisone (CORTISPORIN) OTIC solution Apply 2-3 drops to the ingrown toenail site twice daily. Cover with band-aid. 06/10/19   Landis Martins, DPM  ondansetron (ZOFRAN) 8 MG tablet Take 1 tablet (8 mg total) by mouth every 8 (eight) hours as needed for nausea or vomiting. 11/27/19    Argentina Donovan, PA-C    Family History Family History  Problem Relation Age of Onset  . Hyperlipidemia Mother   . Hypertension Mother   . Heart disease Mother     Social History Social History   Tobacco Use  . Smoking status: Former Smoker    Quit date: 12/31/2007    Years since quitting: 11.9  . Smokeless tobacco: Never Used  Substance Use Topics  . Alcohol use: No  . Drug use: No     Allergies   Patient has no known allergies.   Review of Systems Review of Systems  Neurological: Positive for headaches.     Physical Exam Triage Vital Signs ED Triage Vitals  Enc Vitals Group     BP 11/28/19 0839 (!) 167/97     Pulse Rate 11/28/19 0839 96     Resp 11/28/19 0839 19     Temp 11/28/19 0839 98.4 F (36.9 C)     Temp Source 11/28/19 0839 Oral     SpO2 11/28/19 0839 100 %     Weight 11/28/19 0837 231 lb 3.2 oz (104.9 kg)     Height --      Head Circumference --      Peak Flow --      Pain Score 11/28/19 0836 10     Pain Loc --      Pain Edu? --      Excl. in [place]? --    No data found.  Updated Vital Signs BP (!) 167/97 (BP Location: Left Arm)   Pulse 96   Temp 98.4 F (36.9 C) (Oral)   Resp 19   Wt 231 lb 3.2 oz (104.9 kg)   LMP 11/09/2019   SpO2 100%   BMI 37.32 kg/m   Visual Acuity Right Eye Distance:   Left Eye Distance:   Bilateral Distance:    Right Eye Near:   Left Eye Near:    Bilateral Near:     Physical Exam Vitals and nursing note reviewed.  Constitutional:      General: She is not in acute distress.    Appearance: She is well-developed. She is ill-appearing.  HENT:     Head: Normocephalic and atraumatic.     Mouth/Throat:     Mouth: Mucous membranes are moist.  Eyes:     Conjunctiva/sclera: Conjunctivae normal.  Cardiovascular:     Rate and Rhythm: Normal rate and regular rhythm.     Heart sounds: Normal heart sounds. No murmur.  Pulmonary:     Effort: Pulmonary effort is normal.  No respiratory distress.     Breath  sounds: Normal breath sounds.  Abdominal:     General: Bowel sounds are normal. There is no distension.     Palpations: Abdomen is soft. There is no mass.     Tenderness: There is no abdominal tenderness. There is no guarding or rebound.  Musculoskeletal:        General: Normal range of motion.     Cervical back: Neck supple.  Skin:    General: Skin is warm and dry.     Capillary Refill: Capillary refill takes less than 2 seconds.  Neurological:     General: No focal deficit present.     Mental Status: She is alert and oriented to person, place, and time.  Psychiatric:        Mood and Affect: Mood normal.        Behavior: Behavior normal.      UC Treatments / Results  Labs (all labs ordered are listed, but only abnormal results are displayed) Labs Reviewed  NOVEL CORONAVIRUS, NAA (HOSP ORDER, SEND-OUT TO REF LAB; TAT 18-24 HRS)    EKG   Radiology No results found.  Procedures Procedures (including critical care time)  Medications Ordered in UC Medications  ketorolac (TORADOL) injection 60 mg (60 mg Intramuscular Given 11/28/19 0902)    Initial Impression / Assessment and Plan / UC Course  I have reviewed the triage vital signs and the nursing notes.  Pertinent labs & imaging results that were available during my care of the patient were reviewed by me and considered in my medical decision making (see chart for details).    Send out Covid test pending, will inform patient of results.  Instructed to quarantine until results are back and negative.  Toradol in office for headache 60 mg IM.  Instructed to follow-up with primary care as needed.  Instructed to go to the ER for severe nausea, diarrhea, fever, or other concerning symptoms. Final Clinical Impressions(s) / UC Diagnoses   Final diagnoses:  Intractable headache, unspecified chronicity pattern, unspecified headache type  Nausea and vomiting, intractability of vomiting not specified, unspecified vomiting type      Discharge Instructions     Your COVID test is pending.  You should self quarantine until your test result is back and is negative.    Take Tylenol as needed for fever or discomfort.  Rest and keep yourself hydrated.    Go to the emergency department if you develop high fever, shortness of breath, severe diarrhea, or other concerning symptoms.       ED Prescriptions    None     I have reviewed the PDMP during this encounter.   Faustino Congress, NP 11/28/19 1104

## 2019-11-29 LAB — NOVEL CORONAVIRUS, NAA (HOSP ORDER, SEND-OUT TO REF LAB; TAT 18-24 HRS): SARS-CoV-2, NAA: NOT DETECTED

## 2019-12-24 ENCOUNTER — Ambulatory Visit: Payer: BC Managed Care – PPO | Attending: Family Medicine | Admitting: Physician Assistant

## 2019-12-24 ENCOUNTER — Other Ambulatory Visit: Payer: Self-pay

## 2019-12-24 VITALS — BP 142/88 | HR 93 | Temp 97.7°F | Resp 16 | Wt 235.2 lb

## 2019-12-24 DIAGNOSIS — I10 Essential (primary) hypertension: Secondary | ICD-10-CM | POA: Diagnosis not present

## 2019-12-24 DIAGNOSIS — E785 Hyperlipidemia, unspecified: Secondary | ICD-10-CM

## 2019-12-24 DIAGNOSIS — R062 Wheezing: Secondary | ICD-10-CM | POA: Diagnosis not present

## 2019-12-24 DIAGNOSIS — E1169 Type 2 diabetes mellitus with other specified complication: Secondary | ICD-10-CM

## 2019-12-24 LAB — GLUCOSE, POCT (MANUAL RESULT ENTRY): POC Glucose: 113 mg/dl — AB (ref 70–99)

## 2019-12-24 LAB — POCT GLYCOSYLATED HEMOGLOBIN (HGB A1C): HbA1c, POC (controlled diabetic range): 7.7 % — AB (ref 0.0–7.0)

## 2019-12-24 MED ORDER — ATORVASTATIN CALCIUM 40 MG PO TABS: 40.0000 mg | ORAL_TABLET | Freq: Every day | ORAL | 6 refills | Status: DC

## 2019-12-24 MED ORDER — GLIPIZIDE 10 MG PO TABS: 20.0000 mg | ORAL_TABLET | Freq: Two times a day (BID) | ORAL | 6 refills | Status: DC

## 2019-12-24 MED ORDER — BD PEN NEEDLE NANO U/F 32G X 4 MM MISC: 4 refills | Status: DC

## 2019-12-24 MED ORDER — LISINOPRIL 40 MG PO TABS: 40.0000 mg | ORAL_TABLET | Freq: Every day | ORAL | 6 refills | Status: DC

## 2019-12-24 MED ORDER — VICTOZA 18 MG/3ML ~~LOC~~ SOPN: 1.8000 mg | PEN_INJECTOR | Freq: Every day | SUBCUTANEOUS | 6 refills | Status: DC

## 2019-12-24 MED ORDER — ALBUTEROL SULFATE HFA 108 (90 BASE) MCG/ACT IN AERS: 2.0000 | INHALATION_SPRAY | Freq: Four times a day (QID) | RESPIRATORY_TRACT | 1 refills | Status: AC | PRN

## 2019-12-24 NOTE — Progress Notes (Signed)
Sheila Bishop, is a 49 y.o. female  GHW:[id number]  VEL:[id number]  DOB - Dec 25, 1970  Subjective:  Chief Complaint and HPI: Sheila Bishop is a 49 y.o. female here today for diabetes and BP  Check.  Has not taken Lisinopril.  BP usu good OOO.  She says she has been doing pretty well with her diet and eating less carbs.  Today she has had some cucumbers and a cup of chicken salad.  She has been unable to exercise due to a R knee injury she had to get cortisone injections for.  She is compliant with medications.  No new concerns or complaints today.  Checks sugars occasionally and usu gets around 150.  Sometimes 100, up to 200.    ROS:   Constitutional:  No f/c, No night sweats, No unexplained weight loss. EENT:  No vision changes, No blurry vision, No hearing changes. No mouth, throat, or ear problems.  Respiratory: No cough, No SOB Cardiac: No CP, no palpitations GI:  No abd pain, No N/V/D. GU: No Urinary s/sx Musculoskeletal: R knee pain Neuro: No headache, no dizziness, no motor weakness.  Skin: No rash Endocrine:  No polydipsia. No polyuria.  Psych: Denies SI/HI  No problems updated.  ALLERGIES: No Known Allergies  PAST MEDICAL HISTORY: Past Medical History:  Diagnosis Date  . Breast abscess Nov 2012   left  . Depression 2007  . Diabetes mellitus 2009  . Headache(784.0)   . Hypertension 2009  . Obesity   . Wears glasses     MEDICATIONS AT HOME: Prior to Admission medications   Medication Sig Start Date End Date Taking? Authorizing Provider  albuterol (VENTOLIN HFA) 108 (90 Base) MCG/ACT inhaler Inhale 2 puffs into the lungs every 6 (six) hours as needed for wheezing or shortness of breath. 12/24/19   Thereasa Solo, Dionne Bucy, PA-C  atorvastatin (LIPITOR) 40 MG tablet Take 1 tablet (40 mg total) by mouth daily. 12/24/19   Argentina Donovan, PA-C  Blood Glucose Monitoring Suppl (CONTOUR NEXT MONITOR) w/Device KIT 1 each by Does not apply route 3 (three) times daily  before meals. 06/10/18   Charlott Rakes, MD  cyclobenzaprine (FLEXERIL) 10 MG tablet Take 1 tablet (10 mg total) by mouth at bedtime. 02/07/18   Charlott Rakes, MD  glipiZIDE (GLUCOTROL) 10 MG tablet Take 2 tablets (20 mg total) by mouth 2 (two) times daily before a meal. 12/24/19   Lucee Brissett, Dionne Bucy, PA-C  glucose blood (CONTOUR NEXT TEST) test strip 1 each by Other route 3 (three) times daily before meals. Use as instructed 05/19/19   Charlott Rakes, MD  ibuprofen (ADVIL,MOTRIN) 800 MG tablet Take 1 tablet (800 mg total) by mouth 3 (three) times daily. Patient not taking: Reported on 06/24/2019 12/08/17   Larene Pickett, PA-C  Insulin Pen Needle (BD PEN NEEDLE NANO U/F) 32G X 4 MM MISC USE AS DIRECTED TO  INJECT  VICTOZA 12/24/19   [place] Bula, Dionne Bucy, PA-C  liraglutide (VICTOZA) 18 MG/3ML SOPN Inject 0.3 mLs (1.8 mg total) into the skin daily with breakfast. 12/24/19   Argentina Donovan, PA-C  lisinopril (ZESTRIL) 40 MG tablet Take 1 tablet (40 mg total) by mouth daily. 12/24/19   Argentina Donovan, PA-C  loperamide (IMODIUM A-D) 2 MG capsule 2 mg Q 6 hrs PRN 10/16/17   Ladell Pier, MD  meloxicam (MOBIC) 15 MG tablet Take 15 mg by mouth daily. 09/19/19   [provider]  Microlet Lancets MISC Use to check blood sugar  up to 3 times daily. 05/19/19   Charlott Rakes, MD  neomycin-polymyxin-hydrocortisone (CORTISPORIN) OTIC solution Apply 2-3 drops to the ingrown toenail site twice daily. Cover with band-aid. 06/10/19   Landis Martins, DPM  ondansetron (ZOFRAN) 8 MG tablet Take 1 tablet (8 mg total) by mouth every 8 (eight) hours as needed for nausea or vomiting. 11/27/19   Argentina Donovan, PA-C     Objective:  EXAM:   Vitals:   12/24/19 1550  BP: (!) 142/88  Pulse: 93  Resp: 16  Temp: 97.7 F (36.5 C)  SpO2: 97%  Weight: 235 lb 3.2 oz (106.7 kg)    General appearance : A&OX3. NAD. Non-toxic-appearing HEENT: Atraumatic and Normocephalic.  PERRLA. EOM intact.   Chest/Lungs:   Breathing-non-labored, Good air entry bilaterally, breath sounds normal without rales, rhonchi, or wheezing  CVS: S1 S2 regular, no murmurs, gallops, rubs  Extremities: Bilateral Lower Ext shows no edema, both legs are warm to touch with = pulse throughout Neurology:  CN II-XII grossly intact, Non focal.   Psych:  TP linear. J/I WNL. Normal speech. Appropriate eye contact and affect.  Skin:  No Rash  Data Review Lab Results  Component Value Date   HGBA1C 7.7 (A) 12/24/2019   HGBA1C 7.6 (A) 05/19/2019   HGBA1C 7.6 (A) 02/12/2019     Assessment & Plan   1. Type 2 diabetes mellitus with other specified complication, without long-term current use of insulin (HCC) Not at goal.  Work on exercise even small amounts now that her knee is improving.  Continue to eliminate sugar from her diet and increase water intake.   - Glucose (CBG) - POCT glycosylated hemoglobin (Hb A1C) - Comprehensive metabolic panel - liraglutide (VICTOZA) 18 MG/3ML SOPN; Inject 0.3 mLs (1.8 mg total) into the skin daily with breakfast.  Dispense: 3 pen; Refill: 6 - Insulin Pen Needle (BD PEN NEEDLE NANO U/F) 32G X 4 MM MISC; USE AS DIRECTED TO  INJECT  VICTOZA  Dispense: 100 each; Refill: 4 - glipiZIDE (GLUCOTROL) 10 MG tablet; Take 2 tablets (20 mg total) by mouth 2 (two) times daily before a meal.  Dispense: 120 tablet; Refill: 6 - CBC with Differential/Platelet  2. Dyslipidemia associated with type 2 diabetes mellitus (HCC) - atorvastatin (LIPITOR) 40 MG tablet; Take 1 tablet (40 mg total) by mouth daily.  Dispense: 30 tablet; Refill: 6 - Lipid panel  3. Wheezing - albuterol (VENTOLIN HFA) 108 (90 Base) MCG/ACT inhaler; Inhale 2 puffs into the lungs every 6 (six) hours as needed for wheezing or shortness of breath.  Dispense: 18 g; Refill: 1  4. Essential hypertension Not at goal today but hasn't taken meds today and has been good OOO.   - Comprehensive metabolic panel - CBC with Differential/Platelet -  lisinopril (ZESTRIL) 40 MG tablet; Take 1 tablet (40 mg total) by mouth daily.  Dispense: 30 tablet; Refill: 6  Patient have been counseled extensively about nutrition and exercise  Return in about 4 months (around 04/24/2020) for Dr Margarita Rana for chronic conditions.  The patient was given clear instructions to go to ER or return to medical center if symptoms don't improve, worsen or new problems develop. The patient verbalized understanding. The patient was told to call to get lab results if they haven't heard anything in the next week.     Freeman Caldron, PA-C [facility] [place], [place]   12/24/2019, 4:09 PMPatient ID: Sheila Bishop, female   DOB: [birth year], 49 y.o.  MRN: [id number]

## 2019-12-24 NOTE — Patient Instructions (Signed)
Drink 8-10 glasses of water daily.  Work to eliminate sugars and carbohydrates from your diet  Diabetes Mellitus and Exercise Exercising regularly is important for your overall health, especially when you have diabetes (diabetes mellitus). Exercising is not only about losing weight. It has many other health benefits, such as increasing muscle strength and bone density and reducing body fat and stress. This leads to improved fitness, flexibility, and endurance, all of which result in better overall health. Exercise has additional benefits for people with diabetes, including:  Reducing appetite.  Helping to lower and control blood glucose.  Lowering blood pressure.  Helping to control amounts of fatty substances (lipids) in the blood, such as cholesterol and triglycerides.  Helping the body to respond better to insulin (improving insulin sensitivity).  Reducing how much insulin the body needs.  Decreasing the risk for heart disease by: ? Lowering cholesterol and triglyceride levels. ? Increasing the levels of good cholesterol. ? Lowering blood glucose levels. What is my activity plan? Your health care provider or certified diabetes educator can help you make a plan for the type and frequency of exercise (activity plan) that works for you. Make sure that you:  Do at least 150 minutes of moderate-intensity or vigorous-intensity exercise each week. This could be brisk walking, biking, or water aerobics. ? Do stretching and strength exercises, such as yoga or weightlifting, at least 2 times a week. ? Spread out your activity over at least 3 days of the week.  Get some form of physical activity every day. ? Do not go more than 2 days in a row without some kind of physical activity. ? Avoid being inactive for more than 30 minutes at a time. Take frequent breaks to walk or stretch.  Choose a type of exercise or activity that you enjoy, and set realistic goals.  Start slowly, and gradually  increase the intensity of your exercise over time. What do I need to know about managing my diabetes?   Check your blood glucose before and after exercising. ? If your blood glucose is 240 mg/dL (13.3 mmol/L) or higher before you exercise, check your urine for ketones. If you have ketones in your urine, do not exercise until your blood glucose returns to normal. ? If your blood glucose is 100 mg/dL (5.6 mmol/L) or lower, eat a snack containing 15-20 grams of carbohydrate. Check your blood glucose 15 minutes after the snack to make sure that your level is above 100 mg/dL (5.6 mmol/L) before you start your exercise.  Know the symptoms of low blood glucose (hypoglycemia) and how to treat it. Your risk for hypoglycemia increases during and after exercise. Common symptoms of hypoglycemia can include: ? Hunger. ? Anxiety. ? Sweating and feeling clammy. ? Confusion. ? Dizziness or feeling light-headed. ? Increased heart rate or palpitations. ? Blurry vision. ? Tingling or numbness around the mouth, lips, or tongue. ? Tremors or shakes. ? Irritability.  Keep a rapid-acting carbohydrate snack available before, during, and after exercise to help prevent or treat hypoglycemia.  Avoid injecting insulin into areas of the body that are going to be exercised. For example, avoid injecting insulin into: ? The arms, when playing tennis. ? The legs, when jogging.  Keep records of your exercise habits. Doing this can help you and your health care provider adjust your diabetes management plan as needed. Write down: ? Food that you eat before and after you exercise. ? Blood glucose levels before and after you exercise. ? The type and  amount of exercise you have done. ? When your insulin is expected to peak, if you use insulin. Avoid exercising at times when your insulin is peaking.  When you start a new exercise or activity, work with your health care provider to make sure the activity is safe for you, and  to adjust your insulin, medicines, or food intake as needed.  Drink plenty of water while you exercise to prevent dehydration or heat stroke. Drink enough fluid to keep your urine clear or pale yellow. Summary  Exercising regularly is important for your overall health, especially when you have diabetes (diabetes mellitus).  Exercising has many health benefits, such as increasing muscle strength and bone density and reducing body fat and stress.  Your health care provider or certified diabetes educator can help you make a plan for the type and frequency of exercise (activity plan) that works for you.  When you start a new exercise or activity, work with your health care provider to make sure the activity is safe for you, and to adjust your insulin, medicines, or food intake as needed. This information is not intended to replace advice given to you by your health care provider. Make sure you discuss any questions you have with your health care provider. Document Revised: 04/12/2017 Document Reviewed: 02/28/2016 Elsevier Patient Education  [place].

## 2019-12-25 ENCOUNTER — Other Ambulatory Visit: Payer: Self-pay

## 2019-12-25 ENCOUNTER — Encounter (HOSPITAL_COMMUNITY): Payer: Self-pay

## 2019-12-25 ENCOUNTER — Telehealth: Payer: Self-pay | Admitting: Family Medicine

## 2019-12-25 ENCOUNTER — Ambulatory Visit (HOSPITAL_COMMUNITY)
Admission: EM | Admit: 2019-12-25 | Discharge: 2019-12-25 | Disposition: A | Discharge status: Home / Self Care | Payer: BC Managed Care – PPO | Attending: Family Medicine | Admitting: Family Medicine

## 2019-12-25 DIAGNOSIS — N898 Other specified noninflammatory disorders of vagina: Secondary | ICD-10-CM | POA: Diagnosis not present

## 2019-12-25 DIAGNOSIS — N760 Acute vaginitis: Secondary | ICD-10-CM

## 2019-12-25 LAB — LIPID PANEL
Chol/HDL Ratio: 3.1 ratio (ref 0.0–4.4)
Cholesterol, Total: 134 mg/dL (ref 100–199)
HDL: 43 mg/dL (ref >39)
LDL Chol Calc (NIH): 72 mg/dL (ref 0–99)
Triglycerides: 102 mg/dL (ref 0–149)
VLDL Cholesterol Cal: 19 mg/dL (ref 5–40)

## 2019-12-25 LAB — COMPREHENSIVE METABOLIC PANEL
ALT: 12 IU/L (ref 0–32)
AST: 13 IU/L (ref 0–40)
Albumin/Globulin Ratio: 1.3 (ref 1.2–2.2)
Albumin: 3.7 g/dL — ABNORMAL LOW (ref 3.8–4.8)
Alkaline Phosphatase: 114 IU/L (ref 39–117)
BUN/Creatinine Ratio: 25 — ABNORMAL HIGH (ref 9–23)
BUN: 18 mg/dL (ref 6–24)
Bilirubin Total: <0.2 mg/dL (ref 0.0–1.2)
CO2: 23 mmol/L (ref 20–29)
Calcium: 9.2 mg/dL (ref 8.7–10.2)
Chloride: 104 mmol/L (ref 96–106)
Creatinine, Ser: 0.73 mg/dL (ref 0.57–1.00)
GFR calc Af Amer: 114 mL/min/{1.73_m2} (ref >59)
GFR calc non Af Amer: 99 mL/min/{1.73_m2} (ref >59)
Globulin, Total: 2.8 g/dL (ref 1.5–4.5)
Glucose: 97 mg/dL (ref 65–99)
Potassium: 3.8 mmol/L (ref 3.5–5.2)
Sodium: 141 mmol/L (ref 134–144)
Total Protein: 6.5 g/dL (ref 6.0–8.5)

## 2019-12-25 LAB — CBC WITH DIFFERENTIAL/PLATELET
Basophils Absolute: 0.1 10*3/uL (ref 0.0–0.2)
Basos: 0 %
EOS (ABSOLUTE): 0.4 10*3/uL (ref 0.0–0.4)
Eos: 3 %
Hematocrit: 31.7 % — ABNORMAL LOW (ref 34.0–46.6)
Hemoglobin: 10.4 g/dL — ABNORMAL LOW (ref 11.1–15.9)
Immature Grans (Abs): 0 10*3/uL (ref 0.0–0.1)
Immature Granulocytes: 0 %
Lymphocytes Absolute: 3.2 10*3/uL — ABNORMAL HIGH (ref 0.7–3.1)
Lymphs: 24 %
MCH: 25.3 pg — ABNORMAL LOW (ref 26.6–33.0)
MCHC: 32.8 g/dL (ref 31.5–35.7)
MCV: 77 fL — ABNORMAL LOW (ref 79–97)
Monocytes Absolute: 1 10*3/uL — ABNORMAL HIGH (ref 0.1–0.9)
Monocytes: 8 %
Neutrophils Absolute: 8.7 10*3/uL — ABNORMAL HIGH (ref 1.4–7.0)
Neutrophils: 65 %
Platelets: 297 10*3/uL (ref 150–450)
RBC: 4.11 x10E6/uL (ref 3.77–5.28)
RDW: 14.3 % (ref 11.7–15.4)
WBC: 13.4 10*3/uL — ABNORMAL HIGH (ref 3.4–10.8)

## 2019-12-25 MED ORDER — FLUCONAZOLE 150 MG PO TABS: 150.0000 mg | ORAL_TABLET | Freq: Every day | ORAL | 0 refills | Status: DC

## 2019-12-25 MED ORDER — NYSTATIN-TRIAMCINOLONE 100000-0.1 UNIT/GM-% EX CREA: TOPICAL_CREAM | CUTANEOUS | 0 refills | Status: DC

## 2019-12-25 NOTE — Discharge Instructions (Signed)
Treating you for a yeast infection.  Take the medication as prescribed. Follow up as needed for continued or worsening symptoms

## 2019-12-25 NOTE — ED Triage Notes (Signed)
Pt c/o white, thick vaginal discharge. Pt states vagina burns and is tender. Pt states she tried to use monstat 7 suppository, but it burned too bad when she used it. Pt states there is a foul odor to the discharge.

## 2019-12-25 NOTE — Telephone Encounter (Signed)
Patient called and requested to inform the pcp that she is currently having burning and itching symptoms. Patient requested for an appointment today or tomorrow. Registrar informed the patient that no provider had an appointment available at this moment for that requested time frame. Patient was offered an appointment at a later date, patient declined. Patient stated she would visit urgent care because she wanted it taken care of today.

## 2019-12-26 NOTE — ED Provider Notes (Signed)
[facility]    CSN: [id number] Arrival date & time: 12/25/19  1517      History   Chief Complaint Chief Complaint  Patient presents with  . Vaginal Discharge    HPI NATURI ALARID is a 49 y.o. female.   Patient is a 49 year old female with past medical history of depression, diabetes, headache, hypertension, obesity.  She presents today with approximately a week or more of white, thick vaginal discharge, itching and vaginal irritation.  Reporting burning in the vaginal area.  She attempted to use Monistat 7 suppository but reports this made the symptoms worse.  Reporting mild odor.  Denies any associated bowel pain, back pain, fevers, dysuria, hematuria or urinary frequency.  Did recently change body wash.  Reporting she has had some irregular menstrual cycles over the past year.  Currently sexual active with one partner, unprotected.  Denies any concern for STDs.  ROS per HPI      Past Medical History:  Diagnosis Date  . Breast abscess Nov 2012   left  . Depression 2007  . Diabetes mellitus 2009  . Headache(784.0)   . Hypertension 2009  . Obesity   . Wears glasses     Patient Active Problem List   Diagnosis Date Noted  . Obesity 08/20/2018  . Non-intractable vomiting with nausea 01/04/2017  . Acute non-recurrent frontal sinusitis 01/04/2017  . Irregular menses 02/10/2015  . Dyslipidemia associated with type 2 diabetes mellitus ([place]) 02/10/2015  . Diabetes type 2, uncontrolled ([place]) 11/26/2014  . Pain due to onychomycosis of toenail 11/26/2014  . Hypertension 11/01/2014    Past Surgical History:  Procedure Laterality Date  . BREAST BIOPSY  08/07/2011   Procedure: BREAST BIOPSY;  Surgeon: Joyice Faster. Cornett, MD;  Location: [facility];  Service: General;  Laterality: Left;  BIOPSY BREAST LEFT SIDE  . CESAREAN SECTION  2002    OB History    Gravida  4   Para  3   Term  3   Preterm  0   AB  1   Living  4     SAB  1   TAB      Ectopic        Multiple  1   Live Births               Home Medications    Prior to Admission medications   Medication Sig Start Date End Date Taking? Authorizing Provider  albuterol (VENTOLIN HFA) 108 (90 Base) MCG/ACT inhaler Inhale 2 puffs into the lungs every 6 (six) hours as needed for wheezing or shortness of breath. 12/24/19   Thereasa Solo, Dionne Bucy, PA-C  atorvastatin (LIPITOR) 40 MG tablet Take 1 tablet (40 mg total) by mouth daily. 12/24/19   Argentina Donovan, PA-C  Blood Glucose Monitoring Suppl (CONTOUR NEXT MONITOR) w/Device KIT 1 each by Does not apply route 3 (three) times daily before meals. 06/10/18   Charlott Rakes, MD  cyclobenzaprine (FLEXERIL) 10 MG tablet Take 1 tablet (10 mg total) by mouth at bedtime. 02/07/18   Charlott Rakes, MD  fluconazole (DIFLUCAN) 150 MG tablet Take 1 tablet (150 mg total) by mouth daily. 12/25/19   Loura Halt A, NP  glipiZIDE (GLUCOTROL) 10 MG tablet Take 2 tablets (20 mg total) by mouth 2 (two) times daily before a meal. 12/24/19   McClung, Dionne Bucy, PA-C  glucose blood (CONTOUR NEXT TEST) test strip 1 each by Other route 3 (three) times daily before meals. Use as instructed  05/19/19   Charlott Rakes, MD  ibuprofen (ADVIL,MOTRIN) 800 MG tablet Take 1 tablet (800 mg total) by mouth 3 (three) times daily. Patient not taking: Reported on 06/24/2019 12/08/17   Larene Pickett, PA-C  Insulin Pen Needle (BD PEN NEEDLE NANO U/F) 32G X 4 MM MISC USE AS DIRECTED TO  INJECT  VICTOZA 12/24/19   McClung, Dionne Bucy, PA-C  liraglutide (VICTOZA) 18 MG/3ML SOPN Inject 0.3 mLs (1.8 mg total) into the skin daily with breakfast. 12/24/19   Argentina Donovan, PA-C  lisinopril (ZESTRIL) 40 MG tablet Take 1 tablet (40 mg total) by mouth daily. 12/24/19   Argentina Donovan, PA-C  loperamide (IMODIUM A-D) 2 MG capsule 2 mg Q 6 hrs PRN 10/16/17   Ladell Pier, MD  meloxicam (MOBIC) 15 MG tablet Take 15 mg by mouth daily. 09/19/19   [provider]  Microlet Lancets MISC Use  to check blood sugar up to 3 times daily. 05/19/19   Charlott Rakes, MD  neomycin-polymyxin-hydrocortisone (CORTISPORIN) OTIC solution Apply 2-3 drops to the ingrown toenail site twice daily. Cover with band-aid. 06/10/19   Landis Martins, DPM  nystatin-triamcinolone (MYCOLOG II) cream Apply to affected area daily 12/25/19   Loura Halt A, NP  ondansetron (ZOFRAN) 8 MG tablet Take 1 tablet (8 mg total) by mouth every 8 (eight) hours as needed for nausea or vomiting. 11/27/19   Argentina Donovan, PA-C    Family History Family History  Problem Relation Age of Onset  . Hyperlipidemia Mother   . Hypertension Mother   . Heart disease Mother     Social History Social History   Tobacco Use  . Smoking status: Former Smoker    Quit date: 12/31/2007    Years since quitting: 11.9  . Smokeless tobacco: Never Used  Substance Use Topics  . Alcohol use: No  . Drug use: No     Allergies   Patient has no known allergies.   Review of Systems Review of Systems   Physical Exam Triage Vital Signs ED Triage Vitals  Enc Vitals Group     BP 12/25/19 1539 (!) 163/97     Pulse Rate 12/25/19 1539 (!) 51     Resp 12/25/19 1539 16     Temp 12/25/19 1539 98.7 F (37.1 C)     Temp Source 12/25/19 1539 Oral     SpO2 12/25/19 1539 97 %     Weight 12/25/19 1540 235 lb (106.6 kg)     Height 12/25/19 1540 _0  (1.676 m)     Head Circumference --      Peak Flow --      Pain Score 12/25/19 1539 10     Pain Loc --      Pain Edu? --      Excl. in [place]? --    No data found.  Updated Vital Signs BP (!) 163/97   Pulse (!) 51   Temp 98.7 F (37.1 C) (Oral)   Resp 16   Ht _1  (1.676 m)   Wt 235 lb (106.6 kg)   SpO2 97%   BMI 37.93 kg/m   Visual Acuity Right Eye Distance:   Left Eye Distance:   Bilateral Distance:    Right Eye Near:   Left Eye Near:    Bilateral Near:     Physical Exam Vitals and nursing note reviewed.  Constitutional:      General: She is not in acute distress.     Appearance: Normal  appearance. She is not ill-appearing, toxic-appearing or diaphoretic.  HENT:     Head: Normocephalic.     Nose: Nose normal.  Eyes:     Conjunctiva/sclera: Conjunctivae normal.  Pulmonary:     Effort: Pulmonary effort is normal.  Musculoskeletal:        General: Normal range of motion.     Cervical back: Normal range of motion.  Skin:    General: Skin is warm and dry.     Findings: No rash.  Neurological:     Mental Status: She is alert.  Psychiatric:        Mood and Affect: Mood normal.      UC Treatments / Results  Labs (all labs ordered are listed, but only abnormal results are displayed) Labs Reviewed - No data to display  EKG   Radiology No results found.  Procedures Procedures (including critical care time)  Medications Ordered in UC Medications - No data to display  Initial Impression / Assessment and Plan / UC Course  I have reviewed the triage vital signs and the nursing notes.  Pertinent labs & imaging results that were available during my care of the patient were reviewed by me and considered in my medical decision making (see chart for details).     Vaginal discharge with acute vaginitis.-Treating for yeast infection based on symptoms Treating with Diflucan and Mycolog cream Deferred swab today. Patient not concerned for STDs. Final Clinical Impressions(s) / UC Diagnoses   Final diagnoses:  Vaginal discharge  Acute vaginitis     Discharge Instructions     Treating you for a yeast infection.  Take the medication as prescribed. Follow up as needed for continued or worsening symptoms    ED Prescriptions    Medication Sig Dispense Auth. Provider   fluconazole (DIFLUCAN) 150 MG tablet Take 1 tablet (150 mg total) by mouth daily. 2 tablet Trinaty Bundrick A, NP   nystatin-triamcinolone (MYCOLOG II) cream Apply to affected area daily 15 g Fuquan Wilson A, NP     PDMP not reviewed this encounter.   Loura Halt A, NP 12/26/19  1012

## 2020-03-02 MED FILL — MICROLET LANCETS MISC: 30 days supply | Qty: 100 | Fill #3

## 2020-03-02 MED FILL — CONTOUR NEXT STRIPS: 33 days supply | Qty: 100 | Fill #3

## 2020-04-26 ENCOUNTER — Ambulatory Visit: Payer: BC Managed Care – PPO | Attending: Family Medicine | Admitting: Family Medicine

## 2020-04-26 ENCOUNTER — Encounter: Payer: Self-pay | Admitting: Family Medicine

## 2020-04-26 ENCOUNTER — Other Ambulatory Visit: Payer: Self-pay

## 2020-04-26 VITALS — BP 144/87 | HR 93 | Temp 97.7°F | Ht 66.0 in | Wt 230.2 lb

## 2020-04-26 DIAGNOSIS — L84 Corns and callosities: Secondary | ICD-10-CM | POA: Diagnosis not present

## 2020-04-26 DIAGNOSIS — I10 Essential (primary) hypertension: Secondary | ICD-10-CM | POA: Diagnosis not present

## 2020-04-26 DIAGNOSIS — E1169 Type 2 diabetes mellitus with other specified complication: Secondary | ICD-10-CM | POA: Diagnosis not present

## 2020-04-26 DIAGNOSIS — Z6837 Body mass index (BMI) 37.0-37.9, adult: Secondary | ICD-10-CM

## 2020-04-26 DIAGNOSIS — E785 Hyperlipidemia, unspecified: Secondary | ICD-10-CM

## 2020-04-26 DIAGNOSIS — E66812 Obesity, class 2: Secondary | ICD-10-CM

## 2020-04-26 LAB — POCT GLYCOSYLATED HEMOGLOBIN (HGB A1C): Hemoglobin A1C: 7.1 % — AB (ref 4.0–5.6)

## 2020-04-26 LAB — GLUCOSE, POCT (MANUAL RESULT ENTRY): POC Glucose: 110 mg/dl — AB (ref 70–99)

## 2020-04-26 MED ORDER — GLIPIZIDE 10 MG PO TABS: 20.0000 mg | ORAL_TABLET | Freq: Two times a day (BID) | ORAL | 6 refills | Status: DC

## 2020-04-26 MED ORDER — ATORVASTATIN CALCIUM 40 MG PO TABS: 40.0000 mg | ORAL_TABLET | Freq: Every day | ORAL | 6 refills | Status: DC

## 2020-04-26 MED ORDER — LISINOPRIL-HYDROCHLOROTHIAZIDE 20-12.5 MG PO TABS: 2.0000 | ORAL_TABLET | Freq: Every day | ORAL | 6 refills | Status: DC

## 2020-04-26 MED ORDER — VICTOZA 18 MG/3ML ~~LOC~~ SOPN: 1.8000 mg | PEN_INJECTOR | Freq: Every day | SUBCUTANEOUS | 6 refills | Status: DC

## 2020-04-26 NOTE — Patient Instructions (Signed)
Diabetes Mellitus and Foot Care Foot care is an important part of your health, especially when you have diabetes. Diabetes may cause you to have problems because of poor blood flow (circulation) to your feet and legs, which can cause your skin to:  Become thinner and drier.  Break more easily.  Heal more slowly.  Peel and crack. You may also have nerve damage (neuropathy) in your legs and feet, causing decreased feeling in them. This means that you may not notice minor injuries to your feet that could lead to more serious problems. Noticing and addressing any potential problems early is the best way to prevent future foot problems. How to care for your feet Foot hygiene  Wash your feet daily with warm water and mild soap. Do not use hot water. Then, pat your feet and the areas between your toes until they are completely dry. Do not soak your feet as this can dry your skin.  Trim your toenails straight across. Do not dig under them or around the cuticle. File the edges of your nails with an emery board or nail file.  Apply a moisturizing lotion or petroleum jelly to the skin on your feet and to dry, brittle toenails. Use lotion that does not contain alcohol and is unscented. Do not apply lotion between your toes. Shoes and socks  Wear clean socks or stockings every day. Make sure they are not too tight. Do not wear knee-high stockings since they may decrease blood flow to your legs.  Wear shoes that fit properly and have enough cushioning. Always look in your shoes before you put them on to be sure there are no objects inside.  To break in new shoes, wear them for just a few hours a day. This prevents injuries on your feet. Wounds, scrapes, corns, and calluses  Check your feet daily for blisters, cuts, bruises, sores, and redness. If you cannot see the bottom of your feet, use a mirror or ask someone for help.  Do not cut corns or calluses or try to remove them with medicine.  If you  find a minor scrape, cut, or break in the skin on your feet, keep it and the skin around it clean and dry. You may clean these areas with mild soap and water. Do not clean the area with peroxide, alcohol, or iodine.  If you have a wound, scrape, corn, or callus on your foot, look at it several times a day to make sure it is healing and not infected. Check for: ? Redness, swelling, or pain. ? Fluid or blood. ? Warmth. ? Pus or a bad smell. General instructions  Do not cross your legs. This may decrease blood flow to your feet.  Do not use heating pads or hot water bottles on your feet. They may burn your skin. If you have lost feeling in your feet or legs, you may not know this is happening until it is too late.  Protect your feet from hot and cold by wearing shoes, such as at the beach or on hot pavement.  Schedule a complete foot exam at least once a year (annually) or more often if you have foot problems. If you have foot problems, report any cuts, sores, or bruises to your health care provider immediately. Contact a health care provider if:  You have a medical condition that increases your risk of infection and you have any cuts, sores, or bruises on your feet.  You have an injury that is not   healing.  You have redness on your legs or feet.  You feel burning or tingling in your legs or feet.  You have pain or cramps in your legs and feet.  Your legs or feet are numb.  Your feet always feel cold.  You have pain around a toenail. Get help right away if:  You have a wound, scrape, corn, or callus on your foot and: ? You have pain, swelling, or redness that gets worse. ? You have fluid or blood coming from the wound, scrape, corn, or callus. ? Your wound, scrape, corn, or callus feels warm to the touch. ? You have pus or a bad smell coming from the wound, scrape, corn, or callus. ? You have a fever. ? You have a red line going up your leg. Summary  Check your feet every day  for cuts, sores, red spots, swelling, and blisters.  Moisturize feet and legs daily.  Wear shoes that fit properly and have enough cushioning.  If you have foot problems, report any cuts, sores, or bruises to your health care provider immediately.  Schedule a complete foot exam at least once a year (annually) or more often if you have foot problems. This information is not intended to replace advice given to you by your health care provider. Make sure you discuss any questions you have with your health care provider. Document Revised: 06/11/2019 Document Reviewed: 10/20/2016 Elsevier Patient Education  2020 Elsevier Inc.  

## 2020-04-26 NOTE — Progress Notes (Signed)
Subjective:  Patient ID: Sheila Bishop, female    DOB: 1971-06-12  Age: 49 y.o. MRN: [id number]  CC: Follow-up (Pt. is here for diabetes follow up. Pt. need medication refill. )   HPI Sheila Bishop  is a 49 year old female with history of type 2 diabetes mellitus (A1c 7.1), hypertension, hyperlipidemia who presents today for a follow-up visit. She denies presence of hypoglycemia, numbness in extremities and has been compliant with a diabetic diet and exercise. Eye exam comes up next month.  Doing well on her antihypertensive however her blood pressure is slightly elevated above goal and this is concerning to her. She is also on a statin. Work has been a little bit stressful but she is otherwise doing well and informs me she got engaged last month.  Past Medical History:  Diagnosis Date  . Breast abscess Nov 2012   left  . Depression 2007  . Diabetes mellitus 2009  . Headache(784.0)   . Hypertension 2009  . Obesity   . Wears glasses     Past Surgical History:  Procedure Laterality Date  . BREAST BIOPSY  08/07/2011   Procedure: BREAST BIOPSY;  Surgeon: Joyice Faster. Cornett, MD;  Location: [place];  Service: General;  Laterality: Left;  BIOPSY BREAST LEFT SIDE  . CESAREAN SECTION  2002    Family History  Problem Relation Age of Onset  . Hyperlipidemia Mother   . Hypertension Mother   . Heart disease Mother     No Known Allergies  Outpatient Medications Prior to Visit  Medication Sig Dispense Refill  . albuterol (VENTOLIN HFA) 108 (90 Base) MCG/ACT inhaler Inhale 2 puffs into the lungs every 6 (six) hours as needed for wheezing or shortness of breath. 18 g 1  . atorvastatin (LIPITOR) 40 MG tablet Take 1 tablet (40 mg total) by mouth daily. 30 tablet 6  . Blood Glucose Monitoring Suppl (CONTOUR NEXT MONITOR) w/Device KIT 1 each by Does not apply route 3 (three) times daily before meals. 1 kit 0  . cyclobenzaprine (FLEXERIL) 10 MG tablet Take 1 tablet (10 mg total) by  mouth at bedtime. 30 tablet 1  . fluconazole (DIFLUCAN) 150 MG tablet Take 1 tablet (150 mg total) by mouth daily. 2 tablet 0  . glipiZIDE (GLUCOTROL) 10 MG tablet Take 2 tablets (20 mg total) by mouth 2 (two) times daily before a meal. 120 tablet 6  . glucose blood (CONTOUR NEXT TEST) test strip 1 each by Other route 3 (three) times daily before meals. Use as instructed 100 each 12  . Insulin Pen Needle (BD PEN NEEDLE NANO U/F) 32G X 4 MM MISC USE AS DIRECTED TO  INJECT  VICTOZA 100 each 4  . liraglutide (VICTOZA) 18 MG/3ML SOPN Inject 0.3 mLs (1.8 mg total) into the skin daily with breakfast. 3 pen 6  . lisinopril (ZESTRIL) 40 MG tablet Take 1 tablet (40 mg total) by mouth daily. 30 tablet 6  . loperamide (IMODIUM A-D) 2 MG capsule 2 mg Q 6 hrs PRN 12 capsule 0  . meloxicam (MOBIC) 15 MG tablet Take 15 mg by mouth daily.    . Microlet Lancets MISC Use to check blood sugar up to 3 times daily. 100 each 11  . neomycin-polymyxin-hydrocortisone (CORTISPORIN) OTIC solution Apply 2-3 drops to the ingrown toenail site twice daily. Cover with band-aid. 10 mL 0  . nystatin-triamcinolone (MYCOLOG II) cream Apply to affected area daily 15 g 0  . ondansetron (ZOFRAN) 8 MG tablet Take 1  tablet (8 mg total) by mouth every 8 (eight) hours as needed for nausea or vomiting. 30 tablet 0  . ibuprofen (ADVIL,MOTRIN) 800 MG tablet Take 1 tablet (800 mg total) by mouth 3 (three) times daily. (Patient not taking: Reported on 06/24/2019) 21 tablet 0   No facility-administered medications prior to visit.     ROS Review of Systems  Constitutional: Negative for activity change, appetite change and fatigue.  HENT: Negative for congestion, sinus pressure and sore throat.   Eyes: Negative for visual disturbance.  Respiratory: Negative for cough, chest tightness, shortness of breath and wheezing.   Cardiovascular: Negative for chest pain and palpitations.  Gastrointestinal: Negative for abdominal distention, abdominal  pain and constipation.  Endocrine: Negative for polydipsia.  Genitourinary: Negative for dysuria and frequency.  Musculoskeletal: Negative for arthralgias and back pain.  Skin: Negative for rash.  Neurological: Negative for tremors, light-headedness and numbness.  Hematological: Does not bruise/bleed easily.  Psychiatric/Behavioral: Negative for agitation and behavioral problems.    Objective:  BP (!) 144/87 (BP Location: Left Arm, Patient Position: Sitting, Cuff Size: Large)   Pulse 93   Temp 97.7 F (36.5 C) (Temporal)   Ht '5\' 6"'$  (1.676 m)   Wt (!) 230 lb 3.2 oz (104.4 kg)   SpO2 98%   BMI 37.16 kg/m   BP/Weight 04/26/2020 12/25/2019 03/26/6388  Systolic BP 373 428 768  Diastolic BP 87 97 88  Wt. (Lbs) 230.2 235 235.2  BMI 37.16 37.93 37.96      Physical Exam Constitutional:      Appearance: She is well-developed.  Neck:     Vascular: No JVD.  Cardiovascular:     Rate and Rhythm: Normal rate.     Heart sounds: Normal heart sounds. No murmur heard.   Pulmonary:     Effort: Pulmonary effort is normal.     Breath sounds: Normal breath sounds. No wheezing or rales.  Chest:     Chest wall: No tenderness.  Abdominal:     General: Bowel sounds are normal. There is no distension.     Palpations: Abdomen is soft. There is no mass.     Tenderness: There is no abdominal tenderness.  Musculoskeletal:        General: Normal range of motion.     Right lower leg: No edema.     Left lower leg: No edema.  Neurological:     Mental Status: She is alert and oriented to person, place, and time.  Psychiatric:        Mood and Affect: Mood normal.   Sensory exam of the foot is normal, tested with the monofilament. Good pulses, no ulcers, calluses noted on left sole, good peripheral pulses.   CMP Latest Ref Rng & Units 12/24/2019 05/19/2019 02/12/2019  Glucose 65 - 99 mg/dL 97 98 73  BUN 6 - 24 mg/dL 18 26(H) 22  Creatinine 0.57 - 1.00 mg/dL 0.73 0.92 0.73  Sodium 134 - 144 mmol/L  141 143 139  Potassium 3.5 - 5.2 mmol/L 3.8 4.2 3.9  Chloride 96 - 106 mmol/L 104 104 101  CO2 20 - 29 mmol/L '23 25 24  '$ Calcium 8.7 - 10.2 mg/dL 9.2 9.6 9.2  Total Protein 6.0 - 8.5 g/dL 6.5 - 6.7  Total Bilirubin 0.0 - 1.2 mg/dL <0.2 - 0.3  Alkaline Phos 39 - 117 IU/L 114 - 105  AST 0 - 40 IU/L 13 - 12  ALT 0 - 32 IU/L 12 - 13    Lipid  Panel     Component Value Date/Time   CHOL 134 12/24/2019 1618   TRIG 102 12/24/2019 1618   HDL 43 12/24/2019 1618   CHOLHDL 3.1 12/24/2019 1618   CHOLHDL 3.9 11/01/2016 1608   VLDL 18 06/16/2010 2054   LDLCALC 72 12/24/2019 1618   LDLDIRECT 131 (H) 04/21/2010 2016    CBC    Component Value Date/Time   WBC 13.4 (H) 12/24/2019 1618   WBC 14.6 (H) 12/08/2017 2135   RBC 4.11 12/24/2019 1618   RBC 3.91 12/08/2017 2135   HGB 10.4 (L) 12/24/2019 1618   HCT 31.7 (L) 12/24/2019 1618   PLT 297 12/24/2019 1618   MCV 77 (L) 12/24/2019 1618   MCH 25.3 (L) 12/24/2019 1618   MCH 26.1 12/08/2017 2135   MCHC 32.8 12/24/2019 1618   MCHC 33.1 12/08/2017 2135   RDW 14.3 12/24/2019 1618   LYMPHSABS 3.2 (H) 12/24/2019 1618   MONOABS 610 02/23/2016 1052   EOSABS 0.4 12/24/2019 1618   BASOSABS 0.1 12/24/2019 1618    Lab Results  Component Value Date   HGBA1C 7.1 (A) 04/26/2020    Assessment & Plan:  1. Type 2 diabetes mellitus with other specified complication, without long-term current use of insulin (HCC) Controlled with A1c of 7.1 Continue current management Counseled on Diabetic diet, my plate method, 993 minutes of moderate intensity exercise/week Blood sugar logs with fasting goals of 80-120 mg/dl, random of less than 180 and in the event of sugars less than 60 mg/dl or greater than 400 mg/dl encouraged to notify the clinic. Advised on the need for annual eye exams, annual foot exams, Pneumonia vaccine. - Glucose (CBG) - HgB A1c - liraglutide (VICTOZA) 18 MG/3ML SOPN; Inject 0.3 mLs (1.8 mg total) into the skin daily with breakfast.   Dispense: 3 pen; Refill: 6 - glipiZIDE (GLUCOTROL) 10 MG tablet; Take 2 tablets (20 mg total) by mouth 2 (two) times daily before a meal.  Dispense: 120 tablet; Refill: 6  2. Dyslipidemia associated with type 2 diabetes mellitus (HCC) Controlled Low-cholesterol diet - atorvastatin (LIPITOR) 40 MG tablet; Take 1 tablet (40 mg total) by mouth daily.  Dispense: 30 tablet; Refill: 6  3. Callus of foot Previously referred to podiatry but she just had her toenails clipped Advised her to call the podiatry office for another appointment for evaluation of callus  4. Class 2 severe obesity due to excess calories with serious comorbidity and body mass index (BMI) of 37.0 to 37.9 in adult [facility]) Counseled on reducing portion sizes, avoiding meals  5. Essential hypertension Not at goal We will switch from lisinopril to lisinopril/HCTZ Counseled on blood pressure goal of less than 130/80, low-sodium, DASH diet, medication compliance, 150 minutes of moderate intensity exercise per week. Discussed medication compliance, adverse effects. - lisinopril-hydrochlorothiazide (ZESTORETIC) 20-12.5 MG tablet; Take 2 tablets by mouth daily.  Dispense: 30 tablet; Refill: 6   Return in about 4 months (around 08/27/2020) for chronic disease management.   Charlott Rakes, MD, FAAFP. [facility] and [place], [place]   04/26/2020, 4:34 PM

## 2020-05-21 ENCOUNTER — Ambulatory Visit (HOSPITAL_COMMUNITY)
Admission: EM | Admit: 2020-05-21 | Discharge: 2020-05-21 | Disposition: A | Discharge status: Home / Self Care | Payer: BC Managed Care – PPO | Attending: Family Medicine | Admitting: Family Medicine

## 2020-05-21 ENCOUNTER — Encounter (HOSPITAL_COMMUNITY): Payer: Self-pay

## 2020-05-21 ENCOUNTER — Other Ambulatory Visit: Payer: Self-pay

## 2020-05-21 DIAGNOSIS — Z7901 Long term (current) use of anticoagulants: Secondary | ICD-10-CM | POA: Insufficient documentation

## 2020-05-21 DIAGNOSIS — B349 Viral infection, unspecified: Secondary | ICD-10-CM | POA: Insufficient documentation

## 2020-05-21 DIAGNOSIS — Z794 Long term (current) use of insulin: Secondary | ICD-10-CM | POA: Diagnosis not present

## 2020-05-21 DIAGNOSIS — E119 Type 2 diabetes mellitus without complications: Secondary | ICD-10-CM | POA: Diagnosis not present

## 2020-05-21 DIAGNOSIS — R197 Diarrhea, unspecified: Secondary | ICD-10-CM | POA: Diagnosis not present

## 2020-05-21 DIAGNOSIS — Z87891 Personal history of nicotine dependence: Secondary | ICD-10-CM | POA: Insufficient documentation

## 2020-05-21 DIAGNOSIS — I10 Essential (primary) hypertension: Secondary | ICD-10-CM | POA: Insufficient documentation

## 2020-05-21 DIAGNOSIS — Z6837 Body mass index (BMI) 37.0-37.9, adult: Secondary | ICD-10-CM | POA: Insufficient documentation

## 2020-05-21 DIAGNOSIS — Z79899 Other long term (current) drug therapy: Secondary | ICD-10-CM | POA: Insufficient documentation

## 2020-05-21 DIAGNOSIS — E785 Hyperlipidemia, unspecified: Secondary | ICD-10-CM | POA: Diagnosis not present

## 2020-05-21 DIAGNOSIS — Z20822 Contact with and (suspected) exposure to covid-19: Secondary | ICD-10-CM | POA: Diagnosis not present

## 2020-05-21 LAB — SARS CORONAVIRUS 2 (TAT 6-24 HRS): SARS Coronavirus 2: NEGATIVE

## 2020-05-21 MED ORDER — ONDANSETRON 4 MG PO TBDP
4.0000 mg | ORAL_TABLET | Freq: Once | ORAL | Status: AC
  Administered 2020-05-21: 4 mg via ORAL

## 2020-05-21 MED ORDER — ONDANSETRON 4 MG PO TBDP: 4.0000 mg | ORAL_TABLET | Freq: Three times a day (TID) | ORAL | 0 refills | Status: DC | PRN

## 2020-05-21 MED ORDER — ONDANSETRON 4 MG PO TBDP
ORAL_TABLET | ORAL | Status: AC
  Filled 2020-05-21: qty 1

## 2020-05-21 NOTE — ED Triage Notes (Signed)
Pt c/o diarrhea since Wednesday, n/v onset last night, HA, LUQ "hotness". Last emesis in waiting room at [facility].  Denies fever, chills, congestion, runny nose, sore throat, body aches. Has not been tolerating po fluids well; has been taking her DM2 meds and reports her CBG this morning was 120. Pt states her employer is requiring her to have a COVID test.

## 2020-05-21 NOTE — ED Provider Notes (Signed)
[place]    CSN: [id number] Arrival date & time: 05/21/20  1328      History   Chief Complaint Chief Complaint  Patient presents with  . Emesis  . Diarrhea    HPI Sheila Bishop is a 49 y.o. female.   Patient is a 49 year old female presents today with nausea, vomiting and diarrhea since Wednesday.  Started Wednesday which is diarrhea and today has had 2 episodes of vomiting.  Denies any specific abdominal pain.  Has had headache, chills and sweating.  Recent sick contact at work.  She is use Imodium without much relief.  Was able to hold down some chicken noodle soup and saltine crackers.  No fever, cough, chest congestion, nasal congestion, rhinorrhea.  Sent here for Covid testing by employer.     Past Medical History:  Diagnosis Date  . Breast abscess Nov 2012   left  . Depression 2007  . Diabetes mellitus 2009  . Headache(784.0)   . Hypertension 2009  . Obesity   . Wears glasses     Patient Active Problem List   Diagnosis Date Noted  . Class 2 severe obesity due to excess calories with serious comorbidity and body mass index (BMI) of 37.0 to 37.9 in adult ([place]) 08/20/2018  . Non-intractable vomiting with nausea 01/04/2017  . Acute non-recurrent frontal sinusitis 01/04/2017  . Irregular menses 02/10/2015  . Dyslipidemia associated with type 2 diabetes mellitus ([place]) 02/10/2015  . Diabetes type 2, uncontrolled ([place]) 11/26/2014  . Pain due to onychomycosis of toenail 11/26/2014  . Hypertension 11/01/2014    Past Surgical History:  Procedure Laterality Date  . BREAST BIOPSY  08/07/2011   Procedure: BREAST BIOPSY;  Surgeon: Joyice Faster. Cornett, MD;  Location: [facility];  Service: General;  Laterality: Left;  BIOPSY BREAST LEFT SIDE  . CESAREAN SECTION  2002    OB History    Gravida  4   Para  3   Term  3   Preterm  0   AB  1   Living  4     SAB  1   TAB      Ectopic      Multiple  1   Live Births               Home  Medications    Prior to Admission medications   Medication Sig Start Date End Date Taking? Authorizing Provider  albuterol (VENTOLIN HFA) 108 (90 Base) MCG/ACT inhaler Inhale 2 puffs into the lungs every 6 (six) hours as needed for wheezing or shortness of breath. 12/24/19  Yes McClung, Angela M, PA-C  atorvastatin (LIPITOR) 40 MG tablet Take 1 tablet (40 mg total) by mouth daily. 04/26/20  Yes Charlott Rakes, MD  glipiZIDE (GLUCOTROL) 10 MG tablet Take 2 tablets (20 mg total) by mouth 2 (two) times daily before a meal. 04/26/20  Yes Newlin, Enobong, MD  liraglutide (VICTOZA) 18 MG/3ML SOPN Inject 0.3 mLs (1.8 mg total) into the skin daily with breakfast. 04/26/20  Yes Newlin, Enobong, MD  lisinopril-hydrochlorothiazide (ZESTORETIC) 20-12.5 MG tablet Take 2 tablets by mouth daily. 04/26/20  Yes Charlott Rakes, MD  loperamide (IMODIUM A-D) 2 MG capsule 2 mg Q 6 hrs PRN 10/16/17  Yes Ladell Pier, MD  Blood Glucose Monitoring Suppl (CONTOUR NEXT MONITOR) w/Device KIT 1 each by Does not apply route 3 (three) times daily before meals. 06/10/18   Charlott Rakes, MD  fluconazole (DIFLUCAN) 150 MG tablet Take 1 tablet (150  mg total) by mouth daily. 12/25/19   Torrion Witter, Tressia Miners A, NP  glucose blood (CONTOUR NEXT TEST) test strip 1 each by Other route 3 (three) times daily before meals. Use as instructed 05/19/19   Charlott Rakes, MD  ibuprofen (ADVIL,MOTRIN) 800 MG tablet Take 1 tablet (800 mg total) by mouth 3 (three) times daily. Patient not taking: Reported on 06/24/2019 12/08/17   Larene Pickett, PA-C  Insulin Pen Needle (BD PEN NEEDLE NANO U/F) 32G X 4 MM MISC USE AS DIRECTED TO  INJECT  VICTOZA 12/24/19   McClung, Dionne Bucy, PA-C  meloxicam (MOBIC) 15 MG tablet Take 15 mg by mouth daily. 09/19/19   [provider]  Microlet Lancets MISC Use to check blood sugar up to 3 times daily. 05/19/19   Charlott Rakes, MD  neomycin-polymyxin-hydrocortisone (CORTISPORIN) OTIC solution Apply 2-3 drops to the  ingrown toenail site twice daily. Cover with band-aid. 06/10/19   Landis Martins, DPM  nystatin-triamcinolone (MYCOLOG II) cream Apply to affected area daily 12/25/19   Loura Halt A, NP  ondansetron (ZOFRAN ODT) 4 MG disintegrating tablet Take 1 tablet (4 mg total) by mouth every 8 (eight) hours as needed for nausea or vomiting. 05/21/20   Orvan July, NP    Family History Family History  Problem Relation Age of Onset  . Hyperlipidemia Mother   . Hypertension Mother   . Heart disease Mother     Social History Social History   Tobacco Use  . Smoking status: Former Smoker    Quit date: 12/31/2007    Years since quitting: 12.3  . Smokeless tobacco: Never Used  Substance Use Topics  . Alcohol use: No  . Drug use: No     Allergies   Patient has no known allergies.   Review of Systems Review of Systems   Physical Exam Triage Vital Signs ED Triage Vitals [05/21/20 1437]  Enc Vitals Group     BP (!) 154/95     Pulse Rate 99     Resp 20     Temp 98.4 F (36.9 C)     Temp Source Oral     SpO2 100 %     Weight      Height      Head Circumference      Peak Flow      Pain Score 10     Pain Loc      Pain Edu?      Excl. in [place]?    No data found.  Updated Vital Signs BP (!) 154/95 (BP Location: Left Arm)   Pulse 99   Temp 98.4 F (36.9 C) (Oral)   Resp 20   SpO2 100%   Visual Acuity Right Eye Distance:   Left Eye Distance:   Bilateral Distance:    Right Eye Near:   Left Eye Near:    Bilateral Near:     Physical Exam Vitals and nursing note reviewed.  Constitutional:      General: She is not in acute distress.    Appearance: Normal appearance. She is not ill-appearing, toxic-appearing or diaphoretic.  HENT:     Head: Normocephalic.     Nose: Nose normal.  Eyes:     Conjunctiva/sclera: Conjunctivae normal.  Pulmonary:     Effort: Pulmonary effort is normal.  Musculoskeletal:        General: Normal range of motion.     Cervical back: Normal range of  motion.  Skin:    General: Skin is  warm and dry.     Findings: No rash.  Neurological:     Mental Status: She is alert.  Psychiatric:        Mood and Affect: Mood normal.      UC Treatments / Results  Labs (all labs ordered are listed, but only abnormal results are displayed) Labs Reviewed  SARS CORONAVIRUS 2 (TAT 6-24 HRS)    EKG   Radiology No results found.  Procedures Procedures (including critical care time)  Medications Ordered in UC Medications  ondansetron (ZOFRAN-ODT) disintegrating tablet 4 mg (4 mg Oral Given 05/21/20 1446)    Initial Impression / Assessment and Plan / UC Course  I have reviewed the triage vital signs and the nursing notes.  Pertinent labs & imaging results that were available during my care of the patient were reviewed by me and considered in my medical decision making (see chart for details).     Viral illness Most likely viral gastroenteritis versus Covid Will prescribe Zofran to use as needed Recommended sip fluids to ensure hydration Advance diet as tolerated Work note given Follow up as needed for continued or worsening symptoms  Final Clinical Impressions(s) / UC Diagnoses   Final diagnoses:  Viral illness     Discharge Instructions     This is most likely viral Zofran as needed for nausea and vomiting Rest, hydrate. Advance diet as tolerated.  Follow up as needed for continued or worsening symptoms     ED Prescriptions    Medication Sig Dispense Auth. Provider   ondansetron (ZOFRAN ODT) 4 MG disintegrating tablet Take 1 tablet (4 mg total) by mouth every 8 (eight) hours as needed for nausea or vomiting. 20 tablet Loura Halt A, NP     PDMP not reviewed this encounter.   Orvan July, NP 05/21/20 1525

## 2020-05-21 NOTE — Discharge Instructions (Signed)
This is most likely viral Zofran as needed for nausea and vomiting Rest, hydrate. Advance diet as tolerated.  Follow up as needed for continued or worsening symptoms

## 2020-06-21 ENCOUNTER — Other Ambulatory Visit: Payer: Self-pay | Admitting: Family Medicine

## 2020-06-21 DIAGNOSIS — E1169 Type 2 diabetes mellitus with other specified complication: Secondary | ICD-10-CM

## 2020-06-22 MED FILL — CONTOUR NEXT STRIPS: 33 days supply | Qty: 100 | Fill #0

## 2020-07-19 ENCOUNTER — Ambulatory Visit: Payer: BC Managed Care – PPO | Admitting: Physician Assistant

## 2020-07-19 ENCOUNTER — Other Ambulatory Visit: Payer: Self-pay

## 2020-07-19 VITALS — BP 136/87 | HR 98 | Temp 98.2°F | Resp 18 | Ht 66.0 in

## 2020-07-19 DIAGNOSIS — L03032 Cellulitis of left toe: Secondary | ICD-10-CM

## 2020-07-19 DIAGNOSIS — E11649 Type 2 diabetes mellitus with hypoglycemia without coma: Secondary | ICD-10-CM

## 2020-07-19 LAB — GLUCOSE, POCT (MANUAL RESULT ENTRY): POC Glucose: 85 mg/dl (ref 70–99)

## 2020-07-19 MED ORDER — MUPIROCIN CALCIUM 2 % EX CREA: 1.0000 "application " | TOPICAL_CREAM | Freq: Two times a day (BID) | CUTANEOUS | 0 refills | Status: DC

## 2020-07-19 NOTE — Progress Notes (Signed)
Established Patient Office Visit  Subjective:  Patient ID: Sheila Bishop, female    DOB: 1970-11-24  Age: 49 y.o. MRN: [id number]  CC:  Chief Complaint  Patient presents with  . Toe Injury    LEFT GREAT TOE    HPI Sheila Bishop reports that shut the refrigerator door on her toe today and cut the end of it. States that she has been keeping it clean and covered.  States that it is very tender.  States that she is concerned due to having diabetes.  No loss of movement or limited range of motion  Past Medical History:  Diagnosis Date  . Breast abscess Nov 2012   left  . Depression 2007  . Diabetes mellitus 2009  . Headache(784.0)   . Hypertension 2009  . Obesity   . Wears glasses     Past Surgical History:  Procedure Laterality Date  . BREAST BIOPSY  08/07/2011   Procedure: BREAST BIOPSY;  Surgeon: Joyice Faster. Cornett, MD;  Location: [facility];  Service: General;  Laterality: Left;  BIOPSY BREAST LEFT SIDE  . CESAREAN SECTION  2002    Family History  Problem Relation Age of Onset  . Hyperlipidemia Mother   . Hypertension Mother   . Heart disease Mother     Social History   Socioeconomic History  . Marital status: Significant Other    Spouse name: Not on file  . Number of children: Not on file  . Years of education: Not on file  . Highest education level: Not on file  Occupational History  . Not on file  Tobacco Use  . Smoking status: Former Smoker    Quit date: 12/31/2007    Years since quitting: 12.5  . Smokeless tobacco: Never Used  Substance and Sexual Activity  . Alcohol use: No  . Drug use: No  . Sexual activity: Not Currently  Other Topics Concern  . Not on file  Social History Narrative  . Not on file   Social Determinants of Health   Financial Resource Strain:   . Difficulty of Paying Living Expenses: Not on file  Food Insecurity:   . Worried About Charity fundraiser in the Last Year: Not on file  . Ran Out of Food in the Last Year:  Not on file  Transportation Needs:   . Lack of Transportation (Medical): Not on file  . Lack of Transportation (Non-Medical): Not on file  Physical Activity:   . Days of Exercise per Week: Not on file  . Minutes of Exercise per Session: Not on file  Stress:   . Feeling of Stress : Not on file  Social Connections:   . Frequency of Communication with Friends and Family: Not on file  . Frequency of Social Gatherings with Friends and Family: Not on file  . Attends Religious Services: Not on file  . Active Member of Clubs or Organizations: Not on file  . Attends Archivist Meetings: Not on file  . Marital Status: Not on file  Intimate Partner Violence:   . Fear of Current or Ex-Partner: Not on file  . Emotionally Abused: Not on file  . Physically Abused: Not on file  . Sexually Abused: Not on file    Outpatient Medications Prior to Visit  Medication Sig Dispense Refill  . albuterol (VENTOLIN HFA) 108 (90 Base) MCG/ACT inhaler Inhale 2 puffs into the lungs every 6 (six) hours as needed for wheezing or shortness of breath. 18 g  1  . atorvastatin (LIPITOR) 40 MG tablet Take 1 tablet (40 mg total) by mouth daily. 30 tablet 6  . Blood Glucose Monitoring Suppl (CONTOUR NEXT MONITOR) w/Device KIT 1 each by Does not apply route 3 (three) times daily before meals. 1 kit 0  . CONTOUR NEXT TEST test strip USE AS DIRECTED THREE TIMES DAILY BEFORE MEALS 100 strip 12  . fluconazole (DIFLUCAN) 150 MG tablet Take 1 tablet (150 mg total) by mouth daily. 2 tablet 0  . glipiZIDE (GLUCOTROL) 10 MG tablet Take 2 tablets (20 mg total) by mouth 2 (two) times daily before a meal. 120 tablet 6  . ibuprofen (ADVIL,MOTRIN) 800 MG tablet Take 1 tablet (800 mg total) by mouth 3 (three) times daily. (Patient not taking: Reported on 06/24/2019) 21 tablet 0  . Insulin Pen Needle (BD PEN NEEDLE NANO U/F) 32G X 4 MM MISC USE AS DIRECTED TO  INJECT  VICTOZA 100 each 4  . liraglutide (VICTOZA) 18 MG/3ML SOPN Inject  0.3 mLs (1.8 mg total) into the skin daily with breakfast. 3 pen 6  . lisinopril-hydrochlorothiazide (ZESTORETIC) 20-12.5 MG tablet Take 2 tablets by mouth daily. 30 tablet 6  . loperamide (IMODIUM A-D) 2 MG capsule 2 mg Q 6 hrs PRN 12 capsule 0  . meloxicam (MOBIC) 15 MG tablet Take 15 mg by mouth daily.    . Microlet Lancets MISC Use to check blood sugar up to 3 times daily. 100 each 11  . neomycin-polymyxin-hydrocortisone (CORTISPORIN) OTIC solution Apply 2-3 drops to the ingrown toenail site twice daily. Cover with band-aid. 10 mL 0  . nystatin-triamcinolone (MYCOLOG II) cream Apply to affected area daily 15 g 0  . ondansetron (ZOFRAN ODT) 4 MG disintegrating tablet Take 1 tablet (4 mg total) by mouth every 8 (eight) hours as needed for nausea or vomiting. 20 tablet 0   No facility-administered medications prior to visit.    No Known Allergies  ROS Review of Systems  Constitutional: Negative for chills, fatigue and fever.  HENT: Negative.   Eyes: Negative.   Respiratory: Negative.   Cardiovascular: Negative.   Gastrointestinal: Negative.   Endocrine: Negative.   Genitourinary: Negative.   Musculoskeletal: Negative.   Skin: Positive for wound.  Allergic/Immunologic: Negative.   Neurological: Negative.   Hematological: Negative.   Psychiatric/Behavioral: Negative.       Objective:    Physical Exam Vitals and nursing note reviewed.  Constitutional:      General: She is not in acute distress.    Appearance: Normal appearance. She is not ill-appearing.  HENT:     Head: Normocephalic and atraumatic.     Right Ear: External ear normal.     Left Ear: External ear normal.     Nose: Nose normal.     Mouth/Throat:     Mouth: Mucous membranes are moist.     Pharynx: Oropharynx is clear.  Eyes:     Extraocular Movements: Extraocular movements intact.     Conjunctiva/sclera: Conjunctivae normal.     Pupils: Pupils are equal, round, and reactive to light.  Cardiovascular:      Rate and Rhythm: Normal rate and regular rhythm.     Pulses: Normal pulses.          Dorsalis pedis pulses are 2+ on the left side.     Heart sounds: Normal heart sounds.  Pulmonary:     Effort: Pulmonary effort is normal.     Breath sounds: Normal breath sounds.  Abdominal:  General: Abdomen is flat.     Palpations: Abdomen is soft.  Musculoskeletal:        General: Normal range of motion.     Cervical back: Normal range of motion and neck supple.  Feet:     Left foot:     Skin integrity: Erythema present.     Toenail Condition: Left toenails are abnormally thick. Fungal disease present. Skin:    General: Skin is warm and dry.  Neurological:     General: No focal deficit present.     Mental Status: She is alert and oriented to person, place, and time.  Psychiatric:        Mood and Affect: Mood normal.        Behavior: Behavior normal.        Thought Content: Thought content normal.        Judgment: Judgment normal.     Left great toe:    BP 136/87 (BP Location: Left Arm, Patient Position: Sitting, Cuff Size: Large)   Pulse 98   Temp 98.2 F (36.8 C) (Oral)   Resp 18   Ht $R'5\' 6"'uY$  (1.676 m)   SpO2 100%   BMI 37.16 kg/m  Wt Readings from Last 3 Encounters:  04/26/20 (!) 230 lb 3.2 oz (104.4 kg)  12/25/19 235 lb (106.6 kg)  12/24/19 235 lb 3.2 oz (106.7 kg)     Health Maintenance Due  Topic Date Due  . Hepatitis C Screening  Never done  . COVID-19 Vaccine (1) Never done  . FOOT EXAM  05/11/2018  . OPHTHALMOLOGY EXAM  05/30/2019  . INFLUENZA VACCINE  05/02/2020    There are no preventive care reminders to display for this patient.  Lab Results  Component Value Date   TSH 0.883 01/19/2014   Lab Results  Component Value Date   WBC 13.4 (H) 12/24/2019   HGB 10.4 (L) 12/24/2019   HCT 31.7 (L) 12/24/2019   MCV 77 (L) 12/24/2019   PLT 297 12/24/2019   Lab Results  Component Value Date   NA 141 12/24/2019   K 3.8 12/24/2019   CO2 23 12/24/2019    GLUCOSE 97 12/24/2019   BUN 18 12/24/2019   CREATININE 0.73 12/24/2019   BILITOT <0.2 12/24/2019   ALKPHOS 114 12/24/2019   AST 13 12/24/2019   ALT 12 12/24/2019   PROT 6.5 12/24/2019   ALBUMIN 3.7 (L) 12/24/2019   CALCIUM 9.2 12/24/2019   ANIONGAP 7 12/08/2017   Lab Results  Component Value Date   CHOL 134 12/24/2019   Lab Results  Component Value Date   HDL 43 12/24/2019   Lab Results  Component Value Date   LDLCALC 72 12/24/2019   Lab Results  Component Value Date   TRIG 102 12/24/2019   Lab Results  Component Value Date   CHOLHDL 3.1 12/24/2019   Lab Results  Component Value Date   HGBA1C 7.1 (A) 04/26/2020      Assessment & Plan:   Problem List Items Addressed This Visit      Endocrine   Diabetes type 2, uncontrolled ([place]) (Chronic)   Relevant Orders   Glucose (CBG) (Completed)    Other Visit Diagnoses    Cellulitis of great toe of left foot    -  Primary   Relevant Medications   mupirocin cream (BACTROBAN) 2 %    1. Cellulitis of great toe of left foot Patient education given, keep clean and dry  - mupirocin cream (BACTROBAN) 2 %; Apply  1 application topically 2 (two) times daily.  Dispense: 15 g; Refill: 0  2. Uncontrolled type 2 diabetes mellitus with hypoglycemia, unspecified hypoglycemia coma status (HCC) A1C 8.0, patient has upcoming appt with PCP, education given on improving dietary choices, patient is currently taking a nutrition class, trial Vit D 2,000 unit OTC - Glucose (CBG)   I have reviewed the patient's medical history (PMH, PSH, Social History, Family History, Medications, and allergies) , and have been updated if relevant. I spent 20 minutes reviewing chart and  face to face time with patient.     Meds ordered this encounter  Medications  . mupirocin cream (BACTROBAN) 2 %    Sig: Apply 1 application topically 2 (two) times daily.    Dispense:  15 g    Refill:  0    Order Specific Question:   Supervising Provider     Answer:   Elsie Stain [[id number]]    Follow-up: Return if symptoms worsen or fail to improve.    Loraine Grip Mayers, PA-C

## 2020-07-19 NOTE — Patient Instructions (Signed)
Use the antibiotic cream and keep toe clean and dry.  I encourage you to take Vit D 2,000 units once daily and work on reducing carbohydrates in your breakfast and lunch.  Please let us know if there is anything else we can do for you  Kennieth Rad, PA-C Physician Assistant [facility] [email or web address]   Mediterranean Diet A Mediterranean diet refers to food and lifestyle choices that are based on the traditions of countries located on the [place]. This way of eating has been shown to help prevent certain conditions and improve outcomes for people who have chronic diseases, like kidney disease and heart disease. What are tips for following this plan? Lifestyle  Cook and eat meals together with your family, when possible.  Drink enough fluid to keep your urine clear or pale yellow.  Be physically active every day. This includes: ? Aerobic exercise like running or swimming. ? Leisure activities like gardening, walking, or housework.  Get 7-8 hours of sleep each night.  If recommended by your health care provider, drink red wine in moderation. This means 1 glass a day for nonpregnant women and 2 glasses a day for men. A glass of wine equals 5 oz (150 mL). Reading food labels   Check the serving size of packaged foods. For foods such as rice and pasta, the serving size refers to the amount of cooked product, not dry.  Check the total fat in packaged foods. Avoid foods that have saturated fat or trans fats.  Check the ingredients list for added sugars, such as corn syrup. Shopping  At the grocery store, buy most of your food from the areas near the walls of the store. This includes: ? Fresh fruits and vegetables (produce). ? Grains, beans, nuts, and seeds. Some of these may be available in unpackaged forms or large amounts (in bulk). ? Fresh seafood. ? Poultry and eggs. ? Low-fat dairy products.  Buy whole  ingredients instead of prepackaged foods.  Buy fresh fruits and vegetables in-season from local farmers markets.  Buy frozen fruits and vegetables in resealable bags.  If you do not have access to quality fresh seafood, buy precooked frozen shrimp or canned fish, such as tuna, salmon, or sardines.  Buy small amounts of raw or cooked vegetables, salads, or olives from the deli or salad bar at your store.  Stock your pantry so you always have certain foods on hand, such as olive oil, canned tuna, canned tomatoes, rice, pasta, and beans. Cooking  Cook foods with extra-virgin olive oil instead of using butter or other vegetable oils.  Have meat as a side dish, and have vegetables or grains as your main dish. This means having meat in small portions or adding small amounts of meat to foods like pasta or stew.  Use beans or vegetables instead of meat in common dishes like chili or lasagna.  Experiment with different cooking methods. Try roasting or broiling vegetables instead of steaming or sauteing them.  Add frozen vegetables to soups, stews, pasta, or rice.  Add nuts or seeds for added healthy fat at each meal. You can add these to yogurt, salads, or vegetable dishes.  Marinate fish or vegetables using olive oil, lemon juice, garlic, and fresh herbs. Meal planning   Plan to eat 1 vegetarian meal one day each week. Try to work up to 2 vegetarian meals, if possible.  Eat seafood 2 or more times a week.  Have healthy snacks readily available, such as: ?  Vegetable sticks with hummus. ? [place] yogurt. ? Fruit and nut trail mix.  Eat balanced meals throughout the week. This includes: ? Fruit: 2-3 servings a day ? Vegetables: 4-5 servings a day ? Low-fat dairy: 2 servings a day ? Fish, poultry, or lean meat: 1 serving a day ? Beans and legumes: 2 or more servings a week ? Nuts and seeds: 1-2 servings a day ? Whole grains: 6-8 servings a day ? Extra-virgin olive oil: 3-4 servings a  day  Limit red meat and sweets to only a few servings a month What are my food choices?  Mediterranean diet ? Recommended  Grains: Whole-grain pasta. Brown rice. Bulgar wheat. Polenta. Couscous. Whole-wheat bread. [place].  Vegetables: Artichokes. Beets. Broccoli. Cabbage. Carrots. Eggplant. Green beans. Chard. Kale. Spinach. Onions. Leeks. Peas. Squash. Tomatoes. Peppers. Radishes.  Fruits: Apples. Apricots. Avocado. Berries. Bananas. Cherries. Dates. Figs. Grapes. Lemons. Melon. Oranges. Peaches. Plums. Pomegranate.  Meats and other protein foods: Beans. Almonds. Sunflower seeds. Pine nuts. Peanuts. [place]. Salmon. Scallops. Shrimp. [place]. Tilapia. Clams. Oysters. Eggs.  Dairy: Low-fat milk. Cheese. Greek yogurt.  Beverages: Water. Red wine. Herbal tea.  Fats and oils: Extra virgin olive oil. Avocado oil. Grape seed oil.  Sweets and desserts: [place] yogurt with honey. Baked apples. Poached pears. Trail mix.  Seasoning and other foods: Basil. Cilantro. Coriander. Cumin. Mint. Parsley. Sage. Rosemary. Tarragon. Garlic. Oregano. Thyme. Pepper. Balsalmic vinegar. Tahini. Hummus. Tomato sauce. Olives. Mushrooms. ? Limit these  Grains: Prepackaged pasta or rice dishes. Prepackaged cereal with added sugar.  Vegetables: Deep fried potatoes (french fries).  Fruits: Fruit canned in syrup.  Meats and other protein foods: Beef. Pork. Lamb. Poultry with skin. Hot dogs. [place] Salines.  Dairy: Ice cream. Sour cream. Whole milk.  Beverages: Juice. Sugar-sweetened soft drinks. Beer. Liquor and spirits.  Fats and oils: Butter. Canola oil. Vegetable oil. Beef fat (tallow). Lard.  Sweets and desserts: Cookies. Cakes. Pies. Candy.  Seasoning and other foods: Mayonnaise. Premade sauces and marinades. The items listed may not be a complete list. Talk with your dietitian about what dietary choices are right for you. Summary  The Mediterranean diet includes both food and lifestyle choices.  Eat  a variety of fresh fruits and vegetables, beans, nuts, seeds, and whole grains.  Limit the amount of red meat and sweets that you eat.  Talk with your health care provider about whether it is safe for you to drink red wine in moderation. This means 1 glass a day for nonpregnant women and 2 glasses a day for men. A glass of wine equals 5 oz (150 mL). This information is not intended to replace advice given to you by your health care provider. Make sure you discuss any questions you have with your health care provider. Document Revised: 05/18/2016 Document Reviewed: 05/11/2016 Elsevier Patient Education  [place].

## 2020-08-28 ENCOUNTER — Other Ambulatory Visit: Payer: Self-pay | Admitting: Family Medicine

## 2020-08-28 DIAGNOSIS — I10 Essential (primary) hypertension: Secondary | ICD-10-CM

## 2020-08-28 NOTE — Telephone Encounter (Signed)
Requested Prescriptions  Pending Prescriptions Disp Refills   lisinopril-hydrochlorothiazide (ZESTORETIC) 20-12.5 MG tablet [Pharmacy Med Name: Lisinopril-hydroCHLOROthiazide 20-12.5 MG Oral Tablet] 60 tablet 0    Sig: Take 2 tablets by mouth once daily     Cardiovascular:  ACEI + Diuretic Combos Failed - 08/28/2020 10:02 AM      Failed - Na in normal range and within 180 days    Sodium  Date Value Ref Range Status  12/24/2019 141 134 - 144 mmol/L Final         Failed - K in normal range and within 180 days    Potassium  Date Value Ref Range Status  12/24/2019 3.8 3.5 - 5.2 mmol/L Final         Failed - Cr in normal range and within 180 days    Creat  Date Value Ref Range Status  11/01/2016 0.63 0.50 - 1.10 mg/dL Final   Creatinine, Ser  Date Value Ref Range Status  12/24/2019 0.73 0.57 - 1.00 mg/dL Final   Creatinine, Urine  Date Value Ref Range Status  11/26/2014 107.7 mg/dL Final    Comment:    No reference range established.         Failed - Ca in normal range and within 180 days    Calcium  Date Value Ref Range Status  12/24/2019 9.2 8.7 - 10.2 mg/dL Final   Calcium, Ion  Date Value Ref Range Status  03/10/2010 1.16 1.12 - 1.32 mmol/L Final         Passed - Patient is not pregnant      Passed - Last BP in normal range    BP Readings from Last 1 Encounters:  07/19/20 136/87         Passed - Valid encounter within last 6 months    Recent Outpatient Visits          4 months ago Type 2 diabetes mellitus with other specified complication, without long-term current use of insulin ([place])   [place], [place], MD   8 months ago Type 2 diabetes mellitus with other specified complication, without long-term current use of insulin [facility])   [place], Levada Dy M, [place]   9 months ago Nausea and vomiting, intractability of vomiting not specified, unspecified vomiting type   [place], [place], [place]   1 year ago Screening for breast cancer   Regino Ramirez, [place], [place]   1 year ago Type 2 diabetes mellitus with other specified complication, without long-term current use of insulin ([place])   [place], MD      Future Appointments            In 1 week Charlott Rakes, MD [place]

## 2020-08-30 ENCOUNTER — Ambulatory Visit: Payer: BC Managed Care – PPO | Admitting: Family Medicine

## 2020-09-06 ENCOUNTER — Encounter: Payer: Self-pay | Admitting: Family Medicine

## 2020-09-06 ENCOUNTER — Other Ambulatory Visit: Payer: Self-pay

## 2020-09-06 ENCOUNTER — Other Ambulatory Visit: Payer: Self-pay | Admitting: Pharmacist

## 2020-09-06 ENCOUNTER — Ambulatory Visit: Payer: BC Managed Care – PPO | Attending: Family Medicine | Admitting: Family Medicine

## 2020-09-06 VITALS — BP 122/78 | HR 96 | Ht 66.0 in | Wt 236.6 lb

## 2020-09-06 DIAGNOSIS — Z1159 Encounter for screening for other viral diseases: Secondary | ICD-10-CM

## 2020-09-06 DIAGNOSIS — I152 Hypertension secondary to endocrine disorders: Secondary | ICD-10-CM

## 2020-09-06 DIAGNOSIS — E1149 Type 2 diabetes mellitus with other diabetic neurological complication: Secondary | ICD-10-CM

## 2020-09-06 DIAGNOSIS — E785 Hyperlipidemia, unspecified: Secondary | ICD-10-CM

## 2020-09-06 DIAGNOSIS — L84 Corns and callosities: Secondary | ICD-10-CM

## 2020-09-06 DIAGNOSIS — E1169 Type 2 diabetes mellitus with other specified complication: Secondary | ICD-10-CM

## 2020-09-06 DIAGNOSIS — E1159 Type 2 diabetes mellitus with other circulatory complications: Secondary | ICD-10-CM | POA: Diagnosis not present

## 2020-09-06 LAB — GLUCOSE, POCT (MANUAL RESULT ENTRY): POC Glucose: 158 mg/dl — AB (ref 70–99)

## 2020-09-06 LAB — POCT GLYCOSYLATED HEMOGLOBIN (HGB A1C): HbA1c, POC (controlled diabetic range): 8.6 % — AB (ref 0.0–7.0)

## 2020-09-06 MED ORDER — CONTOUR NEXT MONITOR W/DEVICE KIT: PACK | 0 refills | Status: AC

## 2020-09-06 MED ORDER — DAPAGLIFLOZIN PROPANEDIOL 5 MG PO TABS: 5.0000 mg | ORAL_TABLET | Freq: Every day | ORAL | 6 refills | Status: DC

## 2020-09-06 MED ORDER — GLIPIZIDE 10 MG PO TABS: 20.0000 mg | ORAL_TABLET | Freq: Two times a day (BID) | ORAL | 6 refills | Status: DC

## 2020-09-06 MED ORDER — VICTOZA 18 MG/3ML ~~LOC~~ SOPN: 1.8000 mg | PEN_INJECTOR | Freq: Every day | SUBCUTANEOUS | 6 refills | Status: DC

## 2020-09-06 MED ORDER — GABAPENTIN 300 MG PO CAPS: 300.0000 mg | ORAL_CAPSULE | Freq: Every day | ORAL | 6 refills | Status: DC

## 2020-09-06 MED ORDER — ATORVASTATIN CALCIUM 40 MG PO TABS: 40.0000 mg | ORAL_TABLET | Freq: Every day | ORAL | 6 refills | Status: DC

## 2020-09-06 MED ORDER — MICROLET LANCETS MISC: 6 refills | Status: AC

## 2020-09-06 MED ORDER — CONTOUR NEXT TEST VI STRP: ORAL_STRIP | 6 refills | Status: AC

## 2020-09-06 MED ORDER — LISINOPRIL-HYDROCHLOROTHIAZIDE 20-12.5 MG PO TABS: 2.0000 | ORAL_TABLET | Freq: Every day | ORAL | 6 refills | Status: DC

## 2020-09-06 NOTE — Progress Notes (Signed)
Subjective:  Patient ID: Sheila Bishop, female    DOB: [birth year]  Age: 49 y.o. MRN: [id number]  CC: Diabetes   HPI Sheila Bishop is a 49 year old female with history of type 2 diabetes mellitus (A1c 8.6), hypertension, hyperlipidemia who presents today for a follow-up visit. A1c is 8.6 up from 7.1 previously and she endorses compliance with her medications but has not been exercising as much.  Denies presence of hypoglycemia, visual concerns. She has had pain in both feet with associated pian in her heels and burning in her feet. She got insoles for her shoes all to no avail.  Previously seen by podiatry and had an ingrown toenail clipped but she does have a callus on her left foot which was not addressed and she has been applying a Band-Aid over it to cushion it.  She was out of her antihypertensive but recently got it filled.  During that timeframe she did have headaches. She has no additional concerns today. Past Medical History:  Diagnosis Date  . Breast abscess Nov 2012   left  . Depression 2007  . Diabetes mellitus 2009  . Headache(784.0)   . Hypertension 2009  . Obesity   . Wears glasses     Past Surgical History:  Procedure Laterality Date  . BREAST BIOPSY  08/07/2011   Procedure: BREAST BIOPSY;  Surgeon: Joyice Faster. Cornett, MD;  Location: [place];  Service: General;  Laterality: Left;  BIOPSY BREAST LEFT SIDE  . CESAREAN SECTION  2002    Family History  Problem Relation Age of Onset  . Hyperlipidemia Mother   . Hypertension Mother   . Heart disease Mother     No Known Allergies  Outpatient Medications Prior to Visit  Medication Sig Dispense Refill  . albuterol (VENTOLIN HFA) 108 (90 Base) MCG/ACT inhaler Inhale 2 puffs into the lungs every 6 (six) hours as needed for wheezing or shortness of breath. 18 g 1  . fluconazole (DIFLUCAN) 150 MG tablet Take 1 tablet (150 mg total) by mouth daily. 2 tablet 0  . Insulin Pen Needle (BD PEN NEEDLE NANO U/F) 32G X  4 MM MISC USE AS DIRECTED TO  INJECT  VICTOZA 100 each 4  . loperamide (IMODIUM A-D) 2 MG capsule 2 mg Q 6 hrs PRN 12 capsule 0  . meloxicam (MOBIC) 15 MG tablet Take 15 mg by mouth daily.    . Microlet Lancets MISC Use to check blood sugar up to 3 times daily. 100 each 11  . mupirocin cream (BACTROBAN) 2 % Apply 1 application topically 2 (two) times daily. 15 g 0  . neomycin-polymyxin-hydrocortisone (CORTISPORIN) OTIC solution Apply 2-3 drops to the ingrown toenail site twice daily. Cover with band-aid. 10 mL 0  . nystatin-triamcinolone (MYCOLOG II) cream Apply to affected area daily 15 g 0  . ondansetron (ZOFRAN ODT) 4 MG disintegrating tablet Take 1 tablet (4 mg total) by mouth every 8 (eight) hours as needed for nausea or vomiting. 20 tablet 0  . atorvastatin (LIPITOR) 40 MG tablet Take 1 tablet (40 mg total) by mouth daily. 30 tablet 6  . Blood Glucose Monitoring Suppl (CONTOUR NEXT MONITOR) w/Device KIT 1 each by Does not apply route 3 (three) times daily before meals. 1 kit 0  . CONTOUR NEXT TEST test strip USE AS DIRECTED THREE TIMES DAILY BEFORE MEALS 100 strip 12  . glipiZIDE (GLUCOTROL) 10 MG tablet Take 2 tablets (20 mg total) by mouth 2 (two) times daily before a meal.  120 tablet 6  . liraglutide (VICTOZA) 18 MG/3ML SOPN Inject 0.3 mLs (1.8 mg total) into the skin daily with breakfast. 3 pen 6  . lisinopril-hydrochlorothiazide (ZESTORETIC) 20-12.5 MG tablet Take 2 tablets by mouth once daily 60 tablet 0  . ibuprofen (ADVIL,MOTRIN) 800 MG tablet Take 1 tablet (800 mg total) by mouth 3 (three) times daily. (Patient not taking: Reported on 06/24/2019) 21 tablet 0   No facility-administered medications prior to visit.     ROS Review of Systems  Constitutional: Negative for activity change, appetite change and fatigue.  HENT: Negative for congestion, sinus pressure and sore throat.   Eyes: Negative for visual disturbance.  Respiratory: Negative for cough, chest tightness, shortness of  breath and wheezing.   Cardiovascular: Negative for chest pain and palpitations.  Gastrointestinal: Negative for abdominal distention, abdominal pain and constipation.  Endocrine: Negative for polydipsia.  Genitourinary: Negative for dysuria and frequency.  Musculoskeletal: Negative for arthralgias and back pain.  Skin: Negative for rash.  Neurological: Positive for numbness. Negative for tremors and light-headedness.  Hematological: Does not bruise/bleed easily.  Psychiatric/Behavioral: Negative for agitation and behavioral problems.    Objective:  BP 122/78   Pulse 96   Ht _0  (1.676 m)   Wt 236 lb 9.6 oz (107.3 kg)   SpO2 99%   BMI 38.19 kg/m   BP/Weight 09/06/2020 07/19/2020 5/85/2778  Systolic BP 242 353 614  Diastolic BP 78 87 95  Wt. (Lbs) 236.6 - -  BMI 38.19 37.16 -    Wt Readings from Last 3 Encounters:  09/06/20 236 lb 9.6 oz (107.3 kg)  04/26/20 (!) 230 lb 3.2 oz (104.4 kg)  12/25/19 235 lb (106.6 kg)      Physical Exam Constitutional:      Appearance: She is well-developed.  Neck:     Vascular: No JVD.  Cardiovascular:     Rate and Rhythm: Normal rate.     Heart sounds: Normal heart sounds. No murmur heard.   Pulmonary:     Effort: Pulmonary effort is normal.     Breath sounds: Normal breath sounds. No wheezing or rales.  Chest:     Chest wall: No tenderness.  Abdominal:     General: Bowel sounds are normal. There is no distension.     Palpations: Abdomen is soft. There is no mass.     Tenderness: There is no abdominal tenderness.  Musculoskeletal:        General: Normal range of motion.     Right lower leg: No edema.     Left lower leg: No edema.  Neurological:     Mental Status: She is alert and oriented to person, place, and time.  Psychiatric:        Mood and Affect: Mood normal.    Diabetic Foot Exam - Simple   Simple Foot Form Visual Inspection See comments: Yes Sensation Testing Intact to touch and monofilament testing  bilaterally: Yes Pulse Check Posterior Tibialis and Dorsalis pulse intact bilaterally: Yes Comments Callus on anterior sole of left foot.  No ulcerations or skin breakdown.  Onychomycosis of left big toenail.      CMP Latest Ref Rng & Units 12/24/2019 05/19/2019 02/12/2019  Glucose 65 - 99 mg/dL 97 98 73  BUN 6 - 24 mg/dL 18 26(H) 22  Creatinine 0.57 - 1.00 mg/dL 0.73 0.92 0.73  Sodium 134 - 144 mmol/L 141 143 139  Potassium 3.5 - 5.2 mmol/L 3.8 4.2 3.9  Chloride 96 - 106 mmol/L  104 104 101  CO2 20 - 29 mmol/L _0 Calcium 8.7 - 10.2 mg/dL 9.2 9.6 9.2  Total Protein 6.0 - 8.5 g/dL 6.5 - 6.7  Total Bilirubin 0.0 - 1.2 mg/dL <0.2 - 0.3  Alkaline Phos 39 - 117 IU/L 114 - 105  AST 0 - 40 IU/L 13 - 12  ALT 0 - 32 IU/L 12 - 13    Lipid Panel     Component Value Date/Time   CHOL 134 12/24/2019 1618   TRIG 102 12/24/2019 1618   HDL 43 12/24/2019 1618   CHOLHDL 3.1 12/24/2019 1618   CHOLHDL 3.9 11/01/2016 1608   VLDL 18 06/16/2010 2054   LDLCALC 72 12/24/2019 1618   LDLDIRECT 131 (H) 04/21/2010 2016    CBC    Component Value Date/Time   WBC 13.4 (H) 12/24/2019 1618   WBC 14.6 (H) 12/08/2017 2135   RBC 4.11 12/24/2019 1618   RBC 3.91 12/08/2017 2135   HGB 10.4 (L) 12/24/2019 1618   HCT 31.7 (L) 12/24/2019 1618   PLT 297 12/24/2019 1618   MCV 77 (L) 12/24/2019 1618   MCH 25.3 (L) 12/24/2019 1618   MCH 26.1 12/08/2017 2135   MCHC 32.8 12/24/2019 1618   MCHC 33.1 12/08/2017 2135   RDW 14.3 12/24/2019 1618   LYMPHSABS 3.2 (H) 12/24/2019 1618   MONOABS 610 02/23/2016 1052   EOSABS 0.4 12/24/2019 1618   BASOSABS 0.1 12/24/2019 1618    Lab Results  Component Value Date   HGBA1C 8.6 (A) 09/06/2020    Assessment & Plan:  1. Type 2 diabetes mellitus with other neurologic complication, without long-term current use of insulin (HCC) Uncontrolled with A1c of 8.6 which has trended up from 7.1 previously Goal is less than 7.0 Farxiga added to regimen Gabapentin added  for neuropathy Counseled on Diabetic diet, my plate method, 825 minutes of moderate intensity exercise/week Blood sugar logs with fasting goals of 80-120 mg/dl, random of less than 180 and in the event of sugars less than 60 mg/dl or greater than 400 mg/dl encouraged to notify the clinic. Advised on the need for annual eye exams, annual foot exams, Pneumonia vaccine. - POCT glucose (manual entry) - POCT glycosylated hemoglobin (Hb K5L) - Basic Metabolic Panel - gabapentin (NEURONTIN) 300 MG capsule; Take 1 capsule (300 mg total) by mouth at bedtime.  Dispense: 30 capsule; Refill: 6 - dapagliflozin propanediol (FARXIGA) 5 MG TABS tablet; Take 1 tablet (5 mg total) by mouth daily before breakfast.  Dispense: 30 tablet; Refill: 6 - liraglutide (VICTOZA) 18 MG/3ML SOPN; Inject 1.8 mg into the skin daily with breakfast.  Dispense: 9 mL; Refill: 6 - glipiZIDE (GLUCOTROL) 10 MG tablet; Take 2 tablets (20 mg total) by mouth 2 (two) times daily before a meal.  Dispense: 120 tablet; Refill: 6  2. Need for hepatitis C screening test - HCV RNA quant rflx ultra or genotyp([facility])  3. Hypertension associated with diabetes ([place]) Controlled Counseled on blood pressure goal of less than 130/80, low-sodium, DASH diet, medication compliance, 150 minutes of moderate intensity exercise per week. Discussed medication compliance, adverse effects. - lisinopril-hydrochlorothiazide (ZESTORETIC) 20-12.5 MG tablet; Take 2 tablets by mouth daily.  Dispense: 60 tablet; Refill: 6  4. Dyslipidemia associated with type 2 diabetes mellitus (HCC) Controlled - atorvastatin (LIPITOR) 40 MG tablet; Take 1 tablet (40 mg total) by mouth daily.  Dispense: 30 tablet; Refill: 6  5. Callus of foot She would need to see a Podiatrist for foot care Due  to her work schedule she has been unable to schedule this and has been advised to do so   Meds ordered this encounter  Medications  . gabapentin (NEURONTIN) 300 MG capsule     Sig: Take 1 capsule (300 mg total) by mouth at bedtime.    Dispense:  30 capsule    Refill:  6  . dapagliflozin propanediol (FARXIGA) 5 MG TABS tablet    Sig: Take 1 tablet (5 mg total) by mouth daily before breakfast.    Dispense:  30 tablet    Refill:  6  . lisinopril-hydrochlorothiazide (ZESTORETIC) 20-12.5 MG tablet    Sig: Take 2 tablets by mouth daily.    Dispense:  60 tablet    Refill:  6  . liraglutide (VICTOZA) 18 MG/3ML SOPN    Sig: Inject 1.8 mg into the skin daily with breakfast.    Dispense:  9 mL    Refill:  6  . glipiZIDE (GLUCOTROL) 10 MG tablet    Sig: Take 2 tablets (20 mg total) by mouth 2 (two) times daily before a meal.    Dispense:  120 tablet    Refill:  6    Discontinue previous dose  . atorvastatin (LIPITOR) 40 MG tablet    Sig: Take 1 tablet (40 mg total) by mouth daily.    Dispense:  30 tablet    Refill:  6    Follow-up: Return in about 3 months (around 12/05/2020) for Chronic disease management.       Charlott Rakes, MD, FAAFP. [facility] [place], [place]   09/06/2020, 3:55 PM

## 2020-09-06 NOTE — Progress Notes (Signed)
Has been having headaches. Pain in both feet

## 2020-09-06 NOTE — Patient Instructions (Signed)
Diabetes Mellitus and Foot Care Foot care is an important part of your health, especially when you have diabetes. Diabetes may cause you to have problems because of poor blood flow (circulation) to your feet and legs, which can cause your skin to:  Become thinner and drier.  Break more easily.  Heal more slowly.  Peel and crack. You may also have nerve damage (neuropathy) in your legs and feet, causing decreased feeling in them. This means that you may not notice minor injuries to your feet that could lead to more serious problems. Noticing and addressing any potential problems early is the best way to prevent future foot problems. How to care for your feet Foot hygiene  Wash your feet daily with warm water and mild soap. Do not use hot water. Then, pat your feet and the areas between your toes until they are completely dry. Do not soak your feet as this can dry your skin.  Trim your toenails straight across. Do not dig under them or around the cuticle. File the edges of your nails with an emery board or nail file.  Apply a moisturizing lotion or petroleum jelly to the skin on your feet and to dry, brittle toenails. Use lotion that does not contain alcohol and is unscented. Do not apply lotion between your toes. Shoes and socks  Wear clean socks or stockings every day. Make sure they are not too tight. Do not wear knee-high stockings since they may decrease blood flow to your legs.  Wear shoes that fit properly and have enough cushioning. Always look in your shoes before you put them on to be sure there are no objects inside.  To break in new shoes, wear them for just a few hours a day. This prevents injuries on your feet. Wounds, scrapes, corns, and calluses  Check your feet daily for blisters, cuts, bruises, sores, and redness. If you cannot see the bottom of your feet, use a mirror or ask someone for help.  Do not cut corns or calluses or try to remove them with medicine.  If you  find a minor scrape, cut, or break in the skin on your feet, keep it and the skin around it clean and dry. You may clean these areas with mild soap and water. Do not clean the area with peroxide, alcohol, or iodine.  If you have a wound, scrape, corn, or callus on your foot, look at it several times a day to make sure it is healing and not infected. Check for: ? Redness, swelling, or pain. ? Fluid or blood. ? Warmth. ? Pus or a bad smell. General instructions  Do not cross your legs. This may decrease blood flow to your feet.  Do not use heating pads or hot water bottles on your feet. They may burn your skin. If you have lost feeling in your feet or legs, you may not know this is happening until it is too late.  Protect your feet from hot and cold by wearing shoes, such as at the beach or on hot pavement.  Schedule a complete foot exam at least once a year (annually) or more often if you have foot problems. If you have foot problems, report any cuts, sores, or bruises to your health care provider immediately. Contact a health care provider if:  You have a medical condition that increases your risk of infection and you have any cuts, sores, or bruises on your feet.  You have an injury that is not   healing.  You have redness on your legs or feet.  You feel burning or tingling in your legs or feet.  You have pain or cramps in your legs and feet.  Your legs or feet are numb.  Your feet always feel cold.  You have pain around a toenail. Get help right away if:  You have a wound, scrape, corn, or callus on your foot and: ? You have pain, swelling, or redness that gets worse. ? You have fluid or blood coming from the wound, scrape, corn, or callus. ? Your wound, scrape, corn, or callus feels warm to the touch. ? You have pus or a bad smell coming from the wound, scrape, corn, or callus. ? You have a fever. ? You have a red line going up your leg. Summary  Check your feet every day  for cuts, sores, red spots, swelling, and blisters.  Moisturize feet and legs daily.  Wear shoes that fit properly and have enough cushioning.  If you have foot problems, report any cuts, sores, or bruises to your health care provider immediately.  Schedule a complete foot exam at least once a year (annually) or more often if you have foot problems. This information is not intended to replace advice given to you by your health care provider. Make sure you discuss any questions you have with your health care provider. Document Revised: 06/11/2019 Document Reviewed: 10/20/2016 Elsevier Patient Education  2020 Elsevier Inc.  

## 2020-09-07 LAB — BASIC METABOLIC PANEL
BUN/Creatinine Ratio: 30 — ABNORMAL HIGH (ref 9–23)
BUN: 28 mg/dL — ABNORMAL HIGH (ref 6–24)
CO2: 25 mmol/L (ref 20–29)
Calcium: 9.7 mg/dL (ref 8.7–10.2)
Chloride: 101 mmol/L (ref 96–106)
Creatinine, Ser: 0.93 mg/dL (ref 0.57–1.00)
GFR calc Af Amer: 85 mL/min/{1.73_m2} (ref >59)
GFR calc non Af Amer: 73 mL/min/{1.73_m2} (ref >59)
Glucose: 119 mg/dL — ABNORMAL HIGH (ref 65–99)
Potassium: 3.8 mmol/L (ref 3.5–5.2)
Sodium: 141 mmol/L (ref 134–144)

## 2020-09-07 LAB — HCV RNA QUANT RFLX ULTRA OR GENOTYP: HCV Quant Baseline: NOT DETECTED IU/mL

## 2020-09-10 ENCOUNTER — Encounter: Payer: Self-pay | Admitting: *Deleted

## 2020-10-13 ENCOUNTER — Ambulatory Visit (HOSPITAL_COMMUNITY)
Admission: EM | Admit: 2020-10-13 | Discharge: 2020-10-13 | Disposition: A | Discharge status: Home / Self Care | Payer: BC Managed Care – PPO | Attending: Urgent Care | Admitting: Urgent Care

## 2020-10-13 ENCOUNTER — Other Ambulatory Visit: Payer: Self-pay

## 2020-10-13 ENCOUNTER — Encounter (HOSPITAL_COMMUNITY): Payer: Self-pay

## 2020-10-13 DIAGNOSIS — J42 Unspecified chronic bronchitis: Secondary | ICD-10-CM

## 2020-10-13 DIAGNOSIS — B349 Viral infection, unspecified: Secondary | ICD-10-CM | POA: Diagnosis not present

## 2020-10-13 DIAGNOSIS — U071 COVID-19: Secondary | ICD-10-CM | POA: Insufficient documentation

## 2020-10-13 DIAGNOSIS — E119 Type 2 diabetes mellitus without complications: Secondary | ICD-10-CM | POA: Diagnosis not present

## 2020-10-13 DIAGNOSIS — R059 Cough, unspecified: Secondary | ICD-10-CM

## 2020-10-13 LAB — SARS CORONAVIRUS 2 (TAT 6-24 HRS): SARS Coronavirus 2: POSITIVE — AB

## 2020-10-13 MED ORDER — PROMETHAZINE-DM 6.25-15 MG/5ML PO SYRP: 5.0000 mL | ORAL_SOLUTION | Freq: Every evening | ORAL | 0 refills | Status: DC | PRN

## 2020-10-13 MED ORDER — BENZONATATE 100 MG PO CAPS: 100.0000 mg | ORAL_CAPSULE | Freq: Three times a day (TID) | ORAL | 0 refills | Status: DC | PRN

## 2020-10-13 MED ORDER — CETIRIZINE HCL 10 MG PO TABS: 10.0000 mg | ORAL_TABLET | Freq: Every day | ORAL | 0 refills | Status: DC

## 2020-10-13 NOTE — Discharge Instructions (Addendum)

## 2020-10-13 NOTE — ED Triage Notes (Signed)
Pt presents with generalized body aches, sore throat, productive cough, chest tightness, and shortness of breath X 2 days.

## 2020-10-13 NOTE — ED Provider Notes (Addendum)
[place]   MRN: [id number] DOB: 16-Sep-1971  Subjective:   Sheila Bishop is a 50 y.o. female presenting for 2-day history of acute onset body aches, sore throat, runny and stuffy nose, productive cough that elicits chest tightness and shortness of breath.  Patient states she has had green mucus, blood from her nose.  She does have a history of diabetes.  She is vaccinated COVID-19.  She did have exposure at work with multiple employees that were positive for COVID-19.  No current facility-administered medications for this encounter.  Current Outpatient Medications:  .  albuterol (VENTOLIN HFA) 108 (90 Base) MCG/ACT inhaler, Inhale 2 puffs into the lungs every 6 (six) hours as needed for wheezing or shortness of breath., Disp: 18 g, Rfl: 1 .  atorvastatin (LIPITOR) 40 MG tablet, Take 1 tablet (40 mg total) by mouth daily., Disp: 30 tablet, Rfl: 6 .  Blood Glucose Monitoring Suppl (CONTOUR NEXT MONITOR) w/Device KIT, Use to check blood sugar twice daily. E11.69, Disp: 1 kit, Rfl: 0 .  dapagliflozin propanediol (FARXIGA) 5 MG TABS tablet, Take 1 tablet (5 mg total) by mouth daily before breakfast., Disp: 30 tablet, Rfl: 6 .  fluconazole (DIFLUCAN) 150 MG tablet, Take 1 tablet (150 mg total) by mouth daily., Disp: 2 tablet, Rfl: 0 .  gabapentin (NEURONTIN) 300 MG capsule, Take 1 capsule (300 mg total) by mouth at bedtime., Disp: 30 capsule, Rfl: 6 .  glipiZIDE (GLUCOTROL) 10 MG tablet, Take 2 tablets (20 mg total) by mouth 2 (two) times daily before a meal., Disp: 120 tablet, Rfl: 6 .  glucose blood (CONTOUR NEXT TEST) test strip, Use to check blood sugar twice daily. E11.69, Disp: 100 each, Rfl: 6 .  ibuprofen (ADVIL,MOTRIN) 800 MG tablet, Take 1 tablet (800 mg total) by mouth 3 (three) times daily. (Patient not taking: Reported on 06/24/2019), Disp: 21 tablet, Rfl: 0 .  Insulin Pen Needle (BD PEN NEEDLE NANO U/F) 32G X 4 MM MISC, USE AS DIRECTED TO  INJECT  VICTOZA, Disp:  100 each, Rfl: 4 .  liraglutide (VICTOZA) 18 MG/3ML SOPN, Inject 1.8 mg into the skin daily with breakfast., Disp: 9 mL, Rfl: 6 .  lisinopril-hydrochlorothiazide (ZESTORETIC) 20-12.5 MG tablet, Take 2 tablets by mouth daily., Disp: 60 tablet, Rfl: 6 .  loperamide (IMODIUM A-D) 2 MG capsule, 2 mg Q 6 hrs PRN, Disp: 12 capsule, Rfl: 0 .  meloxicam (MOBIC) 15 MG tablet, Take 15 mg by mouth daily., Disp: , Rfl:  .  Microlet Lancets MISC, Use to check blood sugar up to 3 times daily., Disp: 100 each, Rfl: 11 .  Microlet Lancets MISC, Use to check blood sugar twice daily. E11.69, Disp: 100 each, Rfl: 6 .  mupirocin cream (BACTROBAN) 2 %, Apply 1 application topically 2 (two) times daily., Disp: 15 g, Rfl: 0 .  neomycin-polymyxin-hydrocortisone (CORTISPORIN) OTIC solution, Apply 2-3 drops to the ingrown toenail site twice daily. Cover with band-aid., Disp: 10 mL, Rfl: 0 .  nystatin-triamcinolone (MYCOLOG II) cream, Apply to affected area daily, Disp: 15 g, Rfl: 0 .  ondansetron (ZOFRAN ODT) 4 MG disintegrating tablet, Take 1 tablet (4 mg total) by mouth every 8 (eight) hours as needed for nausea or vomiting., Disp: 20 tablet, Rfl: 0   No Known Allergies  Past Medical History:  Diagnosis Date  . Breast abscess Nov 2012   left  . Depression 2007  . Diabetes mellitus 2009  . Headache(784.0)   . Hypertension 2009  .  Obesity   . Wears glasses      Past Surgical History:  Procedure Laterality Date  . BREAST BIOPSY  08/07/2011   Procedure: BREAST BIOPSY;  Surgeon: Joyice Faster. Cornett, MD;  Location: [facility];  Service: General;  Laterality: Left;  BIOPSY BREAST LEFT SIDE  . CESAREAN SECTION  2002    Family History  Problem Relation Age of Onset  . Hyperlipidemia Mother   . Hypertension Mother   . Heart disease Mother     Social History   Tobacco Use  . Smoking status: Former Smoker    Quit date: 12/31/2007    Years since quitting: 12.7  . Smokeless tobacco: Never Used  Substance Use Topics   . Alcohol use: No  . Drug use: No    ROS   Objective:   Vitals: BP (!) 143/80 (BP Location: Left Arm)   Pulse (!) 102   Temp 98.6 F (37 C) (Oral)   Resp (!) 22   SpO2 95%   Physical Exam Constitutional:      General: She is not in acute distress.    Appearance: Normal appearance. She is well-developed. She is not ill-appearing, toxic-appearing or diaphoretic.  HENT:     Head: Normocephalic and atraumatic.     Nose: Nose normal.     Mouth/Throat:     Mouth: Mucous membranes are moist.  Eyes:     Extraocular Movements: Extraocular movements intact.     Pupils: Pupils are equal, round, and reactive to light.  Cardiovascular:     Rate and Rhythm: Normal rate and regular rhythm.     Pulses: Normal pulses.     Heart sounds: Normal heart sounds. No murmur heard. No friction rub. No gallop.   Pulmonary:     Effort: Pulmonary effort is normal. No respiratory distress.     Breath sounds: Normal breath sounds. No stridor. No wheezing, rhonchi or rales.  Skin:    General: Skin is warm and dry.     Findings: No rash.  Neurological:     Mental Status: She is alert and oriented to person, place, and time.  Psychiatric:        Mood and Affect: Mood normal.        Behavior: Behavior normal.        Thought Content: Thought content normal.      Assessment and Plan :   PDMP not reviewed this encounter.  1. Viral illness   2. Cough   3. Well controlled diabetes mellitus ([place])   4. Chronic bronchitis, unspecified chronic bronchitis type ([place])     Will manage for viral illness such as viral URI, viral syndrome, viral rhinitis, COVID-19. Counseled patient on nature of COVID-19 including modes of transmission, diagnostic testing, management and supportive care.  Offered scripts for symptomatic relief. COVID 19 testing is pending. Counseled patient on potential for adverse effects with medications prescribed/recommended today, ER and return-to-clinic precautions discussed,  patient verbalized understanding.    Jaynee Eagles, [place] 10/13/20 [phone]

## 2020-10-21 ENCOUNTER — Telehealth (INDEPENDENT_AMBULATORY_CARE_PROVIDER_SITE_OTHER): Payer: Self-pay

## 2020-10-21 NOTE — Telephone Encounter (Signed)
Over-the-counter medications like Chloraseptic spray, NyQuil and DayQuil can be used.

## 2020-10-21 NOTE — Telephone Encounter (Signed)
Pt has tested positive for covid and requesting medication for her sore throat.

## 2020-10-21 NOTE — Telephone Encounter (Signed)
Please contact patient. [name], CMA   Copied from [place] [phone]. Topic: General - Inquiry >> Oct 21, 2020  8:24 AM [name] D wrote: Reason for CRM: Patient would like  a call back from Dr. Margarita Rana or her nurse. She has tested positive for covid and needs some medication for her throat. She can be reached at [phone]. Please advise

## 2020-10-21 NOTE — Telephone Encounter (Signed)
Pt was called and informed of OTC medications to take.

## 2020-12-07 ENCOUNTER — Ambulatory Visit: Payer: BC Managed Care – PPO | Attending: Family Medicine | Admitting: Family Medicine

## 2020-12-07 ENCOUNTER — Encounter: Payer: Self-pay | Admitting: Family Medicine

## 2020-12-07 ENCOUNTER — Other Ambulatory Visit: Payer: Self-pay

## 2020-12-07 VITALS — BP 157/82 | HR 96 | Ht 66.0 in | Wt 235.2 lb

## 2020-12-07 DIAGNOSIS — I152 Hypertension secondary to endocrine disorders: Secondary | ICD-10-CM

## 2020-12-07 DIAGNOSIS — E1169 Type 2 diabetes mellitus with other specified complication: Secondary | ICD-10-CM

## 2020-12-07 DIAGNOSIS — U099 Post covid-19 condition, unspecified: Secondary | ICD-10-CM

## 2020-12-07 DIAGNOSIS — L84 Corns and callosities: Secondary | ICD-10-CM | POA: Diagnosis not present

## 2020-12-07 DIAGNOSIS — E1159 Type 2 diabetes mellitus with other circulatory complications: Secondary | ICD-10-CM

## 2020-12-07 DIAGNOSIS — E1149 Type 2 diabetes mellitus with other diabetic neurological complication: Secondary | ICD-10-CM

## 2020-12-07 LAB — POCT GLYCOSYLATED HEMOGLOBIN (HGB A1C): HbA1c, POC (controlled diabetic range): 8.7 % — AB (ref 0.0–7.0)

## 2020-12-07 LAB — GLUCOSE, POCT (MANUAL RESULT ENTRY): POC Glucose: 315 mg/dl — AB (ref 70–99)

## 2020-12-07 MED ORDER — BENZONATATE 100 MG PO CAPS: 100.0000 mg | ORAL_CAPSULE | Freq: Three times a day (TID) | ORAL | 0 refills | Status: DC | PRN

## 2020-12-07 MED ORDER — DAPAGLIFLOZIN PROPANEDIOL 10 MG PO TABS: 10.0000 mg | ORAL_TABLET | Freq: Every day | ORAL | 6 refills | Status: DC

## 2020-12-07 MED ORDER — AMLODIPINE BESYLATE 5 MG PO TABS: 5.0000 mg | ORAL_TABLET | Freq: Every day | ORAL | 6 refills | Status: DC

## 2020-12-07 NOTE — Progress Notes (Signed)
Needs refill on medication

## 2020-12-07 NOTE — Patient Instructions (Signed)
Diabetes Mellitus and Foot Care Foot care is an important part of your health, especially when you have diabetes. Diabetes may cause you to have problems because of poor blood flow (circulation) to your feet and legs, which can cause your skin to:  Become thinner and drier.  Break more easily.  Heal more slowly.  Peel and crack. You may also have nerve damage (neuropathy) in your legs and feet, causing decreased feeling in them. This means that you may not notice minor injuries to your feet that could lead to more serious problems. Noticing and addressing any potential problems early is the best way to prevent future foot problems. How to care for your feet Foot hygiene  Wash your feet daily with warm water and mild soap. Do not use hot water. Then, pat your feet and the areas between your toes until they are completely dry. Do not soak your feet as this can dry your skin.  Trim your toenails straight across. Do not dig under them or around the cuticle. File the edges of your nails with an emery board or nail file.  Apply a moisturizing lotion or petroleum jelly to the skin on your feet and to dry, brittle toenails. Use lotion that does not contain alcohol and is unscented. Do not apply lotion between your toes.   Shoes and socks  Wear clean socks or stockings every day. Make sure they are not too tight. Do not wear knee-high stockings since they may decrease blood flow to your legs.  Wear shoes that fit properly and have enough cushioning. Always look in your shoes before you put them on to be sure there are no objects inside.  To break in new shoes, wear them for just a few hours a day. This prevents injuries on your feet. Wounds, scrapes, corns, and calluses  Check your feet daily for blisters, cuts, bruises, sores, and redness. If you cannot see the bottom of your feet, use a mirror or ask someone for help.  Do not cut corns or calluses or try to remove them with medicine.  If you  find a minor scrape, cut, or break in the skin on your feet, keep it and the skin around it clean and dry. You may clean these areas with mild soap and water. Do not clean the area with peroxide, alcohol, or iodine.  If you have a wound, scrape, corn, or callus on your foot, look at it several times a day to make sure it is healing and not infected. Check for: ? Redness, swelling, or pain. ? Fluid or blood. ? Warmth. ? Pus or a bad smell.   General tips  Do not cross your legs. This may decrease blood flow to your feet.  Do not use heating pads or hot water bottles on your feet. They may burn your skin. If you have lost feeling in your feet or legs, you may not know this is happening until it is too late.  Protect your feet from hot and cold by wearing shoes, such as at the beach or on hot pavement.  Schedule a complete foot exam at least once a year (annually) or more often if you have foot problems. Report any cuts, sores, or bruises to your health care provider immediately. Where to find more information  American Diabetes Association: www.diabetes.org  Association of Diabetes Care & Education Specialists: www.diabeteseducator.org Contact a health care provider if:  You have a medical condition that increases your risk of infection and   you have any cuts, sores, or bruises on your feet.  You have an injury that is not healing.  You have redness on your legs or feet.  You feel burning or tingling in your legs or feet.  You have pain or cramps in your legs and feet.  Your legs or feet are numb.  Your feet always feel cold.  You have pain around any toenails. Get help right away if:  You have a wound, scrape, corn, or callus on your foot and: ? You have pain, swelling, or redness that gets worse. ? You have fluid or blood coming from the wound, scrape, corn, or callus. ? Your wound, scrape, corn, or callus feels warm to the touch. ? You have pus or a bad smell coming from  the wound, scrape, corn, or callus. ? You have a fever. ? You have a red line going up your leg. Summary  Check your feet every day for blisters, cuts, bruises, sores, and redness.  Apply a moisturizing lotion or petroleum jelly to the skin on your feet and to dry, brittle toenails.  Wear shoes that fit properly and have enough cushioning.  If you have foot problems, report any cuts, sores, or bruises to your health care provider immediately.  Schedule a complete foot exam at least once a year (annually) or more often if you have foot problems. This information is not intended to replace advice given to you by your health care provider. Make sure you discuss any questions you have with your health care provider. Document Revised: 04/08/2020 Document Reviewed: 04/08/2020 Elsevier Patient Education  2021 [employer] Inc.  

## 2020-12-07 NOTE — Progress Notes (Signed)
Subjective:  Patient ID: Sheila Bishop, female    DOB: 05/14/1971  Age: 50 y.o. MRN: [id number]  CC: Diabetes   HPI Sheila Bishop  is a 50 year old female with history of type 2 diabetes mellitus (A1c 8.7), hypertension, hyperlipidemia who presents today for a follow-up visit.  Her BP has been elevated for the last 2 days and she has had headaches despite being compliant with her antihypertensive.. Ever since she has COVID in 10/2020 she has had residual fatigue. Some nights she still wheezes and has a dry cough.  With regards to her diabetes mellitus she has been compliant with her medications but not with a diabetic diet but promises to do better.  Diabetic neuropathy is controlled on gabapentin. Last eye exam was on 11/21/19.  She does not exercise regularly but promises to be more compliant with this. She has no additional concerns at this time.  Past Medical History:  Diagnosis Date  . Breast abscess Nov 2012   left  . Depression 2007  . Diabetes mellitus 2009  . Headache(784.0)   . Hypertension 2009  . Obesity   . Wears glasses     Past Surgical History:  Procedure Laterality Date  . BREAST BIOPSY  08/07/2011   Procedure: BREAST BIOPSY;  Surgeon: Joyice Faster. Cornett, MD;  Location: [facility];  Service: General;  Laterality: Left;  BIOPSY BREAST LEFT SIDE  . CESAREAN SECTION  2002    Family History  Problem Relation Age of Onset  . Hyperlipidemia Mother   . Hypertension Mother   . Heart disease Mother     No Known Allergies  Outpatient Medications Prior to Visit  Medication Sig Dispense Refill  . albuterol (VENTOLIN HFA) 108 (90 Base) MCG/ACT inhaler Inhale 2 puffs into the lungs every 6 (six) hours as needed for wheezing or shortness of breath. 18 g 1  . atorvastatin (LIPITOR) 40 MG tablet Take 1 tablet (40 mg total) by mouth daily. 30 tablet 6  . Blood Glucose Monitoring Suppl (CONTOUR NEXT MONITOR) w/Device KIT Use to check blood sugar twice daily. E11.69 1  kit 0  . gabapentin (NEURONTIN) 300 MG capsule Take 1 capsule (300 mg total) by mouth at bedtime. 30 capsule 6  . glipiZIDE (GLUCOTROL) 10 MG tablet Take 2 tablets (20 mg total) by mouth 2 (two) times daily before a meal. 120 tablet 6  . glucose blood (CONTOUR NEXT TEST) test strip Use to check blood sugar twice daily. E11.69 100 each 6  . Insulin Pen Needle (BD PEN NEEDLE NANO U/F) 32G X 4 MM MISC USE AS DIRECTED TO  INJECT  VICTOZA 100 each 4  . liraglutide (VICTOZA) 18 MG/3ML SOPN Inject 1.8 mg into the skin daily with breakfast. 9 mL 6  . lisinopril-hydrochlorothiazide (ZESTORETIC) 20-12.5 MG tablet Take 2 tablets by mouth daily. 60 tablet 6  . Microlet Lancets MISC Use to check blood sugar up to 3 times daily. 100 each 11  . Microlet Lancets MISC Use to check blood sugar twice daily. E11.69 100 each 6  . nystatin-triamcinolone (MYCOLOG II) cream Apply to affected area daily 15 g 0  . dapagliflozin propanediol (FARXIGA) 5 MG TABS tablet Take 1 tablet (5 mg total) by mouth daily before breakfast. 30 tablet 6  . cetirizine (ZYRTEC ALLERGY) 10 MG tablet Take 1 tablet (10 mg total) by mouth daily. (Patient not taking: Reported on 12/07/2020) 30 tablet 0  . fluconazole (DIFLUCAN) 150 MG tablet Take 1 tablet (150 mg total) by  mouth daily. (Patient not taking: Reported on 12/07/2020) 2 tablet 0  . ibuprofen (ADVIL,MOTRIN) 800 MG tablet Take 1 tablet (800 mg total) by mouth 3 (three) times daily. (Patient not taking: No sig reported) 21 tablet 0  . loperamide (IMODIUM A-D) 2 MG capsule 2 mg Q 6 hrs PRN (Patient not taking: Reported on 12/07/2020) 12 capsule 0  . meloxicam (MOBIC) 15 MG tablet Take 15 mg by mouth daily. (Patient not taking: Reported on 12/07/2020)    . mupirocin cream (BACTROBAN) 2 % Apply 1 application topically 2 (two) times daily. (Patient not taking: Reported on 12/07/2020) 15 g 0  . neomycin-polymyxin-hydrocortisone (CORTISPORIN) OTIC solution Apply 2-3 drops to the ingrown toenail site twice  daily. Cover with band-aid. (Patient not taking: Reported on 12/07/2020) 10 mL 0  . ondansetron (ZOFRAN ODT) 4 MG disintegrating tablet Take 1 tablet (4 mg total) by mouth every 8 (eight) hours as needed for nausea or vomiting. (Patient not taking: Reported on 12/07/2020) 20 tablet 0  . promethazine-dextromethorphan (PROMETHAZINE-DM) 6.25-15 MG/5ML syrup Take 5 mLs by mouth at bedtime as needed for cough. (Patient not taking: Reported on 12/07/2020) 100 mL 0  . benzonatate (TESSALON) 100 MG capsule Take 1-2 capsules (100-200 mg total) by mouth 3 (three) times daily as needed for cough. (Patient not taking: Reported on 12/07/2020) 60 capsule 0   No facility-administered medications prior to visit.     ROS Review of Systems  Constitutional: Negative for activity change, appetite change and fatigue.  HENT: Negative for congestion, sinus pressure and sore throat.   Eyes: Negative for visual disturbance.  Respiratory: Negative for cough, chest tightness, shortness of breath and wheezing.   Cardiovascular: Negative for chest pain and palpitations.  Gastrointestinal: Negative for abdominal distention, abdominal pain and constipation.  Endocrine: Negative for polydipsia.  Genitourinary: Negative for dysuria and frequency.  Musculoskeletal: Negative for arthralgias and back pain.  Skin: Negative for rash.  Neurological: Negative for tremors, light-headedness and numbness.  Hematological: Does not bruise/bleed easily.  Psychiatric/Behavioral: Negative for agitation and behavioral problems.    Objective:  BP (!) 157/82   Pulse 96   Ht 5' 6" (1.676 m)   Wt 235 lb 3.2 oz (106.7 kg)   SpO2 99%   BMI 37.96 kg/m   BP/Weight 12/07/2020 10/13/2020 32/06/9241  Systolic BP 683 419 622  Diastolic BP 82 80 78  Wt. (Lbs) 235.2 - 236.6  BMI 37.96 - 38.19      Physical Exam Constitutional:      Appearance: She is well-developed. She is obese.  Neck:     Vascular: No JVD.  Cardiovascular:     Rate and  Rhythm: Normal rate.     Heart sounds: Normal heart sounds. No murmur heard.   Pulmonary:     Effort: Pulmonary effort is normal.     Breath sounds: Normal breath sounds. No wheezing or rales.  Chest:     Chest wall: No tenderness.  Abdominal:     General: Bowel sounds are normal. There is no distension.     Palpations: Abdomen is soft. There is no mass.     Tenderness: There is no abdominal tenderness.  Musculoskeletal:        General: Normal range of motion.     Right lower leg: No edema.     Left lower leg: No edema.  Neurological:     Mental Status: She is alert and oriented to person, place, and time.  Psychiatric:  Mood and Affect: Mood normal.    Diabetic Foot Exam - Simple   Simple Foot Form Visual Inspection See comments: Yes Sensation Testing Intact to touch and monofilament testing bilaterally: Yes Pulse Check Posterior Tibialis and Dorsalis pulse intact bilaterally: Yes Comments Left foot with calluses on anterior aspect of sole.  No ulcerations or skin breakdown.      CMP Latest Ref Rng & Units 09/06/2020 12/24/2019 05/19/2019  Glucose 65 - 99 mg/dL 119(H) 97 98  BUN 6 - 24 mg/dL 28(H) 18 26(H)  Creatinine 0.57 - 1.00 mg/dL 0.93 0.73 0.92  Sodium 134 - 144 mmol/L 141 141 143  Potassium 3.5 - 5.2 mmol/L 3.8 3.8 4.2  Chloride 96 - 106 mmol/L 101 104 104  CO2 20 - 29 mmol/L _0 Calcium 8.7 - 10.2 mg/dL 9.7 9.2 9.6  Total Protein 6.0 - 8.5 g/dL - 6.5 -  Total Bilirubin 0.0 - 1.2 mg/dL - <0.2 -  Alkaline Phos 39 - 117 IU/L - 114 -  AST 0 - 40 IU/L - 13 -  ALT 0 - 32 IU/L - 12 -    Lipid Panel     Component Value Date/Time   CHOL 134 12/24/2019 1618   TRIG 102 12/24/2019 1618   HDL 43 12/24/2019 1618   CHOLHDL 3.1 12/24/2019 1618   CHOLHDL 3.9 11/01/2016 1608   VLDL 18 06/16/2010 2054   LDLCALC 72 12/24/2019 1618   LDLDIRECT 131 (H) 04/21/2010 2016    CBC    Component Value Date/Time   WBC 13.4 (H) 12/24/2019 1618   WBC 14.6 (H)  12/08/2017 2135   RBC 4.11 12/24/2019 1618   RBC 3.91 12/08/2017 2135   HGB 10.4 (L) 12/24/2019 1618   HCT 31.7 (L) 12/24/2019 1618   PLT 297 12/24/2019 1618   MCV 77 (L) 12/24/2019 1618   MCH 25.3 (L) 12/24/2019 1618   MCH 26.1 12/08/2017 2135   MCHC 32.8 12/24/2019 1618   MCHC 33.1 12/08/2017 2135   RDW 14.3 12/24/2019 1618   LYMPHSABS 3.2 (H) 12/24/2019 1618   MONOABS 610 02/23/2016 1052   EOSABS 0.4 12/24/2019 1618   BASOSABS 0.1 12/24/2019 1618    Lab Results  Component Value Date   HGBA1C 8.7 (A) 12/07/2020    Assessment & Plan:  1. Type 2 diabetes mellitus with other specified complication, without long-term current use of insulin (HCC) Uncontrolled with A1c of 8.7 Farxiga dose has been increased Counseled on Diabetic diet, my plate method, 287 minutes of moderate intensity exercise/week Blood sugar logs with fasting goals of 80-120 mg/dl, random of less than 180 and in the event of sugars less than 60 mg/dl or greater than 400 mg/dl encouraged to notify the clinic. Advised on the need for annual eye exams, annual foot exams, Pneumonia vaccine. - POCT glucose (manual entry) - POCT glycosylated hemoglobin (Hb A1C)  2. Type 2 diabetes mellitus with other neurologic complication, without long-term current use of insulin (HCC) Neuropathy is controlled. - dapagliflozin propanediol (FARXIGA) 10 MG TABS tablet; Take 1 tablet (10 mg total) by mouth daily before breakfast.  Dispense: 30 tablet; Refill: 6  3. Foot callus Referred to podiatry in the past but the patient was not overly impressed with her visit Discussed foot care  4. Hypertension associated with diabetes ([place]) Uncontrolled Amlodipine added to regimen Counseled on blood pressure goal of less than 130/80, low-sodium, DASH diet, medication compliance, 150 minutes of moderate intensity exercise per week. Discussed medication compliance, adverse effects. -  amLODipine (NORVASC) 5 MG tablet; Take 1 tablet (5 mg  total) by mouth daily.  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  Dispense: 30 capsule; Refill: 0   Meds ordered this encounter  Medications  . benzonatate (TESSALON) 100 MG capsule    Sig: Take 1-2 capsules (100-200 mg total) by mouth 3 (three) times daily as needed for cough.    Dispense:  30 capsule    Refill:  0  . dapagliflozin propanediol (FARXIGA) 10 MG TABS tablet    Sig: Take 1 tablet (10 mg total) by mouth daily before breakfast.    Dispense:  30 tablet    Refill:  6    Dose increase  . amLODipine (NORVASC) 5 MG tablet    Sig: Take 1 tablet (5 mg total) by mouth daily.    Dispense:  30 tablet    Refill:  6    Follow-up: Return in about 3 months (around 03/09/2021) for Chronic disease management.       Charlott Rakes, MD, FAAFP. [facility] and [facility] [place], [place]   12/07/2020, 12:35 PM

## 2021-03-09 ENCOUNTER — Other Ambulatory Visit: Payer: Self-pay

## 2021-03-09 ENCOUNTER — Other Ambulatory Visit: Payer: Self-pay | Admitting: Family Medicine

## 2021-03-09 ENCOUNTER — Encounter: Payer: Self-pay | Admitting: Family Medicine

## 2021-03-09 ENCOUNTER — Ambulatory Visit: Payer: BC Managed Care – PPO | Attending: Family Medicine | Admitting: Family Medicine

## 2021-03-09 VITALS — BP 126/81 | HR 84 | Ht 66.0 in | Wt 230.4 lb

## 2021-03-09 DIAGNOSIS — E1159 Type 2 diabetes mellitus with other circulatory complications: Secondary | ICD-10-CM | POA: Diagnosis not present

## 2021-03-09 DIAGNOSIS — E785 Hyperlipidemia, unspecified: Secondary | ICD-10-CM

## 2021-03-09 DIAGNOSIS — E1149 Type 2 diabetes mellitus with other diabetic neurological complication: Secondary | ICD-10-CM | POA: Diagnosis not present

## 2021-03-09 DIAGNOSIS — I152 Hypertension secondary to endocrine disorders: Secondary | ICD-10-CM

## 2021-03-09 DIAGNOSIS — M722 Plantar fascial fibromatosis: Secondary | ICD-10-CM

## 2021-03-09 DIAGNOSIS — E1169 Type 2 diabetes mellitus with other specified complication: Secondary | ICD-10-CM

## 2021-03-09 LAB — POCT GLYCOSYLATED HEMOGLOBIN (HGB A1C): HbA1c, POC (controlled diabetic range): 9 % — AB (ref 0.0–7.0)

## 2021-03-09 LAB — GLUCOSE, POCT (MANUAL RESULT ENTRY): POC Glucose: 240 mg/dl — AB (ref 70–99)

## 2021-03-09 MED ORDER — DAPAGLIFLOZIN PROPANEDIOL 10 MG PO TABS: 10.0000 mg | ORAL_TABLET | Freq: Every day | ORAL | 6 refills | Status: DC

## 2021-03-09 MED ORDER — ATORVASTATIN CALCIUM 40 MG PO TABS: 40.0000 mg | ORAL_TABLET | Freq: Every day | ORAL | 6 refills | Status: DC

## 2021-03-09 MED ORDER — GLIPIZIDE 10 MG PO TABS: 20.0000 mg | ORAL_TABLET | Freq: Two times a day (BID) | ORAL | 6 refills | Status: DC

## 2021-03-09 MED ORDER — LISINOPRIL-HYDROCHLOROTHIAZIDE 20-12.5 MG PO TABS
2.0000 | ORAL_TABLET | Freq: Every day | ORAL | 6 refills | Status: DC
Start: 2021-03-09 — End: 2023-04-10

## 2021-03-09 MED ORDER — DICLOFENAC SODIUM 1 % EX GEL: 4.0000 g | Freq: Four times a day (QID) | CUTANEOUS | 1 refills | Status: DC

## 2021-03-09 MED ORDER — AMLODIPINE BESYLATE 5 MG PO TABS: 5.0000 mg | ORAL_TABLET | Freq: Every day | ORAL | 6 refills | Status: DC

## 2021-03-09 MED ORDER — GABAPENTIN 300 MG PO CAPS: 300.0000 mg | ORAL_CAPSULE | Freq: Every day | ORAL | 6 refills | Status: DC

## 2021-03-09 MED ORDER — TRULICITY 1.5 MG/0.5ML ~~LOC~~ SOAJ: 1.5000 mg | SUBCUTANEOUS | 1 refills | Status: DC

## 2021-03-09 NOTE — Progress Notes (Signed)
Subjective:  Patient ID: Sheila Bishop, female    DOB: 1970-11-18  Age: 50 y.o. MRN: [id number]  CC: Diabetes   HPI Sheila Bishop is a 50 year old female with history of type 2 diabetes mellitus (A1c9.0), hypertension, hyperlipidemia who presents today for a follow-up visit.   Interval History: Her feet have been painful especially in the heel. She takes Gabapentin at night but cannot take anything during the day due to sedation from Gabapentin. 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 Symptoms are worse in her left foot. Thick socks have not helped. When she stands on the mat at work symptoms are controlled but as soon as she gets of the max symptoms return.  Her A1c is 9.0, up from 8.7 previously.  She endorses compliance with her medications and her dose of Wilder Glade has been increased at her last office visit.  She has not been consistent with her exercise and compliance with a diabetic diet cannot be ascertained.  Up-to-date on her annual eye exam.  Doing well on her antihypertensive and her statin. Past Medical History:  Diagnosis Date  . Breast abscess Nov 2012   left  . Depression 2007  . Diabetes mellitus 2009  . Headache(784.0)   . Hypertension 2009  . Obesity   . Wears glasses     Past Surgical History:  Procedure Laterality Date  . BREAST BIOPSY  08/07/2011   Procedure: BREAST BIOPSY;  Surgeon: Joyice Faster. Cornett, MD;  Location: [facility];  Service: General;  Laterality: Left;  BIOPSY BREAST LEFT SIDE  . CESAREAN SECTION  2002    Family History  Problem Relation Age of Onset  . Hyperlipidemia Mother   . Hypertension Mother   . Heart disease Mother     No Known Allergies  Outpatient Medications Prior to Visit  Medication Sig Dispense Refill  . albuterol (VENTOLIN HFA) 108 (90 Base) MCG/ACT inhaler Inhale 2 puffs into the lungs every 6 (six) hours as needed for wheezing or shortness of breath. 18 g 1  . Blood Glucose Monitoring Suppl  (CONTOUR NEXT MONITOR) w/Device KIT Use to check blood sugar twice daily. E11.69 1 kit 0  . glucose blood (CONTOUR NEXT TEST) test strip Use to check blood sugar twice daily. E11.69 100 each 6  . Insulin Pen Needle (BD PEN NEEDLE NANO U/F) 32G X 4 MM MISC USE AS DIRECTED TO  INJECT  VICTOZA 100 each 4  . Microlet Lancets MISC Use to check blood sugar up to 3 times daily. 100 each 11  . Microlet Lancets MISC Use to check blood sugar twice daily. E11.69 100 each 6  . neomycin-polymyxin-hydrocortisone (CORTISPORIN) OTIC solution Apply 2-3 drops to the ingrown toenail site twice daily. Cover with band-aid. 10 mL 0  . amLODipine (NORVASC) 5 MG tablet Take 1 tablet (5 mg total) by mouth daily. 30 tablet 6  . atorvastatin (LIPITOR) 40 MG tablet Take 1 tablet (40 mg total) by mouth daily. 30 tablet 6  . dapagliflozin propanediol (FARXIGA) 10 MG TABS tablet Take 1 tablet (10 mg total) by mouth daily before breakfast. 30 tablet 6  . gabapentin (NEURONTIN) 300 MG capsule Take 1 capsule (300 mg total) by mouth at bedtime. 30 capsule 6  . glipiZIDE (GLUCOTROL) 10 MG tablet Take 2 tablets (20 mg total) by mouth 2 (two) times daily before a meal. 120 tablet 6  . liraglutide (VICTOZA) 18 MG/3ML SOPN Inject 1.8 mg into the skin daily with  breakfast. 9 mL 6  . lisinopril-hydrochlorothiazide (ZESTORETIC) 20-12.5 MG tablet Take 2 tablets by mouth daily. 60 tablet 6  . benzonatate (TESSALON) 100 MG capsule Take 1-2 capsules (100-200 mg total) by mouth 3 (three) times daily as needed for cough. (Patient not taking: Reported on 03/09/2021) 30 capsule 0  . cetirizine (ZYRTEC ALLERGY) 10 MG tablet Take 1 tablet (10 mg total) by mouth daily. (Patient not taking: No sig reported) 30 tablet 0  . fluconazole (DIFLUCAN) 150 MG tablet Take 1 tablet (150 mg total) by mouth daily. (Patient not taking: No sig reported) 2 tablet 0  . ibuprofen (ADVIL,MOTRIN) 800 MG tablet Take 1 tablet (800 mg total) by mouth 3 (three) times daily.  (Patient not taking: No sig reported) 21 tablet 0  . loperamide (IMODIUM A-D) 2 MG capsule 2 mg Q 6 hrs PRN (Patient not taking: No sig reported) 12 capsule 0  . meloxicam (MOBIC) 15 MG tablet Take 15 mg by mouth daily. (Patient not taking: No sig reported)    . mupirocin cream (BACTROBAN) 2 % Apply 1 application topically 2 (two) times daily. (Patient not taking: No sig reported) 15 g 0  . nystatin-triamcinolone (MYCOLOG II) cream Apply to affected area daily (Patient not taking: Reported on 03/09/2021) 15 g 0  . ondansetron (ZOFRAN ODT) 4 MG disintegrating tablet Take 1 tablet (4 mg total) by mouth every 8 (eight) hours as needed for nausea or vomiting. (Patient not taking: No sig reported) 20 tablet 0  . promethazine-dextromethorphan (PROMETHAZINE-DM) 6.25-15 MG/5ML syrup Take 5 mLs by mouth at bedtime as needed for cough. (Patient not taking: No sig reported) 100 mL 0   No facility-administered medications prior to visit.     ROS Review of Systems  Constitutional: Negative for activity change, appetite change and fatigue.  HENT: Negative for congestion, sinus pressure and sore throat.   Eyes: Negative for visual disturbance.  Respiratory: Negative for cough, chest tightness, shortness of breath and wheezing.   Cardiovascular: Negative for chest pain and palpitations.  Gastrointestinal: Negative for abdominal distention, abdominal pain and constipation.  Endocrine: Negative for polydipsia.  Genitourinary: Negative for dysuria and frequency.  Musculoskeletal:       See HPI  Skin: Negative for rash.  Neurological: Positive for numbness. Negative for tremors and light-headedness.  Hematological: Does not bruise/bleed easily.  Psychiatric/Behavioral: Negative for agitation and behavioral problems.    Objective:  BP 126/81   Pulse 84   Ht $R'5\' 6"'Rw$  (1.676 m)   Wt 230 lb 6.4 oz (104.5 kg)   SpO2 100%   BMI 37.19 kg/m   BP/Weight 03/09/2021 12/07/2020 2/54/2706  Systolic BP 237 628 315   Diastolic BP 81 82 80  Wt. (Lbs) 230.4 235.2 -  BMI 37.19 37.96 -      Physical Exam Constitutional:      Appearance: She is well-developed.  Neck:     Vascular: No JVD.  Cardiovascular:     Rate and Rhythm: Normal rate.     Heart sounds: Normal heart sounds. No murmur heard.   Pulmonary:     Effort: Pulmonary effort is normal.     Breath sounds: Normal breath sounds. No wheezing or rales.  Chest:     Chest wall: No tenderness.  Abdominal:     General: Bowel sounds are normal. There is no distension.     Palpations: Abdomen is soft. There is no mass.     Tenderness: There is no abdominal tenderness.  Musculoskeletal:  General: Normal range of motion.     Right lower leg: No edema.     Left lower leg: No edema.     Comments: Tenderness on palpation of heel bilaterally  Neurological:     Mental Status: She is alert and oriented to person, place, and time.  Psychiatric:        Mood and Affect: Mood normal.    Diabetic Foot Exam - Simple   Simple Foot Form Visual Inspection See comments: Yes Sensation Testing Intact to touch and monofilament testing bilaterally: Yes Pulse Check Posterior Tibialis and Dorsalis pulse intact bilaterally: Yes Comments Calluses on anterior aspect of sole bilaterally.  No ulceration or skin breakdown.  Tenderness on palpation of heel.      CMP Latest Ref Rng & Units 09/06/2020 12/24/2019 05/19/2019  Glucose 65 - 99 mg/dL 119(H) 97 98  BUN 6 - 24 mg/dL 28(H) 18 26(H)  Creatinine 0.57 - 1.00 mg/dL 0.93 0.73 0.92  Sodium 134 - 144 mmol/L 141 141 143  Potassium 3.5 - 5.2 mmol/L 3.8 3.8 4.2  Chloride 96 - 106 mmol/L 101 104 104  CO2 20 - 29 mmol/L $RemoveB'25 23 25  'lwzBZXJD$ Calcium 8.7 - 10.2 mg/dL 9.7 9.2 9.6  Total Protein 6.0 - 8.5 g/dL - 6.5 -  Total Bilirubin 0.0 - 1.2 mg/dL - <0.2 -  Alkaline Phos 39 - 117 IU/L - 114 -  AST 0 - 40 IU/L - 13 -  ALT 0 - 32 IU/L - 12 -    Lipid Panel     Component Value Date/Time   CHOL 134 12/24/2019  1618   TRIG 102 12/24/2019 1618   HDL 43 12/24/2019 1618   CHOLHDL 3.1 12/24/2019 1618   CHOLHDL 3.9 11/01/2016 1608   VLDL 18 06/16/2010 2054   LDLCALC 72 12/24/2019 1618   LDLDIRECT 131 (H) 04/21/2010 2016    CBC    Component Value Date/Time   WBC 13.4 (H) 12/24/2019 1618   WBC 14.6 (H) 12/08/2017 2135   RBC 4.11 12/24/2019 1618   RBC 3.91 12/08/2017 2135   HGB 10.4 (L) 12/24/2019 1618   HCT 31.7 (L) 12/24/2019 1618   PLT 297 12/24/2019 1618   MCV 77 (L) 12/24/2019 1618   MCH 25.3 (L) 12/24/2019 1618   MCH 26.1 12/08/2017 2135   MCHC 32.8 12/24/2019 1618   MCHC 33.1 12/08/2017 2135   RDW 14.3 12/24/2019 1618   LYMPHSABS 3.2 (H) 12/24/2019 1618   MONOABS 610 02/23/2016 1052   EOSABS 0.4 12/24/2019 1618   BASOSABS 0.1 12/24/2019 1618    Lab Results  Component Value Date   HGBA1C 9.0 (A) 03/09/2021    Assessment & Plan:  1. Type 2 diabetes mellitus with other specified complication, without long-term current use of insulin (HCC) Uncontrolled with A1c of 9.0 Switched from Victoza to [employer] and she will follow-up with the clinical pharmacist for up titration of Trulicity dose with a goal of continuing to maximum Trulicity dose. She would need to be more compliant with a regular exercise regimen and weight loss Counseled on Diabetic diet, my plate method, 211 minutes of moderate intensity exercise/week Blood sugar logs with fasting goals of 80-120 mg/dl, random of less than 180 and in the event of sugars less than 60 mg/dl or greater than 400 mg/dl encouraged to notify the clinic. Advised on the need for annual eye exams, annual foot exams, Pneumonia vaccine. - POCT glucose (manual entry) - POCT glycosylated hemoglobin (Hb A1C) - Dulaglutide (TRULICITY) 1.5  MG/0.5ML SOPN; Inject 1.5 mg into the skin once a week.  Dispense: 2 mL; Refill: 1 - Microalbumin / creatinine urine ratio - CMP14+EGFR - Lipid panel  2. Dyslipidemia associated with type 2 diabetes mellitus  (HCC) Controlled Low-cholesterol diet - atorvastatin (LIPITOR) 40 MG tablet; Take 1 tablet (40 mg total) by mouth daily.  Dispense: 30 tablet; Refill: 6  3. Hypertension associated with diabetes ([place]) Controlled Counseled on blood pressure goal of less than 130/80, low-sodium, DASH diet, medication compliance, 150 minutes of moderate intensity exercise per week. Discussed medication compliance, adverse effects. - amLODipine (NORVASC) 5 MG tablet; Take 1 tablet (5 mg total) by mouth daily.  Dispense: 30 tablet; Refill: 6 - lisinopril-hydrochlorothiazide (ZESTORETIC) 20-12.5 MG tablet; Take 2 tablets by mouth daily.  Dispense: 60 tablet; Refill: 6  4. Type 2 diabetes mellitus with other neurologic complication, without long-term current use of insulin (HCC) Uncontrolled due to prolonged standing which is exacerbated by superimposed plantar fasciitis Unable to take gabapentin during the day due to sedation.  If treatment for plantar fasciitis does not bring about improvement consider adding Cymbalta - dapagliflozin propanediol (FARXIGA) 10 MG TABS tablet; Take 1 tablet (10 mg total) by mouth daily before breakfast.  Dispense: 30 tablet; Refill: 6 - gabapentin (NEURONTIN) 300 MG capsule; Take 1 capsule (300 mg total) by mouth at bedtime.  Dispense: 30 capsule; Refill: 6 - glipiZIDE (GLUCOTROL) 10 MG tablet; Take 2 tablets (20 mg total) by mouth 2 (two) times daily before a meal.  Dispense: 120 tablet; Refill: 6  5. Plantar fasciitis Advised to obtain insoles. We have discussed plantar fasciitis exercises She will benefit from having diabetic shoes and has been referred Topical Voltaren gel - Ambulatory referral to Podiatry - diclofenac Sodium (VOLTAREN) 1 % GEL; Apply 4 g topically 4 (four) times daily.  Dispense: 100 g; Refill: 1   Health Care Maintenance: At next visit Meds ordered this encounter  Medications  . Dulaglutide (TRULICITY) 1.5 DH/7.4BU SOPN    Sig: Inject 1.5 mg into the  skin once a week.    Dispense:  2 mL    Refill:  1    Discontinue Victoza  . atorvastatin (LIPITOR) 40 MG tablet    Sig: Take 1 tablet (40 mg total) by mouth daily.    Dispense:  30 tablet    Refill:  6  . amLODipine (NORVASC) 5 MG tablet    Sig: Take 1 tablet (5 mg total) by mouth daily.    Dispense:  30 tablet    Refill:  6  . dapagliflozin propanediol (FARXIGA) 10 MG TABS tablet    Sig: Take 1 tablet (10 mg total) by mouth daily before breakfast.    Dispense:  30 tablet    Refill:  6    Dose increase  . gabapentin (NEURONTIN) 300 MG capsule    Sig: Take 1 capsule (300 mg total) by mouth at bedtime.    Dispense:  30 capsule    Refill:  6  . glipiZIDE (GLUCOTROL) 10 MG tablet    Sig: Take 2 tablets (20 mg total) by mouth 2 (two) times daily before a meal.    Dispense:  120 tablet    Refill:  6    Discontinue previous dose  . lisinopril-hydrochlorothiazide (ZESTORETIC) 20-12.5 MG tablet    Sig: Take 2 tablets by mouth daily.    Dispense:  60 tablet    Refill:  6  . diclofenac Sodium (VOLTAREN) 1 % GEL    Sig:  Apply 4 g topically 4 (four) times daily.    Dispense:  100 g    Refill:  1    Follow-up: Return in about 1 month (around 04/08/2021) for [facility] for titration of Trulicity; PCP 4 months.Charlott Rakes, MD, FAAFP. [facility] and [place], [place]   03/09/2021, 9:00 AM

## 2021-03-09 NOTE — Patient Instructions (Signed)
Plantar Fasciitis  Plantar fasciitis is a painful foot condition that affects the heel. It occurs when the band of tissue that connects the toes to the heel bone (plantar fascia) becomes irritated. This can happen as the result of exercising too much or doing other repetitive activities (overuse injury). Plantar fasciitis can cause mild irritation to severe pain that makes it difficult to walk or move. The pain is usually worse in the morning after sleeping, or after sitting or lying down for a period of time. Pain may also be worse after long periods of walking or standing. What are the causes? This condition may be caused by:  Standing for long periods of time.  Wearing shoes that do not have good arch support.  Doing activities that put stress on joints (high-impact activities). This includes ballet and exercise that makes your heart beat faster (aerobic exercise), such as running.  Being overweight.  An abnormal way of walking (gait).  Tight muscles in the back of your lower leg (calf).  High arches in your feet or flat feet.  Starting a new athletic activity. What are the signs or symptoms? The main symptom of this condition is heel pain. Pain may get worse after the following:  Taking the first steps after a time of rest, especially in the morning after awakening, or after you have been sitting or lying down for a while.  Long periods of standing still. Pain may decrease after 30-45 minutes of activity, such as gentle walking. How is this diagnosed? This condition may be diagnosed based on your medical history, a physical exam, and your symptoms. Your health care provider will check for:  A tender area on the bottom of your foot.  A high arch in your foot or flat feet.  Pain when you move your foot.  Difficulty moving your foot. You may have imaging tests to confirm the diagnosis, such as:  X-rays.  Ultrasound.  MRI. How is this treated? Treatment for plantar  fasciitis depends on how severe your condition is. Treatment may include:  Rest, ice, pressure (compression), and raising (elevating) the affected foot. This is called RICE therapy. Your health care provider may recommend RICE therapy along with over-the-counter pain medicines to manage your pain.  Exercises to stretch your calves and your plantar fascia.  A splint that holds your foot in a stretched, upward position while you sleep (night splint).  Physical therapy to relieve symptoms and prevent problems in the future.  Injections of steroid medicine (cortisone) to relieve pain and inflammation.  Stimulating your plantar fascia with electrical impulses (extracorporeal shock wave therapy). This is usually the last treatment option before surgery.  Surgery, if other treatments have not worked after 12 months. Follow these instructions at home: Managing pain, stiffness, and swelling  If directed, put ice on the painful area. To do this: ? Put ice in a plastic bag, or use a frozen bottle of water. ? Place a towel between your skin and the bag or bottle. ? Roll the bottom of your foot over the bag or bottle. ? Do this for 20 minutes, 2-3 times a day.  Wear athletic shoes that have air-sole or gel-sole cushions, or try soft shoe inserts that are designed for plantar fasciitis.  Elevate your foot above the level of your heart while you are sitting or lying down.   Activity  Avoid activities that cause pain. Ask your health care provider what activities are safe for you.  Do physical therapy exercises   and stretches as told by your health care provider.  Try activities and forms of exercise that are easier on your joints (low impact). Examples include swimming, water aerobics, and biking. General instructions  Take over-the-counter and prescription medicines only as told by your health care provider.  Wear a night splint while sleeping, if told by your health care provider. Loosen the  splint if your toes tingle, become numb, or turn cold and blue.  Maintain a healthy weight, or work with your health care provider to lose weight as needed.  Keep all follow-up visits. This is important. Contact a health care provider if you have:  Symptoms that do not go away with home treatment.  Pain that gets worse.  Pain that affects your ability to move or do daily activities. Summary  Plantar fasciitis is a painful foot condition that affects the heel. It occurs when the band of tissue that connects the toes to the heel bone (plantar fascia) becomes irritated.  Heel pain is the main symptom of this condition. It may get worse after exercising too much or standing still for a long time.  Treatment varies, but it usually starts with rest, ice, pressure (compression), and raising (elevating) the affected foot. This is called RICE therapy. Over-the-counter medicines can also be used to manage pain. This information is not intended to replace advice given to you by your health care provider. Make sure you discuss any questions you have with your health care provider. Document Revised: 01/05/2020 Document Reviewed: 01/05/2020 Elsevier Patient Education  2021 Elsevier Inc.  

## 2021-03-10 LAB — CMP14+EGFR
ALT: 19 IU/L (ref 0–32)
AST: 17 IU/L (ref 0–40)
Albumin/Globulin Ratio: 1.5 (ref 1.2–2.2)
Albumin: 4.8 g/dL (ref 3.8–4.8)
Alkaline Phosphatase: 162 IU/L — ABNORMAL HIGH (ref 44–121)
BUN/Creatinine Ratio: 31 — ABNORMAL HIGH (ref 9–23)
BUN: 28 mg/dL — ABNORMAL HIGH (ref 6–24)
Bilirubin Total: 0.2 mg/dL (ref 0.0–1.2)
CO2: 25 mmol/L (ref 20–29)
Calcium: 9.8 mg/dL (ref 8.7–10.2)
Chloride: 99 mmol/L (ref 96–106)
Creatinine, Ser: 0.89 mg/dL (ref 0.57–1.00)
Globulin, Total: 3.2 g/dL (ref 1.5–4.5)
Glucose: 202 mg/dL — ABNORMAL HIGH (ref 65–99)
Potassium: 3.9 mmol/L (ref 3.5–5.2)
Sodium: 139 mmol/L (ref 134–144)
Total Protein: 8 g/dL (ref 6.0–8.5)
eGFR: 80 mL/min/{1.73_m2} (ref >59)

## 2021-03-10 LAB — LIPID PANEL
Chol/HDL Ratio: 4.1 ratio (ref 0.0–4.4)
Cholesterol, Total: 169 mg/dL (ref 100–199)
HDL: 41 mg/dL (ref >39)
LDL Chol Calc (NIH): 106 mg/dL — ABNORMAL HIGH (ref 0–99)
Triglycerides: 121 mg/dL (ref 0–149)
VLDL Cholesterol Cal: 22 mg/dL (ref 5–40)

## 2021-03-10 LAB — MICROALBUMIN / CREATININE URINE RATIO
Creatinine, Urine: 35.1 mg/dL
Microalb/Creat Ratio: 1434 mg/g creat — ABNORMAL HIGH (ref 0–29)
Microalbumin, Urine: 503.4 ug/mL

## 2021-03-11 ENCOUNTER — Telehealth: Payer: Self-pay

## 2021-03-11 NOTE — Telephone Encounter (Signed)
Patient was called and a voicemail was left informing patient to return phone call for lab results. 

## 2021-03-11 NOTE — Telephone Encounter (Signed)
-----   Message from Charlott Rakes, MD sent at 03/11/2021  9:21 AM EDT ----- Please inform her that her LDL (bad cholesterol) has trended up, kidney function reveal spilling a lot of protein into her urine which can be as a result of diabetes not being fully controlled.  Please advise her to work on weight loss, diabetic diet and her medications and we will repeat labs at her next visit.  Thank you

## 2021-04-08 ENCOUNTER — Encounter: Payer: Self-pay | Admitting: Pharmacist

## 2021-04-08 ENCOUNTER — Ambulatory Visit: Payer: BC Managed Care – PPO | Attending: Family Medicine | Admitting: Pharmacist

## 2021-04-08 ENCOUNTER — Other Ambulatory Visit: Payer: Self-pay

## 2021-04-08 ENCOUNTER — Ambulatory Visit: Payer: BC Managed Care – PPO | Admitting: Podiatry

## 2021-04-08 DIAGNOSIS — E1169 Type 2 diabetes mellitus with other specified complication: Secondary | ICD-10-CM

## 2021-04-08 LAB — GLUCOSE, POCT (MANUAL RESULT ENTRY): POC Glucose: 156 mg/dl — AB (ref 70–99)

## 2021-04-08 NOTE — Progress Notes (Signed)
S:    PCP: Dr. Margarita Rana   No chief complaint on file.  Patient arrives in good spirits.  Presents for diabetes evaluation, education, and management. Patient was referred and last seen by Primary Care Provider on 03/09/2021. A1c at that visit 9.0%. Dr. Margarita Rana changed Victoza to Trulicity and referred pt to me for titration.   Patient reports Diabetes was diagnosed in 2009. Today, she says she was doing well with DM control until the onset of the covid-19 pandemic. She starting slacking on her diet and lifestyle, however, she is motivated to change since seeing Dr. Margarita Rana and starting Trulicity. She is tolerating the 1.5 mg weekly dose. Denies NV, abdominal pain. Denies any changes in her vision.   Family/Social History:  -Fhx: htn, hld, heart disease  -Tobacco: former smoker (quit in 2009) -Alcohol: denies use   Insurance coverage/medication affordability: BCBS  Medication adherence reported.   Current diabetes medications include: glipizide 10 mg - pt takes 20 mg BID; Farxiga 10 mg daily; Trulicity 1.5 mg weekly   Patient denies hypoglycemic events.  Patient reported dietary habits:  - Since her visit with Dr. Margarita Rana, pt has stopped drinking "zero" and "sugar-free" products  - She now only drinks water or green tea - Controls her portion sizes; denies snacking on junk or sugary foods   Patient-reported exercise habits:  - Endorses 30 minutes of exercise dailty   Patient denies nocturia (nighttime urination).  Patient denies neuropathy (nerve pain). Patient denies visual changes. Patient reports self foot exams.     O:  POCT: 154 (post-prandial)  Lab Results  Component Value Date   HGBA1C 9.0 (A) 03/09/2021   There were no vitals filed for this visit.  Lipid Panel     Component Value Date/Time   CHOL 169 03/09/2021 0915   TRIG 121 03/09/2021 0915   HDL 41 03/09/2021 0915   CHOLHDL 4.1 03/09/2021 0915   CHOLHDL 3.9 11/01/2016 1608   VLDL 18 06/16/2010 2054    LDLCALC 106 (H) 03/09/2021 0915   LDLDIRECT 131 (H) 04/21/2010 2016   *Does not have her meter today  Home fasting blood sugars: reports 80s-120s  2 hour post-meal/random blood sugars: denies any readings higher than the 952W since Trulicity 1.5mg  was started.  Clinical Atherosclerotic Cardiovascular Disease (ASCVD): No  The 10-year ASCVD risk score Mikey Bussing DC Jr., et al., 2013) is: 8.3%   Values used to calculate the score:     Age: 50 years     Sex: Female     Is Non-Hispanic African American: Yes     Diabetic: Yes     Tobacco smoker: No     Systolic Blood Pressure: 413 mmHg     Is BP treated: Yes     HDL Cholesterol: 41 mg/dL     Total Cholesterol: 169 mg/dL   A/P: Diabetes longstanding currently uncontrolled but reported home CBGs are at goal. Today's clinic CBG is okay considering it's post-prandial. Patient is able to verbalize appropriate hypoglycemia management plan. Medication adherence appears appropriate. She just picked up a new rx for 4 more weeks of Trulicity 1.5mg . Will have her finish this and return in 1 month for titration if necessary.  -Continued current regimen. -Extensively discussed pathophysiology of diabetes, recommended lifestyle interventions, dietary effects on blood sugar control -Counseled on s/sx of and management of hypoglycemia -Next A1C anticipated 06/2021.   Written patient instructions provided.  Total time in face to face counseling 15 minutes.   Follow up Pharmacist Clinic  Visit in 1 month.   Benard Halsted, PharmD, [facility], [place] [phone]

## 2021-05-05 DIAGNOSIS — G8929 Other chronic pain: Secondary | ICD-10-CM | POA: Diagnosis not present

## 2021-05-05 DIAGNOSIS — M25561 Pain in right knee: Secondary | ICD-10-CM | POA: Diagnosis not present

## 2021-05-05 DIAGNOSIS — M25562 Pain in left knee: Secondary | ICD-10-CM | POA: Diagnosis not present

## 2021-05-05 DIAGNOSIS — Z3200 Encounter for pregnancy test, result unknown: Secondary | ICD-10-CM | POA: Diagnosis not present

## 2021-05-09 ENCOUNTER — Ambulatory Visit: Payer: BC Managed Care – PPO | Admitting: Pharmacist

## 2021-05-10 ENCOUNTER — Other Ambulatory Visit: Payer: Self-pay | Admitting: Pharmacist

## 2021-05-10 ENCOUNTER — Ambulatory Visit: Payer: BC Managed Care – PPO | Admitting: Pharmacist

## 2021-05-10 DIAGNOSIS — E1169 Type 2 diabetes mellitus with other specified complication: Secondary | ICD-10-CM

## 2021-05-10 MED ORDER — TRULICITY 1.5 MG/0.5ML ~~LOC~~ SOAJ: 1.5000 mg | SUBCUTANEOUS | 2 refills | Status: DC

## 2021-05-19 DIAGNOSIS — Z6837 Body mass index (BMI) 37.0-37.9, adult: Secondary | ICD-10-CM | POA: Diagnosis not present

## 2021-05-19 DIAGNOSIS — M25561 Pain in right knee: Secondary | ICD-10-CM | POA: Diagnosis not present

## 2021-05-19 DIAGNOSIS — M25562 Pain in left knee: Secondary | ICD-10-CM | POA: Diagnosis not present

## 2021-05-19 DIAGNOSIS — G8929 Other chronic pain: Secondary | ICD-10-CM | POA: Diagnosis not present

## 2021-06-09 ENCOUNTER — Ambulatory Visit: Payer: BC Managed Care – PPO | Admitting: Pharmacist

## 2021-06-09 DIAGNOSIS — J029 Acute pharyngitis, unspecified: Secondary | ICD-10-CM | POA: Diagnosis not present

## 2021-06-09 DIAGNOSIS — J069 Acute upper respiratory infection, unspecified: Secondary | ICD-10-CM | POA: Diagnosis not present

## 2021-06-09 DIAGNOSIS — R059 Cough, unspecified: Secondary | ICD-10-CM | POA: Diagnosis not present

## 2021-06-18 DIAGNOSIS — B373 Candidiasis of vulva and vagina: Secondary | ICD-10-CM | POA: Diagnosis not present

## 2021-06-18 DIAGNOSIS — Z6837 Body mass index (BMI) 37.0-37.9, adult: Secondary | ICD-10-CM | POA: Diagnosis not present

## 2021-06-21 DIAGNOSIS — M1710 Unilateral primary osteoarthritis, unspecified knee: Secondary | ICD-10-CM | POA: Diagnosis not present

## 2021-06-21 DIAGNOSIS — M25562 Pain in left knee: Secondary | ICD-10-CM | POA: Diagnosis not present

## 2021-06-21 DIAGNOSIS — M7050 Other bursitis of knee, unspecified knee: Secondary | ICD-10-CM | POA: Diagnosis not present

## 2021-06-21 DIAGNOSIS — R29818 Other symptoms and signs involving the nervous system: Secondary | ICD-10-CM | POA: Diagnosis not present

## 2021-07-04 DIAGNOSIS — R030 Elevated blood-pressure reading, without diagnosis of hypertension: Secondary | ICD-10-CM | POA: Diagnosis not present

## 2021-07-04 DIAGNOSIS — E119 Type 2 diabetes mellitus without complications: Secondary | ICD-10-CM | POA: Diagnosis not present

## 2021-07-04 DIAGNOSIS — Z6837 Body mass index (BMI) 37.0-37.9, adult: Secondary | ICD-10-CM | POA: Diagnosis not present

## 2021-07-04 DIAGNOSIS — R400 Somnolence: Secondary | ICD-10-CM | POA: Diagnosis not present

## 2021-07-04 DIAGNOSIS — I10 Essential (primary) hypertension: Secondary | ICD-10-CM | POA: Diagnosis not present

## 2021-07-05 DIAGNOSIS — E119 Type 2 diabetes mellitus without complications: Secondary | ICD-10-CM | POA: Diagnosis not present

## 2021-07-05 DIAGNOSIS — R5383 Other fatigue: Secondary | ICD-10-CM | POA: Diagnosis not present

## 2021-07-05 DIAGNOSIS — E559 Vitamin D deficiency, unspecified: Secondary | ICD-10-CM | POA: Diagnosis not present

## 2021-07-05 DIAGNOSIS — Z0000 Encounter for general adult medical examination without abnormal findings: Secondary | ICD-10-CM | POA: Diagnosis not present

## 2021-07-05 DIAGNOSIS — Z87891 Personal history of nicotine dependence: Secondary | ICD-10-CM | POA: Diagnosis not present

## 2021-07-05 DIAGNOSIS — I10 Essential (primary) hypertension: Secondary | ICD-10-CM | POA: Diagnosis not present

## 2021-07-05 DIAGNOSIS — R0602 Shortness of breath: Secondary | ICD-10-CM | POA: Diagnosis not present

## 2021-07-19 ENCOUNTER — Ambulatory Visit: Payer: BC Managed Care – PPO | Admitting: Family Medicine

## 2021-08-04 DIAGNOSIS — I10 Essential (primary) hypertension: Secondary | ICD-10-CM | POA: Diagnosis not present

## 2021-08-04 DIAGNOSIS — Z712 Person consulting for explanation of examination or test findings: Secondary | ICD-10-CM | POA: Diagnosis not present

## 2021-08-04 DIAGNOSIS — Z6837 Body mass index (BMI) 37.0-37.9, adult: Secondary | ICD-10-CM | POA: Diagnosis not present

## 2021-08-04 DIAGNOSIS — E7800 Pure hypercholesterolemia, unspecified: Secondary | ICD-10-CM | POA: Diagnosis not present

## 2021-08-04 DIAGNOSIS — E1165 Type 2 diabetes mellitus with hyperglycemia: Secondary | ICD-10-CM | POA: Diagnosis not present

## 2021-09-13 DIAGNOSIS — I10 Essential (primary) hypertension: Secondary | ICD-10-CM | POA: Diagnosis not present

## 2021-09-13 DIAGNOSIS — Z1211 Encounter for screening for malignant neoplasm of colon: Secondary | ICD-10-CM | POA: Diagnosis not present

## 2021-09-13 DIAGNOSIS — E7800 Pure hypercholesterolemia, unspecified: Secondary | ICD-10-CM | POA: Diagnosis not present

## 2021-09-13 DIAGNOSIS — E1165 Type 2 diabetes mellitus with hyperglycemia: Secondary | ICD-10-CM | POA: Diagnosis not present

## 2022-10-30 ENCOUNTER — Encounter: Payer: Self-pay | Admitting: Physician Assistant

## 2022-10-30 ENCOUNTER — Ambulatory Visit: Payer: BC Managed Care – PPO | Admitting: Physician Assistant

## 2022-10-30 VITALS — BP 147/86 | HR 122 | Ht 66.0 in | Wt 222.0 lb

## 2022-10-30 DIAGNOSIS — J028 Acute pharyngitis due to other specified organisms: Secondary | ICD-10-CM | POA: Diagnosis not present

## 2022-10-30 DIAGNOSIS — B9689 Other specified bacterial agents as the cause of diseases classified elsewhere: Secondary | ICD-10-CM | POA: Diagnosis not present

## 2022-10-30 DIAGNOSIS — R000 Tachycardia, unspecified: Secondary | ICD-10-CM

## 2022-10-30 DIAGNOSIS — I10 Essential (primary) hypertension: Secondary | ICD-10-CM

## 2022-10-30 MED ORDER — PENICILLIN V POTASSIUM 500 MG PO TABS: 500.0000 mg | ORAL_TABLET | Freq: Three times a day (TID) | ORAL | 0 refills | Status: AC

## 2022-10-30 MED ORDER — ACETAMINOPHEN 325 MG PO TABS
750.0000 mg | ORAL_TABLET | Freq: Once | ORAL | Status: AC
  Administered 2022-10-30: 812.5 mg via ORAL

## 2022-10-30 NOTE — Progress Notes (Unsigned)
   Established Patient Office Visit  Subjective   Patient ID: Sheila Bishop, female    DOB: 1970/10/16  Age: 52 y.o. MRN: [id number]  Chief Complaint  Patient presents with   Sore Throat    Symptoms started throughout the weekend     Was getting hoarse  Hard time swallowing  No sick contacts   Worse yesterday   Hurts to drink -   2/17- next a1c Taken meds No abx  .e    {History (Optional):23778}  ROS    Objective:     BP (!) 147/86   Pulse (!) 122   Ht '5\' 6"'$  (1.676 m)   Wt 222 lb (100.7 kg)   SpO2 97%   BMI 35.83 kg/m  {Vitals History (Optional):23777}  Physical Exam   No results found for any visits on 10/30/22.  {Labs (Optional):23779}  The 10-year ASCVD risk score (Arnett DK, et al., 2019) is: 17%    Assessment & Plan:   Problem List Items Addressed This Visit   None   No follow-ups on file.    Loraine Grip Mayers, PA-C

## 2022-10-30 NOTE — Patient Instructions (Addendum)
You are going to take penicillin twice a day for the next 10 days.  You were given Tylenol in the clinic, you can we keep Tylenol dosing and 4 to 6 hours continue as needed.  Make sure that you are staying well-hydrated.  Your pulse is elevated, I encourage you to continue checking your blood pressure at home as well as your pulse rate.  We will call you with today's test results.  I hope that you feel better soon, please let us know if there is anything else we can do for you  Kennieth Rad, PA-C Physician Assistant [facility] Medicine [email or web address]   Pharyngitis  Pharyngitis is inflammation of the throat (pharynx). It is a very common cause of sore throat. Pharyngitis can be caused by a bacteria, but it is usually caused by a virus. Most cases of pharyngitis get better on their own without treatment. What are the causes? This condition may be caused by: Infection by viruses (viral). Viral pharyngitis spreads easily from person to person (is contagious) through coughing, sneezing, and sharing of personal items or utensils such as cups, forks, spoons, and toothbrushes. Infection by bacteria (bacterial). Bacterial pharyngitis may be spread by touching the nose or face after coming in contact with the bacteria, or through close contact, such as kissing. Allergies. Allergies can cause buildup of mucus in the throat (post-nasal drip), leading to inflammation and irritation. Allergies can also cause blocked nasal passages, forcing breathing through the mouth, which dries and irritates the throat. What increases the risk? You are more likely to develop this condition if: You are 34-61 years old. You are exposed to crowded environments such as daycare, school, or dormitory living. You live in a cold climate. You have a weakened disease-fighting (immune) system. What are the signs or symptoms? Symptoms of this condition vary by the cause.  Common symptoms of this condition include: Sore throat. Fatigue. Low-grade fever. Stuffy nose (nasal congestion) and cough. Headache. Other symptoms may include: Glands in the neck (lymph nodes) that are swollen. Skin rashes. Plaque-like film on the throat or tonsils. This is often a symptom of bacterial pharyngitis. Vomiting. Red, itchy eyes (conjunctivitis). Loss of appetite. Joint pain and muscle aches. Enlarged tonsils. How is this diagnosed? This condition may be diagnosed based on your medical history and a physical exam. Your health care provider will ask you questions about your illness and your symptoms. A swab of your throat may be done to check for bacteria (rapid strep test). Other lab tests may also be done, depending on the suspected cause, but these are rare. How is this treated? Many times, treatment is not needed for this condition. Pharyngitis usually gets better in 3-4 days without treatment. Bacterial pharyngitis may be treated with antibiotic medicines. Follow these instructions at home: Medicines Take over-the-counter and prescription medicines only as told by your health care provider. If you were prescribed an antibiotic medicine, take it as told by your health care provider. Do not stop taking the antibiotic even if you start to feel better. Use throat sprays to soothe your throat as told by your health care provider. Children can get pharyngitis. Do not give your child aspirin because of the association with Reye's syndrome. Managing pain To help with pain, try: Sipping warm liquids, such as broth, herbal tea, or warm water. Eating or drinking cold or frozen liquids, such as frozen ice pops. Gargling with a mixture of salt and water 3-4 times a day or as needed. To  make salt water, completely dissolve -1 tsp (3-6 g) of salt in 1 cup (237 mL) of warm water. Sucking on hard candy or throat lozenges. Putting a cool-mist humidifier in your bedroom at night to  moisten the air. Sitting in the bathroom with the door closed for 5-10 minutes while you run hot water in the shower.  General instructions  Do not use any products that contain nicotine or tobacco. These products include cigarettes, chewing tobacco, and vaping devices, such as e-cigarettes. If you need help quitting, ask your health care provider. Rest as told by your health care provider. Drink enough fluid to keep your urine pale yellow. How is this prevented? To help prevent becoming infected or spreading infection: Wash your hands often with soap and water for at least 20 seconds. If soap and water are not available, use hand sanitizer. Do not touch your eyes, nose, or mouth with unwashed hands, and wash hands after touching these areas. Do not share cups or eating utensils. Avoid close contact with people who are sick. Contact a health care provider if: You have large, tender lumps in your neck. You have a rash. You cough up green, yellow-brown, or bloody mucus. Get help right away if: Your neck becomes stiff. You drool or are unable to swallow liquids. You cannot drink or take medicines without vomiting. You have severe pain that does not go away, even after you take medicine. You have trouble breathing, and it is not caused by a stuffy nose. You have new pain and swelling in your joints such as the knees, ankles, wrists, or elbows. These symptoms may represent a serious problem that is an emergency. Do not wait to see if the symptoms will go away. Get medical help right away. Call your local emergency services (911 in the [place] Do not drive yourself to the hospital. Summary Pharyngitis is redness, pain, and swelling (inflammation) of the throat (pharynx). While pharyngitis can be caused by a bacteria, the most common causes are viral. Most cases of pharyngitis get better on their own without treatment. Bacterial pharyngitis is treated with antibiotic medicines. This  information is not intended to replace advice given to you by your health care provider. Make sure you discuss any questions you have with your health care provider. Document Revised: 12/15/2020 Document Reviewed: 12/15/2020 Elsevier Patient Education  [place].  Sinus Tachycardia  Sinus tachycardia is a fast heartbeat. In sinus tachycardia, the heart beats more than 100 times a minute. Sinus tachycardia starts in the part of the heart called the sinoatrial (SA) node. Sinus tachycardia may be harmless, or it may be a sign of a serious condition. What are the causes? This condition may be caused by: Exercise or exertion. A fever. Pain. Loss of body fluids (dehydration). Severe bleeding (hemorrhage). Anxiety and stress. Certain substances, including: Alcohol. Caffeine. Tobacco and nicotine products. Cold medicines. Illegal drugs. Medical conditions including: Heart disease. An infection. An overactive thyroid (hyperthyroidism). A lack of red blood cells (anemia). What are the signs or symptoms? Symptoms of this condition include: A feeling that the heart is beating fast or unevenly (palpitations). Suddenly noticing your heartbeat (cardiac awareness). Lightheadedness. Tiredness (fatigue). Shortness of breath. Chest pain. Nausea. Fainting. How is this diagnosed? This condition is diagnosed with: A physical exam. Tests or monitoring, such as: Blood tests. An electrocardiogram (ECG). This test measures the electrical activity of the heart. Ambulatory cardiac monitor. This records your heartbeats for 24 hours or more. You may be referred to  a heart specialist (cardiologist). How is this treated? Treatment for this condition depends on the cause. Treatment may involve: Treating the underlying condition. Taking new medicines or changing your current medicines as told by your health care provider. Making changes to your diet or lifestyle. Follow these instructions at  home: Lifestyle  Do not use any products that contain nicotine or tobacco. These products include cigarettes, chewing tobacco, and vaping devices, such as e-cigarettes. If you need help quitting, ask your health care provider. Do not use illegal drugs, such as cocaine. Learn relaxation methods to help you when you get stressed or anxious. These include deep breathing. Avoid caffeine or other stimulants, including herbal stimulants that are found in energy drinks. Alcohol use  Do not drink alcohol if: Your health care provider tells you not to drink. You are pregnant, may be pregnant, or are planning to become pregnant. If you drink alcohol: Limit how much you have to: 0-1 drink a day for women. 0-2 drinks a day for men. Know how much alcohol is in your drink. In the [place] one drink equals one 12 oz bottle of beer (355 mL), one 5 oz glass of wine (148 mL), or one 1 oz glass of hard liquor (44 mL). General instructions Drink enough fluids to keep your urine pale yellow. Take over-the-counter and prescription medicines only as told by your health care provider. Ask your health care provider about taking vitamins, herbs, and supplements. Contact a health care provider if: You have vomiting or diarrhea that does not go away. You have a fever. You have weakness or dizziness. You feel faint. Get help right away if: You have pain in your chest, upper arms, jaw, or neck. You have palpitations that do not go away. Summary In sinus tachycardia, the heart beats more than 100 times a minute. Sinus tachycardia may be harmless, or it may be a sign of a serious condition. Treatment for this condition depends on the cause or the underlying condition. Get help right away if you have pain in your chest, upper arms, jaw, or neck. This information is not intended to replace advice given to you by your health care provider. Make sure you discuss any questions you have with your health care  provider. Document Revised: 01/17/2022 Document Reviewed: 01/17/2022 Elsevier Patient Education  [place].

## 2022-10-31 ENCOUNTER — Encounter: Payer: Self-pay | Admitting: Physician Assistant

## 2022-10-31 LAB — POCT RAPID STREP A (OFFICE): Rapid Strep A Screen: NEGATIVE

## 2022-11-01 LAB — COVID-19, FLU A+B AND RSV
Influenza A, NAA: NOT DETECTED
Influenza B, NAA: NOT DETECTED
RSV, NAA: NOT DETECTED
SARS-CoV-2, NAA: DETECTED — AB

## 2023-03-04 ENCOUNTER — Encounter (HOSPITAL_COMMUNITY): Payer: Self-pay

## 2023-03-04 ENCOUNTER — Other Ambulatory Visit: Payer: Self-pay

## 2023-03-04 ENCOUNTER — Emergency Department (HOSPITAL_COMMUNITY)
Admission: EM | Admit: 2023-03-04 | Discharge: 2023-03-04 | Disposition: A | Discharge status: Home / Self Care | Payer: BC Managed Care – PPO | Attending: Emergency Medicine | Admitting: Emergency Medicine

## 2023-03-04 DIAGNOSIS — Z7984 Long term (current) use of oral hypoglycemic drugs: Secondary | ICD-10-CM | POA: Insufficient documentation

## 2023-03-04 DIAGNOSIS — E119 Type 2 diabetes mellitus without complications: Secondary | ICD-10-CM | POA: Insufficient documentation

## 2023-03-04 DIAGNOSIS — I10 Essential (primary) hypertension: Secondary | ICD-10-CM | POA: Insufficient documentation

## 2023-03-04 DIAGNOSIS — N3000 Acute cystitis without hematuria: Secondary | ICD-10-CM | POA: Diagnosis not present

## 2023-03-04 DIAGNOSIS — Z794 Long term (current) use of insulin: Secondary | ICD-10-CM | POA: Diagnosis not present

## 2023-03-04 DIAGNOSIS — Z79899 Other long term (current) drug therapy: Secondary | ICD-10-CM | POA: Insufficient documentation

## 2023-03-04 DIAGNOSIS — R300 Dysuria: Secondary | ICD-10-CM | POA: Diagnosis present

## 2023-03-04 LAB — WET PREP, GENITAL
Clue Cells Wet Prep HPF POC: NONE SEEN
Sperm: NONE SEEN
Trich, Wet Prep: NONE SEEN
WBC, Wet Prep HPF POC: <10 (ref <10)
Yeast Wet Prep HPF POC: NONE SEEN

## 2023-03-04 LAB — URINALYSIS, ROUTINE W REFLEX MICROSCOPIC
Bacteria, UA: NONE SEEN
Bilirubin Urine: NEGATIVE
Glucose, UA: >=500 mg/dL — AB
Hgb urine dipstick: NEGATIVE
Ketones, ur: NEGATIVE mg/dL
Leukocytes,Ua: NEGATIVE
Nitrite: POSITIVE — AB
Protein, ur: 100 mg/dL — AB
Specific Gravity, Urine: 1.015 (ref 1.005–1.030)
pH: 5 (ref 5.0–8.0)

## 2023-03-04 MED ORDER — SULFAMETHOXAZOLE-TRIMETHOPRIM 800-160 MG PO TABS: 1.0000 | ORAL_TABLET | Freq: Two times a day (BID) | ORAL | 0 refills | Status: AC

## 2023-03-04 NOTE — Discharge Instructions (Addendum)
Please follow-up with your GYN regarding recent symptoms and ER visit.  Today your urine came back positive for UTI which would explain your symptoms and you will need to check the MyChart app for your gonorrhea chlamydia results.  I prescribed Bactrim for UTI.  Please take as prescribed and if symptoms worsen please return to ER.  Today your blood pressure was also elevated in the emergency room at 190/96.  When you get home please take your blood pressure medications as prescribed as a long-term high blood pressure can result in damage to your brain, heart, kidneys.

## 2023-03-04 NOTE — ED Triage Notes (Signed)
Pt arrived POV from home c/o burning with urination and urgency but not being able to go. Pt did a virtual visit and they gave her stuff for a yeast infection but it is not better. Pt states her labia is swollen and red as well.

## 2023-03-04 NOTE — ED Provider Notes (Signed)
[place] EMERGENCY DEPARTMENT AT [facility] Provider Note   CSN: [id number] Arrival date & time: 03/04/23  8657     History  Chief Complaint  Patient presents with   Dysuria    Sheila Bishop is a 52 y.o. female history of diabetes presented with dysuria and urgency the past 4 days.  Patient dates that she did a virtual visit and was given Diflucan which did not help symptoms.  Patient has tried AZO over-the-counter which has not helped her symptoms either.  Patient states that she has not had any nausea vomiting, abdominal pain, vaginal bleeding, hematuria, flank pain, back pain, fevers, discharge, vaginal odor.  Patient states the outside of her vagina is becoming more more progressively red and swollen and that is becoming more more difficult for her to urinate due to the swelling.   Home Medications Prior to Admission medications   Medication Sig Start Date End Date Taking? Authorizing Provider  albuterol (VENTOLIN HFA) 108 (90 Base) MCG/ACT inhaler Inhale 2 puffs into the lungs every 6 (six) hours as needed for wheezing or shortness of breath. 12/24/19   Sharon Seller, Marzella Schlein, PA-C  amLODipine (NORVASC) 5 MG tablet Take 1 tablet (5 mg total) by mouth daily. 03/09/21   Hoy Register, MD  atorvastatin (LIPITOR) 40 MG tablet Take 1 tablet (40 mg total) by mouth daily. 03/09/21   Hoy Register, MD  benzonatate (TESSALON) 100 MG capsule Take 1-2 capsules (100-200 mg total) by mouth 3 (three) times daily as needed for cough. Patient not taking: Reported on 03/09/2021 12/07/20   Hoy Register, MD  Blood Glucose Monitoring Suppl (CONTOUR NEXT MONITOR) w/Device KIT Use to check blood sugar twice daily. E11.69 09/06/20   Hoy Register, MD  cetirizine (ZYRTEC ALLERGY) 10 MG tablet Take 1 tablet (10 mg total) by mouth daily. Patient not taking: No sig reported 10/13/20   Wallis Bamberg, PA-C  dapagliflozin propanediol (FARXIGA) 10 MG TABS tablet Take 1 tablet (10 mg total) by mouth daily  before breakfast. 03/09/21   Hoy Register, MD  diclofenac Sodium (VOLTAREN) 1 % GEL Apply 4 g topically 4 (four) times daily. 03/09/21   Hoy Register, MD  Dulaglutide (TRULICITY) 1.5 MG/0.5ML SOPN Inject 1.5 mg into the skin once a week. 05/10/21   Hoy Register, MD  fluconazole (DIFLUCAN) 150 MG tablet Take 1 tablet (150 mg total) by mouth daily. Patient not taking: No sig reported 12/25/19   Dahlia Byes A, NP  gabapentin (NEURONTIN) 300 MG capsule Take 1 capsule (300 mg total) by mouth at bedtime. 03/09/21   Hoy Register, MD  glipiZIDE (GLUCOTROL) 10 MG tablet Take 2 tablets (20 mg total) by mouth 2 (two) times daily before a meal. 03/09/21   Newlin, Enobong, MD  glucose blood (CONTOUR NEXT TEST) test strip Use to check blood sugar twice daily. E11.69 09/06/20   Hoy Register, MD  ibuprofen (ADVIL,MOTRIN) 800 MG tablet Take 1 tablet (800 mg total) by mouth 3 (three) times daily. Patient not taking: No sig reported 12/08/17   Garlon Hatchet, PA-C  Insulin Pen Needle (BD PEN NEEDLE NANO U/F) 32G X 4 MM MISC USE AS DIRECTED TO  INJECT  VICTOZA 12/24/19   McClung, Marylene Land M, PA-C  lisinopril-hydrochlorothiazide (ZESTORETIC) 20-12.5 MG tablet Take 2 tablets by mouth daily. 03/09/21   Hoy Register, MD  loperamide (IMODIUM A-D) 2 MG capsule 2 mg Q 6 hrs PRN Patient not taking: No sig reported 10/16/17   Marcine Matar, MD  meloxicam [facility])  15 MG tablet Take 15 mg by mouth daily. Patient not taking: No sig reported 09/19/19   [provider]  Microlet Lancets MISC Use to check blood sugar up to 3 times daily. 05/19/19   Hoy Register, MD  Microlet Lancets MISC Use to check blood sugar twice daily. E11.69 09/06/20   Hoy Register, MD  mupirocin cream (BACTROBAN) 2 % Apply 1 application topically 2 (two) times daily. Patient not taking: No sig reported 07/19/20   Mayers, Cari S, PA-C  neomycin-polymyxin-hydrocortisone (CORTISPORIN) OTIC solution Apply 2-3 drops to the ingrown toenail site  twice daily. Cover with band-aid. 06/10/19   Asencion Islam, DPM  nystatin-triamcinolone (MYCOLOG II) cream Apply to affected area daily Patient not taking: Reported on 03/09/2021 12/25/19   Dahlia Byes A, NP  ondansetron (ZOFRAN ODT) 4 MG disintegrating tablet Take 1 tablet (4 mg total) by mouth every 8 (eight) hours as needed for nausea or vomiting. Patient not taking: No sig reported 05/21/20   Dahlia Byes A, NP  promethazine-dextromethorphan (PROMETHAZINE-DM) 6.25-15 MG/5ML syrup Take 5 mLs by mouth at bedtime as needed for cough. Patient not taking: No sig reported 10/13/20   Wallis Bamberg, PA-C  sulfamethoxazole-trimethoprim (BACTRIM DS) 800-160 MG tablet Take 1 tablet by mouth 2 (two) times daily for 5 days. 03/04/23 03/09/23 Yes Oluwademilade Kellett, Beverly Gust, PA-C      Allergies    Metformin and related and Amoxicillin    Review of Systems   Review of Systems  Genitourinary:  Positive for dysuria.    Physical Exam Updated Vital Signs BP (!) 198/96 (BP Location: Right Arm)   Pulse 95   Temp 98 F (36.7 C)   Resp 18   Ht 5\' 6"  (1.676 m)   Wt 102.5 kg   SpO2 94%   BMI 36.48 kg/m  Physical Exam Exam conducted with a chaperone present.  Constitutional:      General: She is not in acute distress. Abdominal:     Palpations: Abdomen is soft.     Tenderness: There is no abdominal tenderness. There is no guarding or rebound.  Genitourinary:    Exam position: Lithotomy position.     Labia:        Right: No rash, tenderness, lesion or injury.        Left: No rash, lesion or injury.      Cervix: Normal.     Uterus: Normal.      Adnexa: Right adnexa normal and left adnexa normal.     Comments: Chaperone: Jasmine Ward, RN Tender when speculum was placed however no erythema or edema are noted on cervix, labia, vaginal canal White discharge was noted No bleeding No CMT Neurological:     Mental Status: She is alert.     ED Results / Procedures / Treatments   Labs (all labs ordered are listed,  but only abnormal results are displayed) Labs Reviewed  URINALYSIS, ROUTINE W REFLEX MICROSCOPIC - Abnormal; Notable for the following components:      Result Value   Color, Urine AMBER (*)    Glucose, UA >=500 (*)    Protein, ur 100 (*)    Nitrite POSITIVE (*)    All other components within normal limits  WET PREP, GENITAL  GC/CHLAMYDIA PROBE AMP ([place]) NOT AT [facility]    EKG None  Radiology No results found.  Procedures Procedures    Medications Ordered in ED Medications - No data to display  ED Course/ Medical Decision Making/ A&P  Medical Decision Making Amount and/or Complexity of Data Reviewed Labs: ordered.   Leanord Asal Fenderson 52 y.o. presented today for dysuria. Working DDx that I considered at this time includes, but not limited to, UTI, vulvovaginitis, STI, candidiasis, PID.  R/o DDx: Vulvovaginitis, STI, candidiasis, PID: These are considered less likely due to history of present illness and physical exam findings  Review of prior external notes: 10/30/2022 office visit  Unique Tests and My Interpretation:  UA: Nitrite positive Wet prep: Negative GC: Pending  Discussion with Independent Historian:  Husband  Discussion of Management of Tests: None  Risk: Medium: prescription drug management  Risk Stratification Score: None  Plan: Patient presented for dysuria. On exam patient was in no acute distress and was hypertensive at 198/96.  Patient states that she had not taken her blood pressure meds this morning which would explain why her blood pressure was elevated.  Denies seeing any weakness, chest pain, shortness of breath, headache, vision changes.  I spoke to the patient about the risks of having her blood pressure this high which would in clued damage to her brain, heart, kidneys, blood vessels and patient stated that when she is discharged she will take her blood pressure meds when she got home.  Patient's abdomen is  nontender to palpation and with a chaperone in the room a pelvic exam was conducted which did not show any abnormalities.  Patient did not have CMT signs of trauma, bleeding.  Patient's urine came back positive for nitrites.  Patient's wet mount came back negative.  At this time I suspect patient has UTI which would explain her symptoms.  Patient does have allergy to penicillin according to the patient and so Bactrim will be prescribed for UTI.  I strongly suggest the patient follow-up with her GYN to be reevaluated the next week to ensure symptoms are improving.  Patient was given return precautions. Patient stable for discharge at this time.  Patient verbalized understanding of plan.         Final Clinical Impression(s) / ED Diagnoses Final diagnoses:  Acute cystitis without hematuria    Rx / DC Orders ED Discharge Orders          Ordered    sulfamethoxazole-trimethoprim (BACTRIM DS) 800-160 MG tablet  2 times daily        03/04/23 0918              Netta Corrigan, PA-C 03/04/23 1610    Linwood Dibbles, MD 03/05/23 [id number]

## 2023-03-05 LAB — GC/CHLAMYDIA PROBE AMP (~~LOC~~) NOT AT ARMC
Chlamydia: NEGATIVE
Comment: NEGATIVE
Comment: NORMAL
Neisseria Gonorrhea: NEGATIVE

## 2023-03-10 ENCOUNTER — Telehealth: Payer: BC Managed Care – PPO | Admitting: Family

## 2023-03-10 DIAGNOSIS — R399 Unspecified symptoms and signs involving the genitourinary system: Secondary | ICD-10-CM | POA: Diagnosis not present

## 2023-03-11 ENCOUNTER — Encounter: Payer: Self-pay | Admitting: Family Medicine

## 2023-03-11 MED ORDER — NITROFURANTOIN MONOHYD MACRO 100 MG PO CAPS: 100.0000 mg | ORAL_CAPSULE | Freq: Two times a day (BID) | ORAL | 0 refills | Status: DC

## 2023-03-11 NOTE — Progress Notes (Signed)

## 2023-03-16 ENCOUNTER — Telehealth: Payer: BC Managed Care – PPO | Admitting: Physician Assistant

## 2023-03-16 DIAGNOSIS — N390 Urinary tract infection, site not specified: Secondary | ICD-10-CM

## 2023-03-16 NOTE — Progress Notes (Signed)
Because of continued symptoms despite multiple rounds of treatment, I feel your condition warrants further evaluation and I recommend that you be seen in a face to face visit.   NOTE: There will be NO CHARGE for this eVisit   If you are having a true medical emergency please call 911.      For an urgent face to face visit, [facility] has eight urgent care centers for your convenience:   NEW!! [facility] Health Urgent Care Center at [facility] Get Driving Directions [phone] [address], [address] [place], [address]    [facility] Health Urgent Care Center at [facility] Get Driving Directions [phone] [address] [place], [place] [address]   [facility] Health Urgent Care Center [facility]) Get Driving Directions [phone] [address] [place], [place] [address]  [facility] Health Urgent Care Center [facility]) Get Driving Directions [phone] [address] [place],  [place]  [address]  [facility] Health Urgent Care Center [facility] - at [employer]  [phone] [address] [place],  [place] [address]   [facility] Health Urgent Care at [facility] Get Driving Directions [phone] [address] [place], [place] [address]   [facility] Health Urgent Care at [facility] Get Driving Directions  [phone] [address] [place], [place] [address]   [facility] Health Urgent Care at [facility] Directions [phone] [address] [place], [place] [address]  Your MyChart E-visit questionnaire answers were reviewed by a board certified advanced clinical practitioner to complete your personal care plan based on your specific symptoms.  Thank you for using e-Visits.

## 2023-03-17 ENCOUNTER — Ambulatory Visit (HOSPITAL_COMMUNITY): Payer: BC Managed Care – PPO

## 2023-04-10 ENCOUNTER — Other Ambulatory Visit: Payer: Self-pay | Admitting: Family Medicine

## 2023-04-10 ENCOUNTER — Other Ambulatory Visit: Payer: Self-pay

## 2023-04-10 ENCOUNTER — Other Ambulatory Visit (HOSPITAL_COMMUNITY)
Admission: RE | Admit: 2023-04-10 | Discharge: 2023-04-10 | Disposition: A | Discharge status: Home / Self Care | Payer: 59 | Source: Ambulatory Visit | Attending: Family Medicine | Admitting: Family Medicine

## 2023-04-10 ENCOUNTER — Encounter: Payer: Self-pay | Admitting: Family Medicine

## 2023-04-10 ENCOUNTER — Telehealth: Payer: Self-pay | Admitting: Family Medicine

## 2023-04-10 ENCOUNTER — Ambulatory Visit: Payer: 59 | Attending: Family Medicine | Admitting: Family Medicine

## 2023-04-10 VITALS — BP 136/84 | HR 83 | Temp 98.2°F | Ht 66.0 in | Wt 218.2 lb

## 2023-04-10 DIAGNOSIS — E1149 Type 2 diabetes mellitus with other diabetic neurological complication: Secondary | ICD-10-CM

## 2023-04-10 DIAGNOSIS — N3000 Acute cystitis without hematuria: Secondary | ICD-10-CM | POA: Insufficient documentation

## 2023-04-10 DIAGNOSIS — E1169 Type 2 diabetes mellitus with other specified complication: Secondary | ICD-10-CM

## 2023-04-10 DIAGNOSIS — I152 Hypertension secondary to endocrine disorders: Secondary | ICD-10-CM

## 2023-04-10 DIAGNOSIS — E785 Hyperlipidemia, unspecified: Secondary | ICD-10-CM

## 2023-04-10 DIAGNOSIS — B3731 Acute candidiasis of vulva and vagina: Secondary | ICD-10-CM

## 2023-04-10 DIAGNOSIS — E1159 Type 2 diabetes mellitus with other circulatory complications: Secondary | ICD-10-CM

## 2023-04-10 DIAGNOSIS — Z1159 Encounter for screening for other viral diseases: Secondary | ICD-10-CM

## 2023-04-10 LAB — POCT URINALYSIS DIP (CLINITEK)
Bilirubin, UA: NEGATIVE
Glucose, UA: 500 mg/dL — AB
Ketones, POC UA: NEGATIVE mg/dL
Leukocytes, UA: NEGATIVE
Nitrite, UA: NEGATIVE
Spec Grav, UA: <=1.005 — AB (ref 1.010–1.025)
Urobilinogen, UA: 0.2 E.U./dL
pH, UA: 5.5 (ref 5.0–8.0)

## 2023-04-10 LAB — GLUCOSE, POCT (MANUAL RESULT ENTRY)
POC Glucose: 418 mg/dl — AB (ref 70–99)
POC Glucose: 405 mg/dl — AB (ref 70–99)

## 2023-04-10 LAB — POCT GLYCOSYLATED HEMOGLOBIN (HGB A1C): HbA1c, POC (controlled diabetic range): 14.3 % — AB (ref 0.0–7.0)

## 2023-04-10 MED ORDER — BD PEN NEEDLE NANO U/F 32G X 4 MM MISC
3 refills | Status: DC
Start: 2023-04-10 — End: 2023-05-29

## 2023-04-10 MED ORDER — ATORVASTATIN CALCIUM 40 MG PO TABS
40.0000 mg | ORAL_TABLET | Freq: Every day | ORAL | 1 refills | Status: DC
Start: 2023-04-10 — End: 2023-04-12
  Filled 2023-04-10: qty 90, 90d supply, fill #0

## 2023-04-10 MED ORDER — AMLODIPINE BESYLATE 5 MG PO TABS
5.0000 mg | ORAL_TABLET | Freq: Every day | ORAL | 1 refills | Status: DC
Start: 2023-04-10 — End: 2023-09-04
  Filled 2023-04-10: qty 90, 90d supply, fill #0
  Filled 2023-07-05: qty 90, 90d supply, fill #1

## 2023-04-10 MED ORDER — DAPAGLIFLOZIN PROPANEDIOL 10 MG PO TABS
10.0000 mg | ORAL_TABLET | Freq: Every day | ORAL | 1 refills | Status: DC
Start: 2023-04-10 — End: 2023-09-04
  Filled 2023-04-10: qty 90, 90d supply, fill #0
  Filled 2023-07-05: qty 90, 90d supply, fill #1

## 2023-04-10 MED ORDER — FREESTYLE LIBRE 2 READER DEVI
0 refills | Status: DC
Start: 2023-04-10 — End: 2023-05-02
  Filled 2023-04-10: qty 1, 1d supply, fill #0
  Filled 2023-05-02: qty 1, 90d supply, fill #0

## 2023-04-10 MED ORDER — FLUCONAZOLE 150 MG PO TABS
150.0000 mg | ORAL_TABLET | Freq: Every day | ORAL | 0 refills | Status: DC
  Filled 2023-04-10: qty 7, 7d supply, fill #0

## 2023-04-10 MED ORDER — TELMISARTAN 80 MG PO TABS
80.0000 mg | ORAL_TABLET | Freq: Every day | ORAL | 1 refills | Status: DC
  Filled 2023-04-10: qty 90, 90d supply, fill #0
  Filled 2023-07-05: qty 90, 90d supply, fill #1

## 2023-04-10 MED ORDER — INSULIN ASPART 100 UNIT/ML IJ SOLN
15.00 [IU] | Freq: Once | INTRAMUSCULAR | Status: AC
Start: 2023-04-10 — End: 2023-04-10
  Administered 2023-04-10: 15 [IU] via SUBCUTANEOUS

## 2023-04-10 MED ORDER — TIRZEPATIDE 10 MG/0.5ML ~~LOC~~ SOAJ
10.0000 mg | SUBCUTANEOUS | 0 refills | Status: DC
  Filled 2023-04-10 – 2023-05-02 (×2): qty 6, 84d supply, fill #0
  Filled 2023-05-29: qty 2, 28d supply, fill #0
  Filled 2023-06-27: qty 2, 28d supply, fill #1

## 2023-04-10 MED ORDER — LANTUS SOLOSTAR 100 UNIT/ML ~~LOC~~ SOPN
10.0000 [IU] | PEN_INJECTOR | Freq: Every day | SUBCUTANEOUS | 3 refills | Status: DC
  Filled 2023-04-10: qty 9, 84d supply, fill #0
  Filled 2023-06-27: qty 9, 84d supply, fill #1

## 2023-04-10 MED ORDER — TIRZEPATIDE 7.5 MG/0.5ML ~~LOC~~ SOAJ
7.5000 mg | SUBCUTANEOUS | 0 refills | Status: DC
  Filled 2023-04-10: qty 6, 84d supply, fill #0
  Filled 2023-05-02: qty 2, 28d supply, fill #0

## 2023-04-10 MED ORDER — GABAPENTIN 300 MG PO CAPS
300.0000 mg | ORAL_CAPSULE | Freq: Every day | ORAL | 1 refills | Status: DC
Start: 2023-04-10 — End: 2023-09-04
  Filled 2023-04-10: qty 90, 90d supply, fill #0
  Filled 2023-07-05: qty 90, 90d supply, fill #1

## 2023-04-10 MED ORDER — TIRZEPATIDE 5 MG/0.5ML ~~LOC~~ SOAJ
5.0000 mg | SUBCUTANEOUS | 0 refills | Status: DC
  Filled 2023-04-10: qty 2, 28d supply, fill #0

## 2023-04-10 MED ORDER — FREESTYLE LIBRE 2 SENSOR MISC
6 refills | Status: DC
  Filled 2023-04-10: qty 2, 28d supply, fill #0
  Filled 2023-05-02: qty 2, fill #0

## 2023-04-10 NOTE — Addendum Note (Signed)
Addended by: Lois Huxley, Jeannett Senior L on: 04/10/2023 12:07 PM   Modules accepted: Orders

## 2023-04-10 NOTE — Telephone Encounter (Signed)
Yes ma'am, rxn sent. I will note, when I ordered this via Epic a message showed coverage exclusion for CGM materials. Sometimes this can be inaccurate but there is a chance these are not covered.

## 2023-04-10 NOTE — Progress Notes (Signed)
Subjective:  Patient ID: Sheila Bishop, female    DOB: 12/04/1970  Age: 52 y.o. MRN: [id number]  CC: Diabetes   HPI Sheila Bishop is a 52 y.o. year old female with a history of type 2 diabetes mellitus (A1c 14.3), hypertension, hyperlipidemia who presents today for a follow-up visit.  Last seen in the clinic 2 years ago.  States she had been out of [place] for about 2 years.  Interval History: Discussed the use of AI scribe software for clinical note transcription with the patient, who gave verbal consent to proceed.  She presents with recurrent urinary tract infections and yeast infections. The patient reports persistent symptoms despite treatment with two different antibiotics (Bactrim and Macrobid within the space of 4 weeks) including burning during urination and itching. The patient also experiences frequent urination, particularly at night, which is impacting her ability to work.  She has sharp pain in her vagina but denies presence of vaginal discharge.  The patient's diabetes is currently poorly controlled, with an A1c of 14.3. The patient is currently only taking [place] for diabetes management and has been off Ozempic for a month which was prescribed by another clinician out of town.   The patient also has a history of hypertension and is currently taking amlodipine. The patient expresses interest in weight loss and is considering Mounjaro.        Past Medical History:  Diagnosis Date   Breast abscess Nov 2012   left   Depression 2007   Diabetes mellitus 2009   Headache(784.0)    Hypertension 2009   Obesity    Wears glasses     Past Surgical History:  Procedure Laterality Date   BREAST BIOPSY  08/07/2011   Procedure: BREAST BIOPSY;  Surgeon: Maisie Fus A. Cornett, MD;  Location: [facility] OR;  Service: General;  Laterality: Left;  BIOPSY BREAST LEFT SIDE   CESAREAN SECTION  2002    Family History  Problem Relation Age of Onset   Hyperlipidemia Mother     Hypertension Mother    Heart disease Mother     Social History   Socioeconomic History   Marital status: Significant Other    Spouse name: Not on file   Number of children: Not on file   Years of education: Not on file   Highest education level: 10th grade  Occupational History   Not on file  Tobacco Use   Smoking status: Former    Types: Cigarettes    Quit date: 12/31/2007    Years since quitting: 15.2   Smokeless tobacco: Never  Substance and Sexual Activity   Alcohol use: No   Drug use: No   Sexual activity: Not Currently  Other Topics Concern   Not on file  Social History Narrative   Not on file   Social Determinants of Health   Financial Resource Strain: Low Risk  (04/06/2023)   Overall Financial Resource Strain (CARDIA)    Difficulty of Paying Living Expenses: Not hard at all  Food Insecurity: Patient Declined (04/06/2023)   Hunger Vital Sign    Worried About Running Out of Food in the Last Year: Patient declined    Ran Out of Food in the Last Year: Patient declined  Transportation Needs: Unmet Transportation Needs (04/06/2023)   PRAPARE - Administrator, Civil Service (Medical): Yes    Lack of Transportation (Non-Medical): Yes  Physical Activity: Insufficiently Active (04/06/2023)   Exercise Vital Sign    Days of Exercise per Week:  3 days    Minutes of Exercise per Session: 30 min  Stress: No Stress Concern Present (04/06/2023)   [employer] of Occupational Health - Occupational Stress Questionnaire    Feeling of Stress : Not at all  Social Connections: Unknown (04/06/2023)   Social Connection and Isolation Panel [NHANES]    Frequency of Communication with Friends and Family: Three times a week    Frequency of Social Gatherings with Friends and Family: Patient declined    Attends Religious Services: Patient declined    Database administrator or Organizations: No    Attends Engineer, structural: Not on file    Marital Status: Patient declined     Allergies  Allergen Reactions   Metformin And Related Nausea Only   Amoxicillin Itching and Rash    Outpatient Medications Prior to Visit  Medication Sig Dispense Refill   albuterol (VENTOLIN HFA) 108 (90 Base) MCG/ACT inhaler Inhale 2 puffs into the lungs every 6 (six) hours as needed for wheezing or shortness of breath. 18 g 1   Blood Glucose Monitoring Suppl (CONTOUR NEXT MONITOR) w/Device KIT Use to check blood sugar twice daily. E11.69 1 kit 0   glucose blood (CONTOUR NEXT TEST) test strip Use to check blood sugar twice daily. E11.69 100 each 6   Insulin Pen Needle (BD PEN NEEDLE NANO U/F) 32G X 4 MM MISC USE AS DIRECTED TO  INJECT  VICTOZA 100 each 4   meloxicam (MOBIC) 15 MG tablet Take 15 mg by mouth daily.     Microlet Lancets MISC Use to check blood sugar up to 3 times daily. 100 each 11   Microlet Lancets MISC Use to check blood sugar twice daily. E11.69 100 each 6   amLODipine (NORVASC) 5 MG tablet Take 1 tablet (5 mg total) by mouth daily. 30 tablet 6   atorvastatin (LIPITOR) 40 MG tablet Take 1 tablet (40 mg total) by mouth daily. 30 tablet 6   dapagliflozin propanediol (FARXIGA) 10 MG TABS tablet Take 1 tablet (10 mg total) by mouth daily before breakfast. 30 tablet 6   Dulaglutide (TRULICITY) 1.5 MG/0.5ML SOPN Inject 1.5 mg into the skin once a week. 2 mL 2   gabapentin (NEURONTIN) 300 MG capsule Take 1 capsule (300 mg total) by mouth at bedtime. 30 capsule 6   glipiZIDE (GLUCOTROL) 10 MG tablet Take 2 tablets (20 mg total) by mouth 2 (two) times daily before a meal. 120 tablet 6   ibuprofen (ADVIL,MOTRIN) 800 MG tablet Take 1 tablet (800 mg total) by mouth 3 (three) times daily. 21 tablet 0   lisinopril-hydrochlorothiazide (ZESTORETIC) 20-12.5 MG tablet Take 2 tablets by mouth daily. 60 tablet 6   benzonatate (TESSALON) 100 MG capsule Take 1-2 capsules (100-200 mg total) by mouth 3 (three) times daily as needed for cough. (Patient not taking: Reported on 03/09/2021) 30  capsule 0   cetirizine (ZYRTEC ALLERGY) 10 MG tablet Take 1 tablet (10 mg total) by mouth daily. (Patient not taking: Reported on 12/07/2020) 30 tablet 0   diclofenac Sodium (VOLTAREN) 1 % GEL Apply 4 g topically 4 (four) times daily. (Patient not taking: Reported on 04/10/2023) 100 g 1   fluconazole (DIFLUCAN) 150 MG tablet Take 1 tablet (150 mg total) by mouth daily. (Patient not taking: Reported on 12/07/2020) 2 tablet 0   loperamide (IMODIUM A-D) 2 MG capsule 2 mg Q 6 hrs PRN (Patient not taking: Reported on 12/07/2020) 12 capsule 0   neomycin-polymyxin-hydrocortisone (CORTISPORIN) OTIC solution Apply  2-3 drops to the ingrown toenail site twice daily. Cover with band-aid. (Patient not taking: Reported on 04/10/2023) 10 mL 0   nitrofurantoin, macrocrystal-monohydrate, (MACROBID) 100 MG capsule Take 1 capsule (100 mg total) by mouth 2 (two) times daily. (Patient not taking: Reported on 04/10/2023) 14 capsule 0   promethazine-dextromethorphan (PROMETHAZINE-DM) 6.25-15 MG/5ML syrup Take 5 mLs by mouth at bedtime as needed for cough. (Patient not taking: Reported on 12/07/2020) 100 mL 0   No facility-administered medications prior to visit.     ROS Review of Systems  Constitutional:  Negative for activity change and appetite change.  HENT:  Negative for sinus pressure and sore throat.   Respiratory:  Negative for chest tightness, shortness of breath and wheezing.   Cardiovascular:  Negative for chest pain and palpitations.  Gastrointestinal:  Negative for abdominal distention, abdominal pain and constipation.  Genitourinary:  Positive for pelvic pain, urgency and vaginal pain.  Musculoskeletal: Negative.   Psychiatric/Behavioral:  Negative for behavioral problems and dysphoric mood.     Objective:  BP 136/84   Pulse 83   Temp 98.2 F (36.8 C) (Oral)   Ht 5\' 6"  (1.676 m)   Wt 218 lb 3.2 oz (99 kg)   SpO2 98%   BMI 35.22 kg/m      04/10/2023   10:10 AM 03/04/2023    9:35 AM 03/04/2023    7:25 AM   BP/Weight  Systolic BP 136 162   Diastolic BP 84 92   Wt. (Lbs) 218.2  226  BMI 35.22 kg/m2  36.48 kg/m2      Physical Exam Constitutional:      Appearance: She is well-developed.  Cardiovascular:     Rate and Rhythm: Normal rate.     Heart sounds: Normal heart sounds. No murmur heard. Pulmonary:     Effort: Pulmonary effort is normal.     Breath sounds: Normal breath sounds. No wheezing or rales.  Chest:     Chest wall: No tenderness.  Abdominal:     General: Bowel sounds are normal. There is no distension.     Palpations: Abdomen is soft. There is no mass.     Tenderness: There is abdominal tenderness (lower abdominal pain).  Musculoskeletal:        General: Normal range of motion.     Right lower leg: No edema.     Left lower leg: No edema.  Neurological:     Mental Status: She is alert and oriented to person, place, and time.  Psychiatric:        Mood and Affect: Mood normal.        Latest Ref Rng & Units 03/09/2021    9:15 AM 09/06/2020    4:11 PM 12/24/2019    4:18 PM  CMP  Glucose 65 - 99 mg/dL 098  119  97   BUN 6 - 24 mg/dL 28  28  18    Creatinine 0.57 - 1.00 mg/dL 1.47  8.29  5.62   Sodium 134 - 144 mmol/L 139  141  141   Potassium 3.5 - 5.2 mmol/L 3.9  3.8  3.8   Chloride 96 - 106 mmol/L 99  101  104   CO2 20 - 29 mmol/L 25  25  23    Calcium 8.7 - 10.2 mg/dL 9.8  9.7  9.2   Total Protein 6.0 - 8.5 g/dL 8.0   6.5   Total Bilirubin 0.0 - 1.2 mg/dL 0.2   <1.3   Alkaline Phos 44 -  121 IU/L 162   114   AST 0 - 40 IU/L 17   13   ALT 0 - 32 IU/L 19   12     Lipid Panel     Component Value Date/Time   CHOL 169 03/09/2021 0915   TRIG 121 03/09/2021 0915   HDL 41 03/09/2021 0915   CHOLHDL 4.1 03/09/2021 0915   CHOLHDL 3.9 11/01/2016 1608   VLDL 18 06/16/2010 2054   LDLCALC 106 (H) 03/09/2021 0915   LDLDIRECT 131 (H) 04/21/2010 2016    CBC    Component Value Date/Time   WBC 13.4 (H) 12/24/2019 1618   WBC 14.6 (H) 12/08/2017 2135   RBC 4.11  12/24/2019 1618   RBC 3.91 12/08/2017 2135   HGB 10.4 (L) 12/24/2019 1618   HCT 31.7 (L) 12/24/2019 1618   PLT 297 12/24/2019 1618   MCV 77 (L) 12/24/2019 1618   MCH 25.3 (L) 12/24/2019 1618   MCH 26.1 12/08/2017 2135   MCHC 32.8 12/24/2019 1618   MCHC 33.1 12/08/2017 2135   RDW 14.3 12/24/2019 1618   LYMPHSABS 3.2 (H) 12/24/2019 1618   MONOABS 610 02/23/2016 1052   EOSABS 0.4 12/24/2019 1618   BASOSABS 0.1 12/24/2019 1618    Lab Results  Component Value Date   HGBA1C 14.3 (A) 04/10/2023    Assessment & Plan:      Recurrent Urinary Tract Infections:  Persistent symptoms of dysuria and urgency despite two courses of antibiotics. No urine culture previously obtained. -Obtain urine culture and sensitivity today to identify causative organism and appropriate antibiotic. -Advise over-the-counter AZO (phenazopyridine) for symptomatic relief until culture results are available.  Vulvovaginal Candidiasis:  -Hyperglycemic state likely responsible.   -Reports severe itching and burning. Recent use of Diflucan without improvement. -Prescribe antifungal medication for presumed yeast infection, to be taken once daily for one week.  Poorly Controlled Diabetes Mellitus:  A1c significantly elevated at 14.3, up from 9.0. Currently only on Farxiga. She is concerned about her ability to afford Lantus -I have spoken with the clinical pharmacist and verified Lantus will cost $25 out-of-pocket per month with her insurance and she is agreeable to this We have also checked that Greggory Keen is covered by her insurance -Start Lantus insulin at 10 units at bedtime. -Add Mounjaro for additional glucose control and potential weight loss benefits; will titrate up to maximum tolerated dose -Check blood glucose at home and maintain a log. -Administer 15 units of Novolog today for immediate glucose control due to high blood sugar of 418. -Patient observed for 30 minutes and blood sugar repeated returned at  405 -Plan to follow up in one month to assess response to new regimen.  Hypertension:  -Controlled. -Currently on amlodipine. -Continue amlodipine as prescribed.  Hyperlipidemia:  -Currently on atorvastatin 40mg . -Continue atorvastatin as prescribed.  Work Status: Patient experiencing significant discomfort and frequent urination interfering with work. -Provide work excuse for the remainder of the week, with return to work planned for Monday.  General Health Maintenance: -Encourage continued participation in annual eye exams, as patient has been doing. -Advise on low-sugar cranberry juice for urinary health. -Plan to check kidney function and cholesterol levels with blood work today.           Meds ordered this encounter  Medications   fluconazole (DIFLUCAN) 150 MG tablet    Sig: Take 1 tablet (150 mg total) by mouth daily.    Dispense:  7 tablet    Refill:  0   telmisartan (MICARDIS)  80 MG tablet    Sig: Take 1 tablet (80 mg total) by mouth daily.    Dispense:  90 tablet    Refill:  1   insulin aspart (novoLOG) injection 15 Units   insulin glargine (LANTUS SOLOSTAR) 100 UNIT/ML Solostar Pen    Sig: Inject 10 Units into the skin daily.    Dispense:  30 mL    Refill:  3   tirzepatide (MOUNJARO) 7.5 MG/0.5ML Pen    Sig: Inject 7.5 mg into the skin once a week. For 4 weeks then increase to 10 mg once a week    Dispense:  6 mL    Refill:  0   tirzepatide (MOUNJARO) 10 MG/0.5ML Pen    Sig: Inject 10 mg into the skin once a week. For 4 weeks then increase to 12.5 mg once a week    Dispense:  6 mL    Refill:  0   tirzepatide (MOUNJARO) 5 MG/0.5ML Pen    Sig: Inject 5 mg into the skin once a week. For 4 weeks then increase to 7.5 mg once a week    Dispense:  6 mL    Refill:  0   amLODipine (NORVASC) 5 MG tablet    Sig: Take 1 tablet (5 mg total) by mouth daily.    Dispense:  90 tablet    Refill:  1   atorvastatin (LIPITOR) 40 MG tablet    Sig: Take 1 tablet (40 mg  total) by mouth daily.    Dispense:  90 tablet    Refill:  1   dapagliflozin propanediol (FARXIGA) 10 MG TABS tablet    Sig: Take 1 tablet (10 mg total) by mouth daily before breakfast.    Dispense:  90 tablet    Refill:  1    Dose increase   gabapentin (NEURONTIN) 300 MG capsule    Sig: Take 1 capsule (300 mg total) by mouth at bedtime.    Dispense:  90 capsule    Refill:  1    Follow-up: Return in about 1 month (around 05/11/2023) for Diabetes follow-up, ok to double book.    Visit required 49 minutes of patient care including median intraservice time, reviewing previous notes and test results, coordination of care, counseling the patient in addition to management of chronic medical conditions.Time also spent ordering medications, investigations and documenting in the chart.  All questions were answered to the patient's satisfaction    Hoy Register, MD, FAAFP. [facility] [place], [place] [phone]   04/10/2023, 12:53 PM

## 2023-04-10 NOTE — Progress Notes (Signed)
Burning urination, finished antibiotics and still having symptoms.  Vaginal pain.

## 2023-04-10 NOTE — Telephone Encounter (Signed)
Can you please send a prescription for CGM covered by her insurance to the pharmacy?  Thank you.

## 2023-04-11 LAB — CMP14+EGFR
ALT: 21 IU/L (ref 0–32)
AST: 16 IU/L (ref 0–40)
Albumin: 4.3 g/dL (ref 3.9–4.9)
Alkaline Phosphatase: 237 IU/L — ABNORMAL HIGH (ref 44–121)
BUN/Creatinine Ratio: 30 — ABNORMAL HIGH (ref 9–23)
BUN: 31 mg/dL — ABNORMAL HIGH (ref 6–24)
Bilirubin Total: 0.4 mg/dL (ref 0.0–1.2)
CO2: 27 mmol/L (ref 20–29)
Calcium: 10.4 mg/dL — ABNORMAL HIGH (ref 8.7–10.2)
Chloride: 92 mmol/L — ABNORMAL LOW (ref 96–106)
Creatinine, Ser: 1.05 mg/dL — ABNORMAL HIGH (ref 0.57–1.00)
Globulin, Total: 3.5 g/dL (ref 1.5–4.5)
Glucose: 406 mg/dL — ABNORMAL HIGH (ref 70–99)
Potassium: 4.1 mmol/L (ref 3.5–5.2)
Sodium: 133 mmol/L — ABNORMAL LOW (ref 134–144)
Total Protein: 7.8 g/dL (ref 6.0–8.5)
eGFR: 65 mL/min/{1.73_m2} (ref >59)

## 2023-04-11 LAB — CERVICOVAGINAL ANCILLARY ONLY
Bacterial Vaginitis (gardnerella): NEGATIVE
Candida Glabrata: NEGATIVE
Candida Vaginitis: POSITIVE — AB
Chlamydia: NEGATIVE
Comment: NEGATIVE
Comment: NEGATIVE
Comment: NEGATIVE
Comment: NEGATIVE
Comment: NEGATIVE
Comment: NORMAL
Neisseria Gonorrhea: NEGATIVE
Trichomonas: NEGATIVE

## 2023-04-11 LAB — MICROALBUMIN / CREATININE URINE RATIO
Creatinine, Urine: 24.3 mg/dL
Microalb/Creat Ratio: 294 mg/g creat — ABNORMAL HIGH (ref 0–29)
Microalbumin, Urine: 71.4 ug/mL

## 2023-04-11 LAB — LP+NON-HDL CHOLESTEROL
Cholesterol, Total: 260 mg/dL — ABNORMAL HIGH (ref 100–199)
HDL: 40 mg/dL (ref >39)
LDL Chol Calc (NIH): 153 mg/dL — ABNORMAL HIGH (ref 0–99)
Total Non-HDL-Chol (LDL+VLDL): 220 mg/dL — ABNORMAL HIGH (ref 0–129)
Triglycerides: 360 mg/dL — ABNORMAL HIGH (ref 0–149)
VLDL Cholesterol Cal: 67 mg/dL — ABNORMAL HIGH (ref 5–40)

## 2023-04-11 LAB — HCV INTERPRETATION

## 2023-04-11 LAB — HCV AB W REFLEX TO QUANT PCR: HCV Ab: NONREACTIVE

## 2023-04-12 ENCOUNTER — Other Ambulatory Visit: Payer: Self-pay | Admitting: Family Medicine

## 2023-04-12 DIAGNOSIS — E1169 Type 2 diabetes mellitus with other specified complication: Secondary | ICD-10-CM

## 2023-04-12 LAB — URINE CULTURE

## 2023-04-12 MED ORDER — FOSFOMYCIN TROMETHAMINE 3 G PO PACK: 3.0000 g | PACK | Freq: Once | ORAL | 0 refills | Status: AC

## 2023-04-12 MED ORDER — ATORVASTATIN CALCIUM 80 MG PO TABS: 80.0000 mg | ORAL_TABLET | Freq: Every day | ORAL | 1 refills | Status: DC

## 2023-04-16 ENCOUNTER — Other Ambulatory Visit: Payer: Self-pay

## 2023-05-02 ENCOUNTER — Other Ambulatory Visit: Payer: Self-pay | Admitting: Pharmacist

## 2023-05-02 ENCOUNTER — Other Ambulatory Visit: Payer: Self-pay

## 2023-05-02 DIAGNOSIS — E1169 Type 2 diabetes mellitus with other specified complication: Secondary | ICD-10-CM

## 2023-05-02 MED ORDER — DEXCOM G7 SENSOR MISC
6 refills | Status: DC
Start: 2023-05-02 — End: 2023-11-24
  Filled 2023-05-02 (×3): qty 3, 30d supply, fill #0
  Filled 2023-05-29: qty 3, 30d supply, fill #1
  Filled 2023-06-27: qty 3, 30d supply, fill #2
  Filled 2023-07-25: qty 3, 30d supply, fill #3
  Filled 2023-08-26: qty 3, 30d supply, fill #4
  Filled 2023-09-25: qty 3, 30d supply, fill #5
  Filled 2023-10-25 – 2023-10-26 (×3): qty 3, 30d supply, fill #6

## 2023-05-02 MED ORDER — DEXCOM G7 RECEIVER DEVI
0 refills | Status: AC
Start: 2023-05-02 — End: ?
  Filled 2023-05-02: qty 1, 90d supply, fill #0

## 2023-05-07 ENCOUNTER — Other Ambulatory Visit: Payer: Self-pay

## 2023-05-16 ENCOUNTER — Encounter: Payer: Self-pay | Admitting: Family Medicine

## 2023-05-29 ENCOUNTER — Other Ambulatory Visit: Payer: Self-pay | Admitting: Pharmacist

## 2023-05-29 ENCOUNTER — Other Ambulatory Visit: Payer: Self-pay

## 2023-05-29 DIAGNOSIS — E1169 Type 2 diabetes mellitus with other specified complication: Secondary | ICD-10-CM

## 2023-05-29 MED ORDER — BD PEN NEEDLE NANO U/F 32G X 4 MM MISC
3 refills | Status: AC
  Filled 2023-05-29: qty 100, 25d supply, fill #0
  Filled 2023-08-19: qty 100, 25d supply, fill #1
  Filled 2023-10-02 – 2023-11-15 (×7): qty 100, 25d supply, fill #2
  Filled 2023-11-15: qty 100, 90d supply, fill #0
  Filled 2024-02-10 – 2024-03-01 (×2): qty 100, 90d supply, fill #1

## 2023-05-30 ENCOUNTER — Other Ambulatory Visit: Payer: Self-pay

## 2023-06-01 ENCOUNTER — Other Ambulatory Visit: Payer: Self-pay

## 2023-06-27 ENCOUNTER — Other Ambulatory Visit: Payer: Self-pay | Admitting: Pharmacist

## 2023-06-27 ENCOUNTER — Other Ambulatory Visit: Payer: Self-pay

## 2023-06-27 ENCOUNTER — Ambulatory Visit: Payer: 59 | Admitting: Family Medicine

## 2023-06-27 MED ORDER — TIRZEPATIDE 12.5 MG/0.5ML ~~LOC~~ SOAJ
12.5000 mg | SUBCUTANEOUS | 0 refills | Status: DC
  Filled 2023-06-27: qty 2, 28d supply, fill #0

## 2023-06-27 MED ORDER — ATORVASTATIN CALCIUM 80 MG PO TABS
80.0000 mg | ORAL_TABLET | Freq: Every day | ORAL | 3 refills | Status: DC
  Filled 2023-06-27: qty 90, 90d supply, fill #0

## 2023-06-27 MED ORDER — TIRZEPATIDE 15 MG/0.5ML ~~LOC~~ SOAJ
15.0000 mg | SUBCUTANEOUS | 2 refills | Status: DC
  Filled 2023-06-27: qty 6, 84d supply, fill #0
  Filled 2023-07-25: qty 2, 28d supply, fill #0
  Filled 2023-08-19: qty 2, 28d supply, fill #1

## 2023-06-28 ENCOUNTER — Other Ambulatory Visit: Payer: Self-pay

## 2023-07-02 ENCOUNTER — Other Ambulatory Visit: Payer: Self-pay

## 2023-07-05 ENCOUNTER — Other Ambulatory Visit: Payer: Self-pay

## 2023-07-06 ENCOUNTER — Other Ambulatory Visit (HOSPITAL_COMMUNITY): Payer: Self-pay

## 2023-07-06 ENCOUNTER — Telehealth: Payer: Self-pay

## 2023-07-06 ENCOUNTER — Other Ambulatory Visit: Payer: Self-pay

## 2023-07-06 NOTE — Telephone Encounter (Signed)
Pharmacy Patient Advocate Encounter   Received notification from CoverMyMeds that prior authorization for [facility] is required/requested.   Insurance verification completed.   The patient is insured through [employer] .   Per test claim: PA required; PA submitted to [facility] via CoverMyMeds Key/confirmation #/EOC [id number] Status is pending

## 2023-07-09 ENCOUNTER — Other Ambulatory Visit: Payer: Self-pay

## 2023-07-10 ENCOUNTER — Other Ambulatory Visit: Payer: Self-pay

## 2023-07-10 NOTE — Telephone Encounter (Signed)
Pharmacy Patient Advocate Encounter  Received notification from [facility] that Prior Authorization for Marcelline Deist has been APPROVED from 07/05/2023 to 07/04/2024   PA #/Case ID/Reference #: [id number]

## 2023-07-26 ENCOUNTER — Other Ambulatory Visit: Payer: Self-pay

## 2023-07-27 ENCOUNTER — Other Ambulatory Visit: Payer: Self-pay

## 2023-08-20 ENCOUNTER — Other Ambulatory Visit: Payer: Self-pay

## 2023-08-21 ENCOUNTER — Other Ambulatory Visit: Payer: Self-pay

## 2023-08-31 ENCOUNTER — Other Ambulatory Visit: Payer: Self-pay

## 2023-09-04 ENCOUNTER — Other Ambulatory Visit: Payer: Self-pay

## 2023-09-04 ENCOUNTER — Ambulatory Visit: Payer: No Typology Code available for payment source | Attending: Family Medicine | Admitting: Family Medicine

## 2023-09-04 ENCOUNTER — Encounter: Payer: Self-pay | Admitting: Family Medicine

## 2023-09-04 VITALS — BP 131/78 | HR 98 | Ht 66.0 in | Wt 216.2 lb

## 2023-09-04 DIAGNOSIS — K529 Noninfective gastroenteritis and colitis, unspecified: Secondary | ICD-10-CM | POA: Diagnosis not present

## 2023-09-04 DIAGNOSIS — Z7985 Long-term (current) use of injectable non-insulin antidiabetic drugs: Secondary | ICD-10-CM | POA: Diagnosis not present

## 2023-09-04 DIAGNOSIS — E1169 Type 2 diabetes mellitus with other specified complication: Secondary | ICD-10-CM

## 2023-09-04 DIAGNOSIS — L84 Corns and callosities: Secondary | ICD-10-CM

## 2023-09-04 DIAGNOSIS — I152 Hypertension secondary to endocrine disorders: Secondary | ICD-10-CM

## 2023-09-04 DIAGNOSIS — Z794 Long term (current) use of insulin: Secondary | ICD-10-CM

## 2023-09-04 DIAGNOSIS — Z7984 Long term (current) use of oral hypoglycemic drugs: Secondary | ICD-10-CM

## 2023-09-04 DIAGNOSIS — E1159 Type 2 diabetes mellitus with other circulatory complications: Secondary | ICD-10-CM | POA: Diagnosis not present

## 2023-09-04 DIAGNOSIS — Z1231 Encounter for screening mammogram for malignant neoplasm of breast: Secondary | ICD-10-CM

## 2023-09-04 DIAGNOSIS — Z23 Encounter for immunization: Secondary | ICD-10-CM

## 2023-09-04 DIAGNOSIS — Z1211 Encounter for screening for malignant neoplasm of colon: Secondary | ICD-10-CM

## 2023-09-04 DIAGNOSIS — E1149 Type 2 diabetes mellitus with other diabetic neurological complication: Secondary | ICD-10-CM | POA: Diagnosis not present

## 2023-09-04 LAB — POCT GLYCOSYLATED HEMOGLOBIN (HGB A1C): HbA1c, POC (controlled diabetic range): 6.7 % (ref 0.0–7.0)

## 2023-09-04 MED ORDER — TELMISARTAN 80 MG PO TABS
80.0000 mg | ORAL_TABLET | Freq: Every day | ORAL | 1 refills | Status: DC
  Filled 2023-09-04 – 2023-10-02 (×2): qty 90, 90d supply, fill #0
  Filled 2023-12-29: qty 90, 90d supply, fill #1

## 2023-09-04 MED ORDER — ATORVASTATIN CALCIUM 80 MG PO TABS
80.0000 mg | ORAL_TABLET | Freq: Every day | ORAL | 1 refills | Status: DC
  Filled 2023-09-04 – 2023-09-25 (×3): qty 90, 90d supply, fill #0
  Filled 2023-12-24: qty 90, 90d supply, fill #1

## 2023-09-04 MED ORDER — DAPAGLIFLOZIN PROPANEDIOL 10 MG PO TABS
10.0000 mg | ORAL_TABLET | Freq: Every day | ORAL | 1 refills | Status: DC
  Filled 2023-09-04 – 2023-10-02 (×2): qty 90, 90d supply, fill #0
  Filled 2023-12-29: qty 90, 90d supply, fill #1

## 2023-09-04 MED ORDER — TIRZEPATIDE 15 MG/0.5ML ~~LOC~~ SOAJ
15.0000 mg | SUBCUTANEOUS | 6 refills | Status: DC
  Filled 2023-09-04: qty 6, 84d supply, fill #0
  Filled 2023-09-16: qty 2, 28d supply, fill #0
  Filled 2023-10-14: qty 2, 28d supply, fill #1
  Filled 2023-11-13: qty 2, 28d supply, fill #2
  Filled 2023-11-15: qty 2, 28d supply, fill #0
  Filled 2023-12-11: qty 2, 28d supply, fill #1
  Filled 2024-01-05 – 2024-01-07 (×2): qty 2, 28d supply, fill #0
  Filled 2024-02-02: qty 2, 28d supply, fill #1
  Filled 2024-03-01: qty 2, 28d supply, fill #2

## 2023-09-04 MED ORDER — LANTUS SOLOSTAR 100 UNIT/ML ~~LOC~~ SOPN
8.0000 [IU] | PEN_INJECTOR | Freq: Every day | SUBCUTANEOUS | 6 refills | Status: DC
  Filled 2023-09-04: qty 6, 56d supply, fill #0
  Filled 2023-10-27 – 2023-10-29 (×2): qty 6, 56d supply, fill #1
  Filled 2023-12-21: qty 6, 56d supply, fill #2
  Filled 2024-02-16: qty 6, 56d supply, fill #3

## 2023-09-04 MED ORDER — AMLODIPINE BESYLATE 5 MG PO TABS
5.0000 mg | ORAL_TABLET | Freq: Every day | ORAL | 1 refills | Status: DC
  Filled 2023-09-04 – 2023-10-02 (×2): qty 90, 90d supply, fill #0
  Filled 2023-12-29: qty 90, 90d supply, fill #1

## 2023-09-04 MED ORDER — GABAPENTIN 300 MG PO CAPS
300.0000 mg | ORAL_CAPSULE | Freq: Every day | ORAL | 1 refills | Status: DC
  Filled 2023-09-04 – 2023-10-02 (×2): qty 90, 90d supply, fill #0
  Filled 2023-12-31: qty 90, 90d supply, fill #1

## 2023-09-04 NOTE — Patient Instructions (Signed)
 Viral Gastroenteritis, Adult  Viral gastroenteritis is also known as the stomach flu. This condition may affect your stomach, your small intestine, and your large intestine. It can cause sudden watery poop (diarrhea), fever, and vomiting. This condition is caused by certain germs (viruses). These germs can be passed from person to person very easily (are contagious). Having watery poop and vomiting can make you feel weak and cause you to not have enough water in your body (get dehydrated). This can make you tired and thirsty, make you have a dry mouth, and make it so you pee (urinate) less often. It is important to replace the fluids that you lose from having watery poop and vomiting. What are the causes? You can get sick by catching germs from other people. You can also get sick by: Eating food, drinking water, or touching a surface that has the germs on it (is contaminated). Sharing utensils or other personal items with a person who is sick. What increases the risk? Having a weak body defense system (immune system). Living with one or more children who are younger than 2 years. Living in a nursing home. Going on cruise ships. What are the signs or symptoms? Symptoms of this condition start suddenly. Symptoms may last for a few days or for as long as a week. Common symptoms include: Watery poop. Vomiting. Other symptoms include: Fever. Headache. Feeling tired (fatigue). Pain in the belly (abdomen). Chills. Feeling weak. Feeling like you may vomit (nauseous). Muscle aches. Not feeling hungry. How is this treated? This condition typically goes away on its own. The focus of treatment is to replace the fluids that you lose. This condition may be treated with: An ORS (oral rehydration solution). This is a drink that helps you replace fluids and minerals your body lost. It is sold at pharmacies and stores. Medicines to help with your symptoms. Probiotic supplements to reduce symptoms of  watery poop. Fluids given through an IV tube, if needed. Older adults and people with other diseases or a weak body defense system are at higher risk for not having enough water in the body. Follow these instructions at home: Eating and drinking  Take an ORS as told by your doctor. Drink clear fluids in small amounts as you are able. Clear fluids include: Water. Ice chips. Fruit juice that has water added to it (is diluted). Low-calorie sports drinks. Drink enough fluid to keep your pee (urine) pale yellow. Eat small amounts of healthy foods every 3-4 hours as you are able. This may include whole grains, fruits, vegetables, lean meats, and yogurt. Avoid fluids that have a lot of sugar or caffeine in them. This includes energy drinks, sports drinks, and soda. Avoid spicy or fatty foods. Avoid alcohol. General instructions  Wash your hands often. This is very important after you have watery poop or you vomit. If you cannot use soap and water, use hand sanitizer. Make sure that all people in your home wash their hands well and often. Take over-the-counter and prescription medicines only as told by your doctor. Rest at home while you get better. Watch your condition for any changes. Take a warm bath to help with any burning or pain from having watery poop. Keep all follow-up visits. Contact a doctor if: You cannot keep fluids down. Your symptoms get worse. You have new symptoms. You feel light-headed or dizzy. You have muscle cramps. Get help right away if: You have chest pain. You have trouble breathing, or you are breathing very fast.  You have a fast heartbeat. You feel very weak or you faint. You have a very bad headache, a stiff neck, or both. You have a rash. You have very bad pain, cramping, or bloating in your belly. Your skin feels cold and clammy. You feel mixed up (confused). You have pain when you pee. You have signs of not having enough water in the body, such  as: Dark pee, hardly any pee, or no pee. Cracked lips. Dry mouth. Sunken eyes. Feeling very sleepy. Feeling weak. You have signs of bleeding, such as: You see blood in your vomit. Your vomit looks like coffee grounds. You have bloody or black poop or poop that looks like tar. These symptoms may be an emergency. Get help right away. Call 911. Do not wait to see if the symptoms will go away. Do not drive yourself to the hospital. Summary Viral gastroenteritis is also known as the stomach flu. This condition can cause sudden watery poop (diarrhea), fever, and vomiting. These germs can be passed from person to person very easily. Take an ORS (oral rehydration solution) as told by your doctor. This is a drink that is sold at pharmacies and stores. Wash your hands often, especially after having watery poop or vomiting. If you cannot use soap and water, use hand sanitizer. This information is not intended to replace advice given to you by your health care provider. Make sure you discuss any questions you have with your health care provider. Document Revised: 07/18/2021 Document Reviewed: 07/18/2021 Elsevier Patient Education  2024 [employer].

## 2023-09-04 NOTE — Progress Notes (Signed)
Subjective:  Patient ID: Sheila Bishop, female    DOB: Feb 25, 1971  Age: 52 y.o. MRN: [id number]  CC: Medical Management of Chronic Issues (Upset stomach)   HPI Milena M Norell is a 52 y.o. year old female with a history of type 2 diabetes mellitus (A1c 6.7), hypertension, hyperlipidemia who presents today for a follow-up visit.   Interval History: Discussed the use of AI scribe software for clinical note transcription with the patient, who gave verbal consent to proceed.  She presents with a headache and upset stomach. She attributes these symptoms to not eating all day and possibly drinking coffee.  She has had some diarrhea for the last 24 hours and decreased appetite but no abdominal pain.  She recalls using a cream from work for her colonoscopy yesterday.  Her A1c is 6.7 down from 14.3 previously and she endorses adherence with her medications with a few intermittent hypoglycemic episodes when she was sweating at work.  She has overhauled her diet substituting with whole-wheat bread and brown rice.  She also mentions needing refills on her medications, including insulin for diabetes.  She is up-to-date on annual eye exams.  She also mentions having calluses on her feet and a problem with her lower back, for which she wears a brace.  Endorses adherence with her statin and with her antihypertensive.  The patient is due for a colonoscopy and mammogram, and needs to get a flu shot.       Past Medical History:  Diagnosis Date   Breast abscess Nov 2012   left   Depression 2007   Diabetes mellitus 2009   Headache(784.0)    Hypertension 2009   Obesity    Wears glasses     Past Surgical History:  Procedure Laterality Date   BREAST BIOPSY  08/07/2011   Procedure: BREAST BIOPSY;  Surgeon: Maisie Fus A. Cornett, MD;  Location: [facility] OR;  Service: General;  Laterality: Left;  BIOPSY BREAST LEFT SIDE   CESAREAN SECTION  2002    Family History  Problem Relation Age of Onset    Hyperlipidemia Mother    Hypertension Mother    Heart disease Mother     Social History   Socioeconomic History   Marital status: Significant Other    Spouse name: Not on file   Number of children: Not on file   Years of education: Not on file   Highest education level: 10th grade  Occupational History   Not on file  Tobacco Use   Smoking status: Former    Current packs/day: 0.00    Types: Cigarettes    Quit date: 12/31/2007    Years since quitting: 15.6   Smokeless tobacco: Never  Substance and Sexual Activity   Alcohol use: No   Drug use: No   Sexual activity: Not Currently  Other Topics Concern   Not on file  Social History Narrative   Not on file   Social Determinants of Health   Financial Resource Strain: Low Risk  (08/31/2023)   Overall Financial Resource Strain (CARDIA)    Difficulty of Paying Living Expenses: Not hard at all  Food Insecurity: No Food Insecurity (08/31/2023)   Hunger Vital Sign    Worried About Running Out of Food in the Last Year: Never true    Ran Out of Food in the Last Year: Never true  Transportation Needs: Unmet Transportation Needs (08/31/2023)   PRAPARE - Transportation    Lack of Transportation (Medical): Yes    Lack of  Transportation (Non-Medical): Yes  Physical Activity: Insufficiently Active (08/31/2023)   Exercise Vital Sign    Days of Exercise per Week: 3 days    Minutes of Exercise per Session: 10 min  Stress: No Stress Concern Present (08/31/2023)   [employer] of Occupational Health - Occupational Stress Questionnaire    Feeling of Stress : Not at all  Social Connections: Unknown (08/31/2023)   Social Connection and Isolation Panel [NHANES]    Frequency of Communication with Friends and Family: Never    Frequency of Social Gatherings with Friends and Family: Never    Attends Religious Services: Never    Database administrator or Organizations: No    Attends Engineer, structural: Not on file    Marital  Status: Patient declined    Allergies  Allergen Reactions   Metformin And Related Nausea Only   Amoxicillin Itching and Rash    Outpatient Medications Prior to Visit  Medication Sig Dispense Refill   albuterol (VENTOLIN HFA) 108 (90 Base) MCG/ACT inhaler Inhale 2 puffs into the lungs every 6 (six) hours as needed for wheezing or shortness of breath. 18 g 1   Blood Glucose Monitoring Suppl (CONTOUR NEXT MONITOR) w/Device KIT Use to check blood sugar twice daily. E11.69 1 kit 0   Continuous Glucose Receiver (DEXCOM G7 RECEIVER) DEVI Use to check blood sugar continuously throughout the day. E11.69 1 each 0   Continuous Glucose Sensor (DEXCOM G7 SENSOR) MISC Use to check blood sugar continuously throughout the day. Change sensors once every 10 days. E11.69 3 each 6   glucose blood (CONTOUR NEXT TEST) test strip Use to check blood sugar twice daily. E11.69 100 each 6   Insulin Pen Needle (BD PEN NEEDLE NANO U/F) 32G X 4 MM MISC Use a directed nightly for Lantus 100 each 3   meloxicam (MOBIC) 15 MG tablet Take 15 mg by mouth daily.     Microlet Lancets MISC Use to check blood sugar up to 3 times daily. 100 each 11   Microlet Lancets MISC Use to check blood sugar twice daily. E11.69 100 each 6   amLODipine (NORVASC) 5 MG tablet Take 1 tablet (5 mg total) by mouth daily. 90 tablet 1   atorvastatin (LIPITOR) 80 MG tablet Take 1 tablet (80 mg total) by mouth daily. 90 tablet 3   dapagliflozin propanediol (FARXIGA) 10 MG TABS tablet Take 1 tablet (10 mg total) by mouth daily before breakfast. 90 tablet 1   gabapentin (NEURONTIN) 300 MG capsule Take 1 capsule (300 mg total) by mouth at bedtime. 90 capsule 1   insulin glargine (LANTUS SOLOSTAR) 100 UNIT/ML Solostar Pen Inject 10 Units into the skin daily. 30 mL 3   telmisartan (MICARDIS) 80 MG tablet Take 1 tablet (80 mg total) by mouth daily. 90 tablet 1   tirzepatide (MOUNJARO) 12.5 MG/0.5ML Pen Inject 12.5 mg into the skin once a week. For 4 weeks.  Then, increase to the 15mg  weekly dose. 2 mL 0   tirzepatide (MOUNJARO) 15 MG/0.5ML Pen Inject 15 mg into the skin once a week. 6 mL 2   No facility-administered medications prior to visit.     ROS Review of Systems  Constitutional:  Negative for activity change and appetite change.  HENT:  Negative for sinus pressure and sore throat.   Respiratory:  Negative for chest tightness, shortness of breath and wheezing.   Cardiovascular:  Negative for chest pain and palpitations.  Gastrointestinal:  Positive for diarrhea. Negative for abdominal  distention, abdominal pain and constipation.  Genitourinary: Negative.   Musculoskeletal:  Positive for back pain.  Psychiatric/Behavioral:  Negative for behavioral problems and dysphoric mood.     Objective:  BP 131/78   Pulse 98   Ht 5\' 6"  (1.676 m)   Wt 216 lb 3.2 oz (98.1 kg)   SpO2 99%   BMI 34.90 kg/m      09/04/2023    3:25 PM 04/10/2023   10:10 AM 03/04/2023    9:35 AM  BP/Weight  Systolic BP 131 136 162  Diastolic BP 78 84 92  Wt. (Lbs) 216.2 218.2   BMI 34.9 kg/m2 35.22 kg/m2       Physical Exam Constitutional:      Appearance: She is well-developed.  Cardiovascular:     Rate and Rhythm: Normal rate.     Heart sounds: Normal heart sounds. No murmur heard. Pulmonary:     Effort: Pulmonary effort is normal.     Breath sounds: Normal breath sounds. No wheezing or rales.  Chest:     Chest wall: No tenderness.  Abdominal:     General: Bowel sounds are normal. There is no distension.     Palpations: Abdomen is soft. There is no mass.     Tenderness: There is no abdominal tenderness.  Musculoskeletal:        General: Normal range of motion.     Right lower leg: No edema.     Left lower leg: No edema.  Neurological:     Mental Status: She is alert and oriented to person, place, and time.  Psychiatric:        Mood and Affect: Mood normal.        Latest Ref Rng & Units 04/10/2023   11:30 AM 03/09/2021    9:15 AM 09/06/2020     4:11 PM  CMP  Glucose 70 - 99 mg/dL 782  956  213   BUN 6 - 24 mg/dL 31  28  28    Creatinine 0.57 - 1.00 mg/dL 0.86  5.78  4.69   Sodium 134 - 144 mmol/L 133  139  141   Potassium 3.5 - 5.2 mmol/L 4.1  3.9  3.8   Chloride 96 - 106 mmol/L 92  99  101   CO2 20 - 29 mmol/L 27  25  25    Calcium 8.7 - 10.2 mg/dL 62.9  9.8  9.7   Total Protein 6.0 - 8.5 g/dL 7.8  8.0    Total Bilirubin 0.0 - 1.2 mg/dL 0.4  0.2    Alkaline Phos 44 - 121 IU/L 237  162    AST 0 - 40 IU/L 16  17    ALT 0 - 32 IU/L 21  19      Lipid Panel     Component Value Date/Time   CHOL 260 (H) 04/10/2023 1130   TRIG 360 (H) 04/10/2023 1130   HDL 40 04/10/2023 1130   CHOLHDL 4.1 03/09/2021 0915   CHOLHDL 3.9 11/01/2016 1608   VLDL 18 06/16/2010 2054   LDLCALC 153 (H) 04/10/2023 1130   LDLDIRECT 131 (H) 04/21/2010 2016    CBC    Component Value Date/Time   WBC 13.4 (H) 12/24/2019 1618   WBC 14.6 (H) 12/08/2017 2135   RBC 4.11 12/24/2019 1618   RBC 3.91 12/08/2017 2135   HGB 10.4 (L) 12/24/2019 1618   HCT 31.7 (L) 12/24/2019 1618   PLT 297 12/24/2019 1618   MCV 77 (L) 12/24/2019 1618  MCH 25.3 (L) 12/24/2019 1618   MCH 26.1 12/08/2017 2135   MCHC 32.8 12/24/2019 1618   MCHC 33.1 12/08/2017 2135   RDW 14.3 12/24/2019 1618   LYMPHSABS 3.2 (H) 12/24/2019 1618   MONOABS 610 02/23/2016 1052   EOSABS 0.4 12/24/2019 1618   BASOSABS 0.1 12/24/2019 1618    Lab Results  Component Value Date   HGBA1C 6.7 09/04/2023    Assessment & Plan:      Type 2 Diabetes Mellitus Improved glycemic control with A1c of 6.7% from 14.3%. Patient has made significant dietary changes and is adherent to insulin regimen. However, reports of hypoglycemic episodes at work. -Reduce insulin dose from 10 units to 8 units. -Continue current diet and medication regimen. -Check blood glucose levels regularly to monitor for hypoglycemia.  Gastroenteritis Reports of upset stomach, diarrhea, and gas since yesterday. Likely due to  dietary indiscretion. -Advise on BRAT diet (bananas, rice, applesauce, toast) and hydration. -Consider chicken broth to help settle the stomach.  Foot Calluses Reports of painful foot calluses. -Referral to podiatrist for evaluation and treatment.  Hypertension -Controlled -Continue current regimen -Counseled on blood pressure goal of less than 130/80, low-sodium, DASH diet, medication compliance, 150 minutes of moderate intensity exercise per week. Discussed medication compliance, adverse effects.  General Health Maintenance -Referral for colonoscopy and mammogram as patient is due for both. -Administer flu shot today. -Plan to administer shingles vaccine at a later date as per patient's preference. -Schedule follow-up visit in 6 months.          Meds ordered this encounter  Medications   insulin glargine (LANTUS SOLOSTAR) 100 UNIT/ML Solostar Pen    Sig: Inject 8 Units into the skin daily.    Dispense:  30 mL    Refill:  6   amLODipine (NORVASC) 5 MG tablet    Sig: Take 1 tablet (5 mg total) by mouth daily.    Dispense:  90 tablet    Refill:  1   atorvastatin (LIPITOR) 80 MG tablet    Sig: Take 1 tablet (80 mg total) by mouth daily.    Dispense:  90 tablet    Refill:  1   dapagliflozin propanediol (FARXIGA) 10 MG TABS tablet    Sig: Take 1 tablet (10 mg total) by mouth daily before breakfast.    Dispense:  90 tablet    Refill:  1    Dose increase   gabapentin (NEURONTIN) 300 MG capsule    Sig: Take 1 capsule (300 mg total) by mouth at bedtime.    Dispense:  90 capsule    Refill:  1   telmisartan (MICARDIS) 80 MG tablet    Sig: Take 1 tablet (80 mg total) by mouth daily.    Dispense:  90 tablet    Refill:  1   tirzepatide (MOUNJARO) 15 MG/0.5ML Pen    Sig: Inject 15 mg into the skin once a week.    Dispense:  6 mL    Refill:  6    Follow-up: Return in about 6 months (around 03/04/2024) for Chronic medical conditions.       Hoy Register, MD,  FAAFP. [facility] [place], [place] [phone]   09/04/2023, 5:10 PM

## 2023-09-05 ENCOUNTER — Other Ambulatory Visit: Payer: Self-pay

## 2023-09-05 LAB — CMP14+EGFR
ALT: 18 [IU]/L (ref 0–32)
AST: 19 [IU]/L (ref 0–40)
Albumin: 4.3 g/dL (ref 3.9–4.9)
Alkaline Phosphatase: 150 [IU]/L — ABNORMAL HIGH (ref 44–121)
BUN/Creatinine Ratio: 33 — ABNORMAL HIGH (ref 9–23)
BUN: 27 mg/dL — ABNORMAL HIGH (ref 6–24)
Bilirubin Total: 0.3 mg/dL (ref 0.0–1.2)
CO2: 23 mmol/L (ref 20–29)
Calcium: 10 mg/dL (ref 8.7–10.2)
Chloride: 102 mmol/L (ref 96–106)
Creatinine, Ser: 0.81 mg/dL (ref 0.57–1.00)
Globulin, Total: 3 g/dL (ref 1.5–4.5)
Glucose: 99 mg/dL (ref 70–99)
Potassium: 4.2 mmol/L (ref 3.5–5.2)
Sodium: 141 mmol/L (ref 134–144)
Total Protein: 7.3 g/dL (ref 6.0–8.5)
eGFR: 88 mL/min/{1.73_m2} (ref >59)

## 2023-09-05 LAB — CBC WITH DIFFERENTIAL/PLATELET
Basophils Absolute: 0.1 10*3/uL (ref 0.0–0.2)
Basos: 1 %
EOS (ABSOLUTE): 0.7 10*3/uL — ABNORMAL HIGH (ref 0.0–0.4)
Eos: 5 %
Hematocrit: 35.1 % (ref 34.0–46.6)
Hemoglobin: 11.4 g/dL (ref 11.1–15.9)
Immature Grans (Abs): 0 10*3/uL (ref 0.0–0.1)
Immature Granulocytes: 0 %
Lymphocytes Absolute: 3.9 10*3/uL — ABNORMAL HIGH (ref 0.7–3.1)
Lymphs: 26 %
MCH: 25.8 pg — ABNORMAL LOW (ref 26.6–33.0)
MCHC: 32.5 g/dL (ref 31.5–35.7)
MCV: 79 fL (ref 79–97)
Monocytes Absolute: 1.1 10*3/uL — ABNORMAL HIGH (ref 0.1–0.9)
Monocytes: 8 %
Neutrophils Absolute: 9.4 10*3/uL — ABNORMAL HIGH (ref 1.4–7.0)
Neutrophils: 60 %
Platelets: 325 10*3/uL (ref 150–450)
RBC: 4.42 x10E6/uL (ref 3.77–5.28)
RDW: 14.2 % (ref 11.7–15.4)
WBC: 15.2 10*3/uL — ABNORMAL HIGH (ref 3.4–10.8)

## 2023-09-05 LAB — LP+NON-HDL CHOLESTEROL
Cholesterol, Total: 163 mg/dL (ref 100–199)
HDL: 48 mg/dL (ref >39)
LDL Chol Calc (NIH): 98 mg/dL (ref 0–99)
Total Non-HDL-Chol (LDL+VLDL): 115 mg/dL (ref 0–129)
Triglycerides: 94 mg/dL (ref 0–149)
VLDL Cholesterol Cal: 17 mg/dL (ref 5–40)

## 2023-09-10 ENCOUNTER — Other Ambulatory Visit: Payer: Self-pay

## 2023-09-17 ENCOUNTER — Other Ambulatory Visit: Payer: Self-pay

## 2023-09-25 ENCOUNTER — Other Ambulatory Visit: Payer: Self-pay

## 2023-09-27 ENCOUNTER — Other Ambulatory Visit: Payer: Self-pay

## 2023-10-02 ENCOUNTER — Other Ambulatory Visit: Payer: Self-pay

## 2023-10-04 ENCOUNTER — Other Ambulatory Visit: Payer: Self-pay

## 2023-10-05 ENCOUNTER — Other Ambulatory Visit: Payer: Self-pay

## 2023-10-09 ENCOUNTER — Ambulatory Visit
Admission: RE | Admit: 2023-10-09 | Discharge: 2023-10-09 | Disposition: A | Discharge status: Home / Self Care | Payer: No Typology Code available for payment source | Source: Ambulatory Visit | Attending: Family Medicine | Admitting: Family Medicine

## 2023-10-09 DIAGNOSIS — Z1231 Encounter for screening mammogram for malignant neoplasm of breast: Secondary | ICD-10-CM

## 2023-10-11 ENCOUNTER — Other Ambulatory Visit: Payer: Self-pay

## 2023-10-11 ENCOUNTER — Ambulatory Visit: Payer: No Typology Code available for payment source | Admitting: Podiatry

## 2023-10-15 ENCOUNTER — Other Ambulatory Visit: Payer: Self-pay

## 2023-10-18 ENCOUNTER — Other Ambulatory Visit: Payer: Self-pay

## 2023-10-19 ENCOUNTER — Other Ambulatory Visit: Payer: Self-pay | Admitting: Family Medicine

## 2023-10-19 ENCOUNTER — Encounter: Payer: Self-pay | Admitting: Family Medicine

## 2023-10-19 MED ORDER — TIZANIDINE HCL 4 MG PO TABS: 4.0000 mg | ORAL_TABLET | Freq: Three times a day (TID) | ORAL | 1 refills | Status: AC | PRN

## 2023-10-22 ENCOUNTER — Other Ambulatory Visit: Payer: Self-pay | Admitting: Family Medicine

## 2023-10-22 DIAGNOSIS — M25512 Pain in left shoulder: Secondary | ICD-10-CM

## 2023-10-25 ENCOUNTER — Other Ambulatory Visit: Payer: Self-pay

## 2023-10-26 ENCOUNTER — Other Ambulatory Visit: Payer: Self-pay

## 2023-10-29 ENCOUNTER — Other Ambulatory Visit: Payer: Self-pay

## 2023-10-29 ENCOUNTER — Other Ambulatory Visit (HOSPITAL_COMMUNITY): Payer: Self-pay

## 2023-11-13 ENCOUNTER — Other Ambulatory Visit: Payer: Self-pay

## 2023-11-15 ENCOUNTER — Other Ambulatory Visit: Payer: Self-pay

## 2023-11-15 ENCOUNTER — Encounter (HOSPITAL_COMMUNITY): Payer: Self-pay

## 2023-11-15 ENCOUNTER — Other Ambulatory Visit (HOSPITAL_COMMUNITY): Payer: Self-pay

## 2023-11-16 ENCOUNTER — Other Ambulatory Visit: Payer: Self-pay

## 2023-11-23 ENCOUNTER — Other Ambulatory Visit: Payer: Self-pay

## 2023-11-24 ENCOUNTER — Other Ambulatory Visit: Payer: Self-pay | Admitting: Family Medicine

## 2023-11-24 DIAGNOSIS — E1169 Type 2 diabetes mellitus with other specified complication: Secondary | ICD-10-CM

## 2023-11-26 ENCOUNTER — Other Ambulatory Visit (HOSPITAL_COMMUNITY): Payer: Self-pay

## 2023-11-26 MED ORDER — DEXCOM G7 SENSOR MISC
3 refills | Status: DC
  Filled 2023-11-26 – 2023-11-29 (×2): qty 3, 30d supply, fill #0
  Filled 2023-12-23: qty 3, 30d supply, fill #1
  Filled 2024-01-23: qty 3, 30d supply, fill #2
  Filled 2024-02-20: qty 3, 30d supply, fill #3

## 2023-11-27 ENCOUNTER — Other Ambulatory Visit: Payer: Self-pay

## 2023-11-29 ENCOUNTER — Other Ambulatory Visit: Payer: Self-pay

## 2023-11-30 ENCOUNTER — Other Ambulatory Visit: Payer: Self-pay

## 2023-11-30 ENCOUNTER — Encounter: Payer: Self-pay | Admitting: Family Medicine

## 2023-12-03 ENCOUNTER — Encounter: Payer: Self-pay | Admitting: Family Medicine

## 2023-12-03 ENCOUNTER — Other Ambulatory Visit: Payer: Self-pay | Admitting: Family Medicine

## 2023-12-03 DIAGNOSIS — N63 Unspecified lump in unspecified breast: Secondary | ICD-10-CM

## 2023-12-04 ENCOUNTER — Other Ambulatory Visit: Payer: Self-pay | Admitting: Family Medicine

## 2023-12-04 ENCOUNTER — Encounter: Payer: Self-pay | Admitting: Family Medicine

## 2023-12-10 ENCOUNTER — Other Ambulatory Visit: Payer: Self-pay

## 2023-12-13 ENCOUNTER — Other Ambulatory Visit: Payer: Self-pay

## 2023-12-14 ENCOUNTER — Other Ambulatory Visit: Payer: Self-pay

## 2023-12-17 ENCOUNTER — Ambulatory Visit
Admission: RE | Admit: 2023-12-17 | Discharge: 2023-12-17 | Disposition: A | Discharge status: Home / Self Care | Source: Ambulatory Visit | Attending: Family Medicine | Admitting: Family Medicine

## 2023-12-17 ENCOUNTER — Other Ambulatory Visit: Payer: Self-pay | Admitting: Family Medicine

## 2023-12-17 DIAGNOSIS — N63 Unspecified lump in unspecified breast: Secondary | ICD-10-CM

## 2023-12-17 DIAGNOSIS — N6459 Other signs and symptoms in breast: Secondary | ICD-10-CM | POA: Diagnosis not present

## 2023-12-17 DIAGNOSIS — N6315 Unspecified lump in the right breast, overlapping quadrants: Secondary | ICD-10-CM | POA: Diagnosis not present

## 2023-12-18 ENCOUNTER — Encounter: Payer: Self-pay | Admitting: Family Medicine

## 2023-12-27 ENCOUNTER — Other Ambulatory Visit: Payer: Self-pay

## 2023-12-31 ENCOUNTER — Other Ambulatory Visit: Payer: Self-pay

## 2024-01-03 ENCOUNTER — Other Ambulatory Visit: Payer: Self-pay

## 2024-01-07 ENCOUNTER — Other Ambulatory Visit: Payer: Self-pay

## 2024-01-07 ENCOUNTER — Other Ambulatory Visit (HOSPITAL_COMMUNITY): Payer: Self-pay

## 2024-01-11 ENCOUNTER — Other Ambulatory Visit (HOSPITAL_COMMUNITY): Payer: Self-pay

## 2024-02-07 ENCOUNTER — Other Ambulatory Visit: Payer: Self-pay

## 2024-02-11 ENCOUNTER — Other Ambulatory Visit: Payer: Self-pay

## 2024-02-12 ENCOUNTER — Other Ambulatory Visit: Payer: Self-pay

## 2024-02-20 ENCOUNTER — Other Ambulatory Visit: Payer: Self-pay

## 2024-02-21 ENCOUNTER — Other Ambulatory Visit: Payer: Self-pay

## 2024-02-26 ENCOUNTER — Other Ambulatory Visit: Payer: Self-pay

## 2024-02-27 ENCOUNTER — Other Ambulatory Visit: Payer: Self-pay

## 2024-03-03 ENCOUNTER — Other Ambulatory Visit: Payer: Self-pay

## 2024-03-04 ENCOUNTER — Encounter: Payer: Self-pay | Admitting: Family Medicine

## 2024-03-04 ENCOUNTER — Other Ambulatory Visit: Payer: Self-pay

## 2024-03-04 ENCOUNTER — Telehealth: Payer: Self-pay | Admitting: Family Medicine

## 2024-03-04 ENCOUNTER — Telehealth (HOSPITAL_BASED_OUTPATIENT_CLINIC_OR_DEPARTMENT_OTHER): Payer: No Typology Code available for payment source | Admitting: Family Medicine

## 2024-03-04 DIAGNOSIS — Z794 Long term (current) use of insulin: Secondary | ICD-10-CM

## 2024-03-04 DIAGNOSIS — I152 Hypertension secondary to endocrine disorders: Secondary | ICD-10-CM | POA: Diagnosis not present

## 2024-03-04 DIAGNOSIS — E11649 Type 2 diabetes mellitus with hypoglycemia without coma: Secondary | ICD-10-CM

## 2024-03-04 DIAGNOSIS — E1169 Type 2 diabetes mellitus with other specified complication: Secondary | ICD-10-CM

## 2024-03-04 DIAGNOSIS — E1159 Type 2 diabetes mellitus with other circulatory complications: Secondary | ICD-10-CM | POA: Diagnosis not present

## 2024-03-04 DIAGNOSIS — Z7984 Long term (current) use of oral hypoglycemic drugs: Secondary | ICD-10-CM | POA: Diagnosis not present

## 2024-03-04 DIAGNOSIS — Z7985 Long-term (current) use of injectable non-insulin antidiabetic drugs: Secondary | ICD-10-CM | POA: Diagnosis not present

## 2024-03-04 DIAGNOSIS — E1149 Type 2 diabetes mellitus with other diabetic neurological complication: Secondary | ICD-10-CM

## 2024-03-04 MED ORDER — ATORVASTATIN CALCIUM 80 MG PO TABS
80.0000 mg | ORAL_TABLET | Freq: Every day | ORAL | 1 refills | Status: AC
Start: 2024-03-04 — End: ?
  Filled 2024-03-04 – 2024-03-28 (×2): qty 90, 90d supply, fill #0
  Filled 2024-08-07: qty 90, 90d supply, fill #1

## 2024-03-04 MED ORDER — TIRZEPATIDE 15 MG/0.5ML ~~LOC~~ SOAJ
15.0000 mg | SUBCUTANEOUS | 6 refills | Status: AC
  Filled 2024-03-28: qty 2, 28d supply, fill #0
  Filled 2024-04-28: qty 2, 28d supply, fill #1
  Filled 2024-05-28 – 2024-09-03 (×2): qty 2, 28d supply, fill #2
  Filled 2024-09-27 – 2024-10-27 (×3): qty 2, 28d supply, fill #3

## 2024-03-04 MED ORDER — TELMISARTAN 80 MG PO TABS
80.0000 mg | ORAL_TABLET | Freq: Every day | ORAL | 1 refills | Status: DC
  Filled 2024-03-04 – 2024-03-28 (×3): qty 90, 90d supply, fill #0
  Filled 2024-06-30: qty 90, 90d supply, fill #1

## 2024-03-04 MED ORDER — AMLODIPINE BESYLATE 5 MG PO TABS
5.0000 mg | ORAL_TABLET | Freq: Every day | ORAL | 1 refills | Status: DC
  Filled 2024-03-04 – 2024-03-28 (×2): qty 90, 90d supply, fill #0
  Filled 2024-06-30: qty 90, 90d supply, fill #1

## 2024-03-04 MED ORDER — LANTUS SOLOSTAR 100 UNIT/ML ~~LOC~~ SOPN
4.0000 [IU] | PEN_INJECTOR | Freq: Every day | SUBCUTANEOUS | 6 refills | Status: DC
  Filled 2024-03-04: qty 30, fill #0
  Filled 2024-03-28: qty 3, 28d supply, fill #0
  Filled 2024-04-28: qty 15, 84d supply, fill #0

## 2024-03-04 MED ORDER — GABAPENTIN 300 MG PO CAPS
300.0000 mg | ORAL_CAPSULE | Freq: Every day | ORAL | 1 refills | Status: DC
  Filled 2024-03-04 – 2024-03-28 (×2): qty 90, 90d supply, fill #0
  Filled 2024-08-07: qty 90, 90d supply, fill #1

## 2024-03-04 MED ORDER — DAPAGLIFLOZIN PROPANEDIOL 10 MG PO TABS
10.0000 mg | ORAL_TABLET | Freq: Every day | ORAL | 1 refills | Status: DC
  Filled 2024-03-04 – 2024-03-28 (×2): qty 90, 90d supply, fill #0
  Filled 2024-08-07: qty 90, 90d supply, fill #1

## 2024-03-04 NOTE — Telephone Encounter (Signed)
-----   Message from [place] sent at 03/04/2024  9:49 AM EDT ----- Regarding: Lab appointment Good morning, Can you please schedule her for lab appointment this Friday, 03/07/2024 at 9 AM? Thank you, - Dr. Newlin

## 2024-03-04 NOTE — Patient Instructions (Addendum)
 Please call GI for your Colonoscopy: Placed in [facility] [address] [place], [place] [address] PH# [phone]

## 2024-03-04 NOTE — Telephone Encounter (Signed)
 Called & spoke to the patient. Verified name & DOB. Patient is on schedule for lab appointment this Friday 03/07/2024 at 9 am. Patient acknowledged and had no further questions.

## 2024-03-04 NOTE — Progress Notes (Signed)
 Virtual Visit via Video Note  I connected with Sheila Bishop, on 03/04/2024 at 12:48 PM by video enabled telemedicine device and verified that I am speaking with the correct person using two identifiers.   Consent: I discussed the limitations, risks, security and privacy concerns of performing an evaluation and management service by telemedicine and the availability of in person appointments. I also discussed with the patient that there may be a patient responsible charge related to this service. The patient expressed understanding and agreed to proceed.   Location of Patient: Home  Location of Provider: Clinic   Persons participating in Telemedicine visit: Shahd M Hamada Dr. Adan Holms    Discussed the use of AI scribe software for clinical note transcription with the patient, who gave verbal consent to proceed.  History of Present Illness Sheila Bishop is a 53 year old female with  a history of type 2 diabetes mellitus, hypertension, hyperlipidemia diabetes who presents with episodes of hypoglycemia.  Over the past week, she experiences almost daily episodes of hypoglycemia with blood sugar levels as low as 57 mg/dL at work and 70 mg/dL while asleep. She manages these episodes by consuming peppermint. Her medication regimen includes 8 units of Lantus , Mounjaro  15 mg, Farxiga , amlodipine , telmisartan , atorvastatin , and gabapentin , all taken at night. She uses a Dexcom sensor for blood sugar monitoring and has two sensors remaining.  She has lost significant weight, going from size 18 to size 14 in jeans and from a 2X to a large in shirts, attributed to dietary changes such as eliminating fried foods and using air frying, boiling, or broiling methods. She is curious about her current A1c level, with the last recorded A1c being 6.7% in December.  She has gum disease and is scheduled for dental work. She missed her last eye exam due to changes in her vision insurance provider  and needs to reschedule her colonoscopy.      Past Medical History:  Diagnosis Date   Breast abscess Nov 2012   left   Depression 2007   Diabetes mellitus 2009   Headache(784.0)    Hypertension 2009   Obesity    Wears glasses    Allergies  Allergen Reactions   Metformin  And Related Nausea Only   Amoxicillin  Itching and Rash    Current Outpatient Medications on File Prior to Visit  Medication Sig Dispense Refill   albuterol  (VENTOLIN  HFA) 108 (90 Base) MCG/ACT inhaler Inhale 2 puffs into the lungs every 6 (six) hours as needed for wheezing or shortness of breath. 18 g 1   Blood Glucose Monitoring Suppl (CONTOUR NEXT MONITOR) w/Device KIT Use to check blood sugar twice daily. E11.69 1 kit 0   Continuous Glucose Receiver (DEXCOM G7 RECEIVER) DEVI Use to check blood sugar continuously throughout the day. E11.69 1 each 0   Continuous Glucose Sensor (DEXCOM G7 SENSOR) MISC Use to check blood sugar continuously throughout the day. Change sensors once every 10 days. E11.69 3 each 3   glucose blood (CONTOUR NEXT TEST) test strip Use to check blood sugar twice daily. E11.69 100 each 6   Insulin  Pen Needle (BD PEN NEEDLE NANO U/F) 32G X 4 MM MISC Use as directed nightly for Lantus  100 each 3   meloxicam (MOBIC) 15 MG tablet Take 15 mg by mouth daily.     Microlet Lancets MISC Use to check blood sugar up to 3 times daily. 100 each 11   Microlet Lancets MISC Use to check blood sugar twice  daily. E11.69 100 each 6   tiZANidine  (ZANAFLEX ) 4 MG tablet Take 1 tablet (4 mg total) by mouth every 8 (eight) hours as needed. 60 tablet 1   No current facility-administered medications on file prior to visit.    ROS: See HPI  Observations/Objective: Awake, alert, oriented x3 Not in acute distress Normal mood      Latest Ref Rng & Units 09/04/2023    4:04 PM 04/10/2023   11:30 AM 03/09/2021    9:15 AM  CMP  Glucose 70 - 99 mg/dL 99  469  629   BUN 6 - 24 mg/dL 27  31  28    Creatinine 0.57 -  1.00 mg/dL 5.28  4.13  2.44   Sodium 134 - 144 mmol/L 141  133  139   Potassium 3.5 - 5.2 mmol/L 4.2  4.1  3.9   Chloride 96 - 106 mmol/L 102  92  99   CO2 20 - 29 mmol/L 23  27  25    Calcium  8.7 - 10.2 mg/dL 01.0  27.2  9.8   Total Protein 6.0 - 8.5 g/dL 7.3  7.8  8.0   Total Bilirubin 0.0 - 1.2 mg/dL 0.3  0.4  0.2   Alkaline Phos 44 - 121 IU/L 150  237  162   AST 0 - 40 IU/L 19  16  17    ALT 0 - 32 IU/L 18  21  19      Lipid Panel     Component Value Date/Time   CHOL 163 09/04/2023 1604   TRIG 94 09/04/2023 1604   HDL 48 09/04/2023 1604   CHOLHDL 4.1 03/09/2021 0915   CHOLHDL 3.9 11/01/2016 1608   VLDL 18 06/16/2010 2054   LDLCALC 98 09/04/2023 1604   LDLDIRECT 131 (H) 04/21/2010 2016   LABVLDL 17 09/04/2023 1604    Lab Results  Component Value Date   HGBA1C 6.7 09/04/2023    Assessment and plan:  1. Type 2 diabetes mellitus with other specified complication, with long-term current use of insulin  (HCC) (Primary) Controlled with A1c of 6.7 She is currently experiencing hypoglycemia so anticipate a lower A1c Decrease Lantus  from 8 units to 4 units and if hypoglycemia persists consider discontinuing Lantus  altogether Counseled on Diabetic diet, my plate method, 536 minutes of moderate intensity exercise/week Blood sugar logs with fasting goals of 80-120 mg/dl, random of less than 644 and in the event of sugars less than 60 mg/dl or greater than 034 mg/dl encouraged to notify the clinic. Advised on the need for annual eye exams, annual foot exams, Pneumonia vaccine. - insulin  glargine (LANTUS  SOLOSTAR) 100 UNIT/ML Solostar Pen; Inject 4 Units into the skin daily.  Dispense: 30 mL; Refill: 6 - Hemoglobin A1c; Future - Microalbumin / creatinine urine ratio; Future - LP+Non-HDL Cholesterol; Future - CMP14+EGFR; Future - tirzepatide  (MOUNJARO ) 15 MG/0.5ML Pen; Inject 15 mg into the skin once a week.  Dispense: 6 mL; Refill: 6  2. Long term current use of insulin  (HCC) -  insulin  glargine (LANTUS  SOLOSTAR) 100 UNIT/ML Solostar Pen; Inject 4 Units into the skin daily.  Dispense: 30 mL; Refill: 6  3. Hypertension associated with diabetes (HCC) Controlled Counseled on blood pressure goal of less than 130/80, low-sodium, DASH diet, medication compliance, 150 minutes of moderate intensity exercise per week. Discussed medication compliance, adverse effects. - amLODipine  (NORVASC ) 5 MG tablet; Take 1 tablet (5 mg total) by mouth daily.  Dispense: 90 tablet; Refill: 1 - telmisartan  (MICARDIS ) 80 MG tablet; Take 1  tablet (80 mg total) by mouth daily.  Dispense: 90 tablet; Refill: 1  4. Type 2 diabetes mellitus with other neurologic complication, without long-term current use of insulin  (HCC) Stable - dapagliflozin  propanediol (FARXIGA ) 10 MG TABS tablet; Take 1 tablet (10 mg total) by mouth daily before breakfast.  Dispense: 90 tablet; Refill: 1 - gabapentin  (NEURONTIN ) 300 MG capsule; Take 1 capsule (300 mg total) by mouth at bedtime.  Dispense: 90 capsule; Refill: 1  5. Long term current use of oral hypoglycemic drug - dapagliflozin  propanediol (FARXIGA ) 10 MG TABS tablet; Take 1 tablet (10 mg total) by mouth daily before breakfast.  Dispense: 90 tablet; Refill: 1  6. Long-term current use of injectable noninsulin antidiabetic medication - tirzepatide  (MOUNJARO ) 15 MG/0.5ML Pen; Inject 15 mg into the skin once a week.  Dispense: 6 mL; Refill: 6    Meds ordered this encounter  Medications   insulin  glargine (LANTUS  SOLOSTAR) 100 UNIT/ML Solostar Pen    Sig: Inject 4 Units into the skin daily.    Dispense:  30 mL    Refill:  6    Dose decrease   amLODipine  (NORVASC ) 5 MG tablet    Sig: Take 1 tablet (5 mg total) by mouth daily.    Dispense:  90 tablet    Refill:  1   atorvastatin  (LIPITOR) 80 MG tablet    Sig: Take 1 tablet (80 mg total) by mouth daily.    Dispense:  90 tablet    Refill:  1   dapagliflozin  propanediol (FARXIGA ) 10 MG TABS tablet    Sig:  Take 1 tablet (10 mg total) by mouth daily before breakfast.    Dispense:  90 tablet    Refill:  1    Dose increase   gabapentin  (NEURONTIN ) 300 MG capsule    Sig: Take 1 capsule (300 mg total) by mouth at bedtime.    Dispense:  90 capsule    Refill:  1   telmisartan  (MICARDIS ) 80 MG tablet    Sig: Take 1 tablet (80 mg total) by mouth daily.    Dispense:  90 tablet    Refill:  1   tirzepatide  (MOUNJARO ) 15 MG/0.5ML Pen    Sig: Inject 15 mg into the skin once a week.    Dispense:  6 mL    Refill:  6    Follow Up Instructions: Return in about 3 months (around 06/04/2024) for Chronic medical conditions.    I discussed the assessment and treatment plan with the patient. The patient was provided an opportunity to ask questions and all were answered. The patient agreed with the plan and demonstrated an understanding of the instructions.   The patient was advised to call back or seek an in-person evaluation if the symptoms worsen or if the condition fails to improve as anticipated.     I provided 19 minutes total of Telehealth time during this encounter including median intraservice time, reviewing previous notes, investigations, ordering medications, medical decision making, coordinating care and patient verbalized understanding at the end of the visit.     Joaquin Mulberry, MD, FAAFP. [facility] [place], [place] [phone]   03/04/2024, 12:48 PM

## 2024-03-06 ENCOUNTER — Other Ambulatory Visit: Payer: Self-pay

## 2024-03-07 ENCOUNTER — Other Ambulatory Visit

## 2024-03-16 ENCOUNTER — Other Ambulatory Visit: Payer: Self-pay | Admitting: Family Medicine

## 2024-03-16 DIAGNOSIS — E1169 Type 2 diabetes mellitus with other specified complication: Secondary | ICD-10-CM

## 2024-03-17 ENCOUNTER — Other Ambulatory Visit: Payer: Self-pay

## 2024-03-17 MED ORDER — DEXCOM G7 SENSOR MISC
3 refills | Status: DC
  Filled 2024-03-17 (×2): qty 3, fill #0
  Filled 2024-03-20: qty 3, 30d supply, fill #0
  Filled 2024-04-16: qty 3, 30d supply, fill #1
  Filled 2024-05-12 – 2024-05-15 (×3): qty 3, 30d supply, fill #2
  Filled 2024-06-16: qty 3, 30d supply, fill #3

## 2024-03-20 ENCOUNTER — Other Ambulatory Visit: Payer: Self-pay

## 2024-03-24 ENCOUNTER — Other Ambulatory Visit: Payer: Self-pay

## 2024-03-28 ENCOUNTER — Other Ambulatory Visit: Payer: Self-pay

## 2024-03-28 ENCOUNTER — Other Ambulatory Visit

## 2024-03-31 ENCOUNTER — Other Ambulatory Visit: Payer: Self-pay

## 2024-04-07 ENCOUNTER — Other Ambulatory Visit: Payer: Self-pay

## 2024-04-16 ENCOUNTER — Other Ambulatory Visit: Payer: Self-pay

## 2024-04-18 ENCOUNTER — Other Ambulatory Visit: Payer: Self-pay

## 2024-04-28 ENCOUNTER — Other Ambulatory Visit: Payer: Self-pay

## 2024-04-29 ENCOUNTER — Other Ambulatory Visit: Payer: Self-pay

## 2024-05-05 ENCOUNTER — Other Ambulatory Visit: Payer: Self-pay

## 2024-05-12 ENCOUNTER — Other Ambulatory Visit: Payer: Self-pay

## 2024-05-15 ENCOUNTER — Other Ambulatory Visit: Payer: Self-pay

## 2024-05-28 ENCOUNTER — Other Ambulatory Visit: Payer: Self-pay

## 2024-05-30 ENCOUNTER — Encounter: Payer: Self-pay | Admitting: Family Medicine

## 2024-05-30 ENCOUNTER — Other Ambulatory Visit: Payer: Self-pay

## 2024-06-09 ENCOUNTER — Other Ambulatory Visit: Payer: Self-pay

## 2024-06-16 ENCOUNTER — Other Ambulatory Visit: Payer: Self-pay

## 2024-06-19 ENCOUNTER — Other Ambulatory Visit: Payer: Self-pay | Admitting: Family Medicine

## 2024-06-19 DIAGNOSIS — Z794 Long term (current) use of insulin: Secondary | ICD-10-CM

## 2024-06-19 MED ORDER — LANTUS SOLOSTAR 100 UNIT/ML ~~LOC~~ SOPN: 10.0000 [IU] | PEN_INJECTOR | Freq: Every day | SUBCUTANEOUS | 6 refills | Status: DC

## 2024-06-20 ENCOUNTER — Other Ambulatory Visit: Payer: Self-pay

## 2024-06-20 ENCOUNTER — Ambulatory Visit: Admitting: Nurse Practitioner

## 2024-06-23 NOTE — Telephone Encounter (Signed)
 Yes she can be scheduled a video visit but she needs to have a blood sugar log with her so we can review during the visit.   Thanks, EN

## 2024-06-23 NOTE — Telephone Encounter (Signed)
 Can patient be scheduled for a video visit?

## 2024-06-30 ENCOUNTER — Other Ambulatory Visit: Payer: Self-pay

## 2024-07-01 ENCOUNTER — Telehealth: Admitting: Family Medicine

## 2024-07-01 ENCOUNTER — Other Ambulatory Visit: Payer: Self-pay

## 2024-07-01 ENCOUNTER — Encounter: Payer: Self-pay | Admitting: Family Medicine

## 2024-07-01 DIAGNOSIS — I152 Hypertension secondary to endocrine disorders: Secondary | ICD-10-CM

## 2024-07-01 DIAGNOSIS — Z794 Long term (current) use of insulin: Secondary | ICD-10-CM | POA: Diagnosis not present

## 2024-07-01 DIAGNOSIS — Z79899 Other long term (current) drug therapy: Secondary | ICD-10-CM | POA: Diagnosis not present

## 2024-07-01 DIAGNOSIS — E1169 Type 2 diabetes mellitus with other specified complication: Secondary | ICD-10-CM | POA: Diagnosis not present

## 2024-07-01 DIAGNOSIS — E1159 Type 2 diabetes mellitus with other circulatory complications: Secondary | ICD-10-CM | POA: Diagnosis not present

## 2024-07-01 MED ORDER — DEXCOM G7 SENSOR MISC
11 refills | Status: AC
  Filled 2024-07-01: qty 3, fill #0
  Filled 2024-07-07 – 2024-08-07 (×3): qty 3, 30d supply, fill #0
  Filled 2024-09-03: qty 3, 30d supply, fill #1
  Filled 2024-09-27: qty 3, 30d supply, fill #2
  Filled 2024-10-27: qty 3, 30d supply, fill #3

## 2024-07-01 MED ORDER — DEXCOM G7 SENSOR MISC: 11 refills | Status: DC

## 2024-07-01 MED ORDER — AMLODIPINE BESYLATE 5 MG PO TABS: 5.0000 mg | ORAL_TABLET | Freq: Every day | ORAL | 1 refills | Status: DC

## 2024-07-01 NOTE — Progress Notes (Signed)
 Virtual Visit via Video Note  I connected with Sheila Bishop, on 07/01/2024 at 12:28 PM by video enabled telemedicine device and verified that I am speaking with the correct person using two identifiers.   Consent: I discussed the limitations, risks, security and privacy concerns of performing an evaluation and management service by telemedicine and the availability of in person appointments. I also discussed with the patient that there may be a patient responsible charge related to this service. The patient expressed understanding and agreed to proceed.   Location of Patient: Work  Government social research officer of Provider: Clinic   Persons participating in Telemedicine visit: Maggi Hershkowitz Fettes Dr. Delbert    Discussed the use of AI scribe software for clinical note transcription with the patient, who gave verbal consent to proceed.  History of Present Illness Sheila Bishop is a 53 year old female with a history of type 2 diabetes mellitus, hypertension, hyperlipidemia  who presents with difficulty managing blood sugar levels due to medication cost issues.  She is unable to afford Mounjaro , Farxiga , and Lantus  due to increased costs. She has not taken Mounjaro  for over a month, resulting in an A1c increase from 6.7% in December to 7.8% recently (this was obtained at the recent health fair). Blood sugar readings have reached up to 224 mg/dL since stopping Mounjaro .  She currently takes Lantus  at 10 units, increased from 4 units, and Farxiga , but cannot refill Mounjaro . She uses a Dexcom G7 for continuous glucose monitoring. Her three-day average glucose is 140 mg/dL, seven-day average is 846 mg/dL, fourteen-day average is 144 mg/dL, thirty-day average is 139 mg/dL, and ninety-day average is 127 mg/dL. Morning readings range from 80 to 112 mg/dL, with higher readings at bedtime, sometimes reaching 202 mg/dL.  Blood sugar rises after eating, even with small carbohydrate intake like wheat toast.   While on Mounjaro , she had lost weight, with clothing size decreasing from size 20 to size 16.  She is also taking amlodipine , atorvastatin , gabapentin , and telmisartan , with one refill remaining on each. She has not checked her blood pressure recently but it was normal at a recent health fair. She is physically active at work, which involves a lot of movement.      Past Medical History:  Diagnosis Date   Breast abscess Nov 2012   left   Depression 2007   Diabetes mellitus 2009   Headache(784.0)    Hypertension 2009   Obesity    Wears glasses    Allergies  Allergen Reactions   Metformin  And Related Nausea Only   Amoxicillin  Itching and Rash    Current Outpatient Medications on File Prior to Visit  Medication Sig Dispense Refill   albuterol  (VENTOLIN  HFA) 108 (90 Base) MCG/ACT inhaler Inhale 2 puffs into the lungs every 6 (six) hours as needed for wheezing or shortness of breath. 18 g 1   atorvastatin  (LIPITOR) 80 MG tablet Take 1 tablet (80 mg total) by mouth daily. 90 tablet 1   Blood Glucose Monitoring Suppl (CONTOUR NEXT MONITOR) w/Device KIT Use to check blood sugar twice daily. E11.69 1 kit 0   Continuous Glucose Receiver (DEXCOM G7 RECEIVER) DEVI Use to check blood sugar continuously throughout the day. E11.69 1 each 0   dapagliflozin  propanediol (FARXIGA ) 10 MG TABS tablet Take 1 tablet (10 mg total) by mouth daily before breakfast. 90 tablet 1   gabapentin  (NEURONTIN ) 300 MG capsule Take 1 capsule (300 mg total) by mouth at bedtime. 90 capsule 1   glucose  blood (CONTOUR NEXT TEST) test strip Use to check blood sugar twice daily. E11.69 100 each 6   insulin  glargine (LANTUS  SOLOSTAR) 100 UNIT/ML Solostar Pen Inject 10 Units into the skin daily. 30 mL 6   Insulin  Pen Needle (BD PEN NEEDLE NANO U/F) 32G X 4 MM MISC Use as directed nightly for Lantus  100 each 3   meloxicam (MOBIC) 15 MG tablet Take 15 mg by mouth daily.     Microlet Lancets MISC Use to check blood sugar up to  3 times daily. 100 each 11   Microlet Lancets MISC Use to check blood sugar twice daily. E11.69 100 each 6   telmisartan  (MICARDIS ) 80 MG tablet Take 1 tablet (80 mg total) by mouth daily. 90 tablet 1   tirzepatide  (MOUNJARO ) 15 MG/0.5ML Pen Inject 15 mg into the skin once a week. 6 mL 6   tiZANidine  (ZANAFLEX ) 4 MG tablet Take 1 tablet (4 mg total) by mouth every 8 (eight) hours as needed. 60 tablet 1   No current facility-administered medications on file prior to visit.    ROS: See HPI  Observations/Objective: Awake, alert, oriented x3 Not in acute distress Normal mood      Latest Ref Rng & Units 09/04/2023    4:04 PM 04/10/2023   11:30 AM 03/09/2021    9:15 AM  CMP  Glucose 70 - 99 mg/dL 99  593  797   BUN 6 - 24 mg/dL 27  31  28    Creatinine 0.57 - 1.00 mg/dL 9.18  8.94  9.10   Sodium 134 - 144 mmol/L 141  133  139   Potassium 3.5 - 5.2 mmol/L 4.2  4.1  3.9   Chloride 96 - 106 mmol/L 102  92  99   CO2 20 - 29 mmol/L 23  27  25    Calcium  8.7 - 10.2 mg/dL 89.9  89.5  9.8   Total Protein 6.0 - 8.5 g/dL 7.3  7.8  8.0   Total Bilirubin 0.0 - 1.2 mg/dL 0.3  0.4  0.2   Alkaline Phos 44 - 121 IU/L 150  237  162   AST 0 - 40 IU/L 19  16  17    ALT 0 - 32 IU/L 18  21  19      Lipid Panel     Component Value Date/Time   CHOL 163 09/04/2023 1604   TRIG 94 09/04/2023 1604   HDL 48 09/04/2023 1604   CHOLHDL 4.1 03/09/2021 0915   CHOLHDL 3.9 11/01/2016 1608   VLDL 18 06/16/2010 2054   LDLCALC 98 09/04/2023 1604   LDLDIRECT 131 (H) 04/21/2010 2016   LABVLDL 17 09/04/2023 1604    Lab Results  Component Value Date   HGBA1C 6.7 09/04/2023     Assessment and plan:  Assessment & Plan Type 2 diabetes mellitus with other specified complications A1c increased from 6.7% to 7.8%. Blood glucose elevated, especially at bedtime. Mounjaro  discontinued due to cost, possibly worsening control. Current regimen: Lantus  10 units, Farxiga . -She is overdue for fasting labs which were ordered  in 03/2024 but she never came in for them - Check for Mounjaro  copay card or discount. Restart if affordable. - Increase Lantus  dose if Mounjaro  unavailable; will send dosage instructions if that occurs. - Refill Dexcom G7 sensors. - Continue Lantus  10 units and Farxiga . - Schedule fasting blood work for cholesterol, kidney, and liver function.  Hypertension associated with diabetes Managed with amlodipine  and telmisartan . Blood pressure reportedly fine but not recently  checked at home. - Refill amlodipine  and telmisartan . - Encourage regular home blood pressure monitoring.      Meds ordered this encounter  Medications   Continuous Glucose Sensor (DEXCOM G7 SENSOR) MISC    Sig: Use to check blood sugar continuously throughout the day. Change sensors once every 10 days. E11.69    Dispense:  3 each    Refill:  11   amLODipine  (NORVASC ) 5 MG tablet    Sig: Take 1 tablet (5 mg total) by mouth daily.    Dispense:  90 tablet    Refill:  1    Follow Up Instructions: Return in about 3 months (around 09/30/2024) for Chronic medical conditions.    I discussed the assessment and treatment plan with the patient. The patient was provided an opportunity to ask questions and all were answered. The patient agreed with the plan and demonstrated an understanding of the instructions.   The patient was advised to call back or seek an in-person evaluation if the symptoms worsen or if the condition fails to improve as anticipated.     I provided 18 minutes total of Telehealth time during this encounter including median intraservice time, reviewing previous notes, investigations, ordering medications, medical decision making, coordinating care and patient verbalized understanding at the end of the visit.     Corrina Sabin, MD, FAAFP. [facility] [place], [place] [phone]   07/01/2024, 12:28 PM

## 2024-07-01 NOTE — Patient Instructions (Signed)
 VISIT SUMMARY:  Today, we discussed your difficulty managing blood sugar levels due to the high cost of your medications. Your A1c has increased from 6.7% to 7.8% since you stopped taking Mounjaro , and your blood sugar readings have been higher, especially at bedtime. We also reviewed your current medications and overall health.  YOUR PLAN:  -TYPE 2 DIABETES MELLITUS, POORLY CONTROLLED: Type 2 diabetes is a condition where your body does not use insulin  properly, leading to high blood sugar levels. Your A1c has increased, and your blood sugar levels are higher, especially at bedtime. We will check for a Mounjaro  copay card or discount to see if you can restart it. If Mounjaro  is still too expensive, we will increase your Lantus  dose. Continue taking Lantus  at 10 units and Farxiga  as prescribed. We will also refill your Dexcom G7 sensors and schedule fasting blood work to check your cholesterol, kidney, and liver function.  -HYPERTENSION ASSOCIATED WITH DIABETES: Hypertension, or high blood pressure, is common in people with diabetes and can increase the risk of complications. You are currently managing it with amlodipine  and telmisartan . We will refill these medications and encourage you to monitor your blood pressure regularly at home.  INSTRUCTIONS:  Please check if you can get a copay card or discount for Mounjaro  and restart it if affordable. If not, follow the dosage instructions we will send for increasing your Lantus . Continue taking your current medications as prescribed. We will refill your Dexcom G7 sensors. Schedule fasting blood work to check your cholesterol, kidney, and liver function. Also, please monitor your blood pressure regularly at home.

## 2024-07-02 ENCOUNTER — Other Ambulatory Visit: Payer: Self-pay

## 2024-07-04 ENCOUNTER — Other Ambulatory Visit: Payer: Self-pay

## 2024-07-04 DIAGNOSIS — Z794 Long term (current) use of insulin: Secondary | ICD-10-CM

## 2024-07-04 DIAGNOSIS — E1169 Type 2 diabetes mellitus with other specified complication: Secondary | ICD-10-CM

## 2024-07-04 MED ORDER — LANTUS SOLOSTAR 100 UNIT/ML ~~LOC~~ SOPN
10.0000 [IU] | PEN_INJECTOR | Freq: Every day | SUBCUTANEOUS | 6 refills | Status: AC
  Filled 2024-07-04: qty 30, 84d supply, fill #0
  Filled 2024-08-07: qty 9, 84d supply, fill #0
  Filled 2024-10-27: qty 9, 84d supply, fill #1

## 2024-07-07 ENCOUNTER — Other Ambulatory Visit: Payer: Self-pay

## 2024-07-09 ENCOUNTER — Other Ambulatory Visit: Payer: Self-pay

## 2024-07-18 ENCOUNTER — Other Ambulatory Visit: Payer: Self-pay | Admitting: Family Medicine

## 2024-07-18 MED ORDER — NOVOLOG FLEXPEN 100 UNIT/ML ~~LOC~~ SOPN
0.0000 [IU] | PEN_INJECTOR | Freq: Three times a day (TID) | SUBCUTANEOUS | 3 refills | Status: AC
  Filled 2024-07-18: qty 15, 41d supply, fill #0
  Filled 2024-08-07: qty 9, 25d supply, fill #0
  Filled 2024-09-03: qty 9, 25d supply, fill #1
  Filled 2024-10-27: qty 9, 25d supply, fill #2

## 2024-07-21 ENCOUNTER — Other Ambulatory Visit: Payer: Self-pay

## 2024-07-30 ENCOUNTER — Other Ambulatory Visit: Payer: Self-pay

## 2024-08-07 ENCOUNTER — Other Ambulatory Visit: Payer: Self-pay

## 2024-08-08 ENCOUNTER — Other Ambulatory Visit: Payer: Self-pay

## 2024-08-11 ENCOUNTER — Other Ambulatory Visit: Payer: Self-pay

## 2024-08-13 ENCOUNTER — Other Ambulatory Visit: Payer: Self-pay

## 2024-09-04 ENCOUNTER — Other Ambulatory Visit: Payer: Self-pay

## 2024-09-08 ENCOUNTER — Other Ambulatory Visit: Payer: Self-pay

## 2024-09-17 ENCOUNTER — Ambulatory Visit: Payer: Self-pay

## 2024-09-17 NOTE — Telephone Encounter (Signed)
 FYI Only or Action Required?: FYI only for provider: UC.  Patient was last seen in primary care on 07/01/2024 by Delbert Clam, MD.  Called Nurse Triage reporting Dizziness.  Symptoms began several days ago.  Interventions attempted: Nothing.  Symptoms are: unchanged.  Triage Disposition: See Physician Within 24 Hours  Patient/caregiver understands and will follow disposition?: Yes  Copied from CRM #[id number]. Topic: Clinical - Red Word Triage >> Sep 17, 2024  3:50 PM Berneda FALCON wrote: Red Word that prompted transfer to Nurse Triage: Patient states she has been feeling lightheaded and dizzy since Monday. She is also experiencing vomiting off and on and had loose stool as well. She is also experiencing hold and cold temperature fluctuations. Reason for Disposition  [1] MODERATE dizziness (e.g., interferes with normal activities) AND [2] has NOT been evaluated by doctor (or NP/PA) for this  (Exception: Dizziness caused by heat exposure, sudden standing, or poor fluid intake.)  Answer Assessment - Initial Assessment Questions No available appts with pcp. Offered appt with alt prov, patient declines and reports will go to UC.  Advised call back or ED/911 if symptoms worsen. Patient verbalized understanding.  1. DESCRIPTION: Describe your dizziness.     Like I'm getting hot 2. LIGHTHEADED: Do you feel lightheaded? (e.g., somewhat faint, woozy, weak upon standing)     Comes and goes 3. VERTIGO: Do you feel like either you or the room is spinning or tilting? (i.e., vertigo)     no 4. SEVERITY: How bad is it?  Do you feel like you are going to faint? Can you stand and walk?     yes 5. ONSET:  When did the dizziness begin?     Saturday 6. AGGRAVATING FACTORS: Does anything make it worse? (e.g., standing, change in head position)     Comes and goes, even when sitting  8. CAUSE: What do you think is causing the dizziness? (e.g., decreased fluids or food, diarrhea,  emotional distress, heat exposure, new medicine, sudden standing, vomiting; unknown)     Sick exposure 10. OTHER SYMPTOMS: Do you have any other symptoms? (e.g., fever, chest pain, vomiting, diarrhea, bleeding) Denies diff breathing, chest pain,abd pain, cough, congestion sore throat  Unsure fever, chills, n/v/d, HA, runny nose Diarrhea; last bm, loose stool, x1 Vomiting, x1, emesis contents food  Protocols used: Dizziness - Lightheadedness-A-AH

## 2024-09-18 ENCOUNTER — Ambulatory Visit
Admission: RE | Admit: 2024-09-18 | Discharge: 2024-09-18 | Disposition: A | Discharge status: Home / Self Care | Source: Ambulatory Visit

## 2024-09-18 VITALS — BP 143/84 | HR 88 | Temp 97.9°F | Resp 18 | Wt 216.3 lb

## 2024-09-18 DIAGNOSIS — K529 Noninfective gastroenteritis and colitis, unspecified: Secondary | ICD-10-CM

## 2024-09-18 LAB — POC COVID19/FLU A&B COMBO
Covid Antigen, POC: NEGATIVE
Influenza A Antigen, POC: NEGATIVE
Influenza B Antigen, POC: NEGATIVE

## 2024-09-18 NOTE — ED Provider Notes (Signed)
 [facility]    CSN: [id number] Arrival date & time: 09/18/24  1134      History   Chief Complaint No chief complaint on file.   HPI Sheila Bishop is a 53 y.o. female.   Pt presents today due to nausea, vomiting, diarrhea, lightheadedness, and dizziness that started 4 days ago. Pt states she has experienced 5 episodes of loose stool that are gradually decreasing. Pt states that she has experienced 2 episodes of vomiting. Pt states that she has been taking Tylenol , pedialyte, and increased water intake. 10 hrs before symptoms started she ate 2 shrimp egg rolls. Pt states that she was concerned about exposure to covid and flu and wanted to be tested for both in office today.   The history is provided by the patient.    Past Medical History:  Diagnosis Date   Breast abscess Nov 2012   left   Depression 2007   Diabetes mellitus 2009   Headache(784.0)    Hypertension 2009   Obesity    Wears glasses     Patient Active Problem List   Diagnosis Date Noted   Class 2 severe obesity due to excess calories with serious comorbidity and body mass index (BMI) of 37.0 to 37.9 in adult 08/20/2018   Non-intractable vomiting with nausea 01/04/2017   Acute non-recurrent frontal sinusitis 01/04/2017   Irregular menses 02/10/2015   Dyslipidemia associated with type 2 diabetes mellitus (HCC) 02/10/2015   Diabetes type 2, uncontrolled 11/26/2014   Pain due to onychomycosis of toenail 11/26/2014   Hypertension 11/01/2014    Past Surgical History:  Procedure Laterality Date   BREAST BIOPSY  08/07/2011   Procedure: BREAST BIOPSY;  Surgeon: Debby A. Cornett, MD;  Location: [facility] OR;  Service: General;  Laterality: Left;  BIOPSY BREAST LEFT SIDE   BREAST EXCISIONAL BIOPSY     CESAREAN SECTION  10/02/2000    OB History     Gravida  4   Para  3   Term  3   Preterm  0   AB  1   Living  4      SAB  1   IAB      Ectopic      Multiple  1   Live Births                Home Medications    Prior to Admission medications  Medication Sig Start Date End Date Taking? Authorizing Provider  atorvastatin  (LIPITOR) 80 MG tablet Take 1 tablet (80 mg total) by mouth daily. 03/04/24  Yes Newlin, Enobong, MD  insulin  glargine-yfgn (SEMGLEE ) 100 UNIT/ML Pen SMARTSIG:10 Unit(s) SUB-Q Daily 06/19/24  Yes [provider]  telmisartan  (MICARDIS ) 80 MG tablet Take 1 tablet (80 mg total) by mouth daily. 03/04/24  Yes Newlin, Enobong, MD  albuterol  (VENTOLIN  HFA) 108 (90 Base) MCG/ACT inhaler Inhale 2 puffs into the lungs every 6 (six) hours as needed for wheezing or shortness of breath. 12/24/19   Danton Jon CHRISTELLA, PA-C  amLODipine  (NORVASC ) 5 MG tablet Take 1 tablet (5 mg total) by mouth daily. 07/01/24   Newlin, Enobong, MD  Blood Glucose Monitoring Suppl (CONTOUR NEXT MONITOR) w/Device KIT Use to check blood sugar twice daily. E11.69 09/06/20   Delbert Clam, MD  Continuous Glucose Receiver (DEXCOM G7 RECEIVER) DEVI Use to check blood sugar continuously throughout the day. E11.69 05/02/23   Delbert Clam, MD  Continuous Glucose Sensor (DEXCOM G7 SENSOR) MISC Use to check blood sugar continuously  throughout the day. Change sensors once every 10 days. E11.69 07/01/24   Newlin, Enobong, MD  dapagliflozin  propanediol (FARXIGA ) 10 MG TABS tablet Take 1 tablet (10 mg total) by mouth daily before breakfast. 03/04/24   Delbert Clam, MD  gabapentin  (NEURONTIN ) 300 MG capsule Take 1 capsule (300 mg total) by mouth at bedtime. 03/04/24   Newlin, Enobong, MD  glucose blood (CONTOUR NEXT TEST) test strip Use to check blood sugar twice daily. E11.69 09/06/20   Newlin, Enobong, MD  insulin  aspart (NOVOLOG  FLEXPEN) 100 UNIT/ML FlexPen Inject 0-12 Units into the skin 3 (three) times daily with meals. Per sliding scale.For blood sugars 0-150 give 0 units of insulin , 151-200 give 2 units of insulin , 201-250 give 4 units, 251-300 give 6 units, 301-350 give 8 units, 351-400 give 10  units,> 400 give 12 units and call M.D. 07/18/24   Newlin, Enobong, MD  insulin  glargine (LANTUS  SOLOSTAR) 100 UNIT/ML Solostar Pen Inject 10 Units into the skin daily. 07/04/24   Newlin, Enobong, MD  Insulin  Pen Needle (BD PEN NEEDLE NANO U/F) 32G X 4 MM MISC Use as directed nightly for Lantus  05/29/23   Newlin, Enobong, MD  meloxicam (MOBIC) 15 MG tablet Take 15 mg by mouth daily. 09/19/19   [provider]  Microlet Lancets MISC Use to check blood sugar up to 3 times daily. 05/19/19   Newlin, Enobong, MD  Microlet Lancets MISC Use to check blood sugar twice daily. E11.69 09/06/20   Newlin, Enobong, MD  tirzepatide  (MOUNJARO ) 15 MG/0.5ML Pen Inject 15 mg into the skin once a week. 03/04/24   Newlin, Enobong, MD  tiZANidine  (ZANAFLEX ) 4 MG tablet Take 1 tablet (4 mg total) by mouth every 8 (eight) hours as needed. 10/19/23   Delbert Clam, MD    Family History Family History  Problem Relation Age of Onset   Hyperlipidemia Mother    Hypertension Mother    Heart disease Mother     Social History Social History[1]   Allergies   Metformin  and related and Amoxicillin    Review of Systems Review of Systems   Physical Exam Triage Vital Signs ED Triage Vitals  Encounter Vitals Group     BP 09/18/24 1223 (!) 143/84     Girls Systolic BP Percentile --      Girls Diastolic BP Percentile --      Boys Systolic BP Percentile --      Boys Diastolic BP Percentile --      Pulse Rate 09/18/24 1223 88     Resp 09/18/24 1223 18     Temp 09/18/24 1223 97.9 F (36.6 C)     Temp Source 09/18/24 1223 Oral     SpO2 09/18/24 1223 97 %     Weight 09/18/24 1222 216 lb 4.3 oz (98.1 kg)     Height --      Head Circumference --      Peak Flow --      Pain Score 09/18/24 1220 6     Pain Loc --      Pain Education --      Exclude from Growth Chart --    No data found.  Updated Vital Signs BP (!) 143/84 (BP Location: Left Arm)   Pulse 88   Temp 97.9 F (36.6 C) (Oral)   Resp 18   Wt  216 lb 4.3 oz (98.1 kg)   LMP  (LMP Unknown)   SpO2 97%   BMI 34.91 kg/m   Visual Acuity Right Eye  Distance:   Left Eye Distance:   Bilateral Distance:    Right Eye Near:   Left Eye Near:    Bilateral Near:     Physical Exam Vitals and nursing note reviewed.  Constitutional:      General: She is not in acute distress.    Appearance: Normal appearance. She is not ill-appearing, toxic-appearing or diaphoretic.  Eyes:     General: No scleral icterus. Cardiovascular:     Rate and Rhythm: Normal rate and regular rhythm.     Heart sounds: Normal heart sounds.  Pulmonary:     Effort: Pulmonary effort is normal. No respiratory distress.     Breath sounds: Normal breath sounds. No wheezing or rhonchi.  Abdominal:     General: Abdomen is flat. Bowel sounds are normal.     Palpations: Abdomen is soft.     Tenderness: There is no abdominal tenderness. There is no right CVA tenderness or left CVA tenderness.  Skin:    General: Skin is warm.  Neurological:     Mental Status: She is alert and oriented to person, place, and time.  Psychiatric:        Mood and Affect: Mood normal.        Behavior: Behavior normal.      UC Treatments / Results  Labs (all labs ordered are listed, but only abnormal results are displayed) Labs Reviewed  POC COVID19/FLU A&B COMBO - Normal    EKG   Radiology No results found.  Procedures Procedures (including critical care time)  Medications Ordered in UC Medications - No data to display  Initial Impression / Assessment and Plan / UC Course  I have reviewed the triage vital signs and the nursing notes.  Pertinent labs & imaging results that were available during my care of the patient were reviewed by me and considered in my medical decision making (see chart for details).     Final Clinical Impressions(s) / UC Diagnoses   Final diagnoses:  Acute gastroenteritis     Discharge Instructions      You have been diagnosed with a  gastrointestinal issue today with symptoms of nausea, vomiting, and/or diarrhea.  It is best that you eat a bland diet which includes dry toast, applesauce, bananas, chicken broth, plain rice, or plain mashed potatoes eat these things with no salt-and-pepper, excessive butter, or other seasonings.  It is also important to drink plenty of fluids as you are losing a lot of electrolytes due to vomiting and having diarrhea.  Diluted Gatorade, water, Pedialyte, liquid IV, etc. are good choices for fluid intake.  You may also use popsicles and/or Italian ice.  It is recommended that you do not use anything to stop your diarrhea as it is your body's way of getting rid of the offending bug.  If your symptoms are not improving within 3 to 5 days, you are experiencing significant fatigue, or you are urinating less frequently you will need to follow-up with your PCP or report to the ER.     ED Prescriptions   None    PDMP not reviewed this encounter.    [1]  Social History Tobacco Use   Smoking status: Former    Current packs/day: 0.00    Types: Cigarettes    Quit date: 12/31/2007    Years since quitting: 16.7    Passive exposure: Current   Smokeless tobacco: Never  Vaping Use   Vaping status: Never Used  Substance Use Topics   Alcohol use:  No   Drug use: No     Andra Corean BROCKS, PA-C 09/18/24 1310

## 2024-09-18 NOTE — ED Triage Notes (Signed)
 Pt presents c/o emesis, diarrhea, headache, and body aches x 4 days. Pt states,  Well, it started Saturday. I started vomiting and got light headed and dizzy. Sunday I kept getting the chills and light headed and dizzy again. I went to work Monday at work I had a fever and felt like I was moving in slow motion. I am still getting chills even while I was waiting in the lobby. I think I have the flu or covid. There was COVID in the building in a different dept. I need to make sure I don't have it.  Pt requesting COVID and Flu testing.

## 2024-09-18 NOTE — Discharge Instructions (Signed)

## 2024-09-27 ENCOUNTER — Other Ambulatory Visit: Payer: Self-pay | Admitting: Family Medicine

## 2024-09-27 DIAGNOSIS — I152 Hypertension secondary to endocrine disorders: Secondary | ICD-10-CM

## 2024-09-28 ENCOUNTER — Other Ambulatory Visit: Payer: Self-pay

## 2024-09-28 MED ORDER — TELMISARTAN 80 MG PO TABS
80.0000 mg | ORAL_TABLET | Freq: Every day | ORAL | 0 refills | Status: DC
  Filled 2024-09-28: qty 30, 30d supply, fill #0

## 2024-09-29 ENCOUNTER — Other Ambulatory Visit: Payer: Self-pay

## 2024-09-29 ENCOUNTER — Other Ambulatory Visit: Payer: Self-pay | Admitting: Family Medicine

## 2024-09-29 DIAGNOSIS — I152 Hypertension secondary to endocrine disorders: Secondary | ICD-10-CM

## 2024-09-30 ENCOUNTER — Other Ambulatory Visit: Payer: Self-pay

## 2024-09-30 MED ORDER — AMLODIPINE BESYLATE 5 MG PO TABS
5.0000 mg | ORAL_TABLET | Freq: Every day | ORAL | 0 refills | Status: DC
  Filled 2024-09-30: qty 90, 90d supply, fill #0

## 2024-10-03 ENCOUNTER — Other Ambulatory Visit: Payer: Self-pay

## 2024-10-27 ENCOUNTER — Other Ambulatory Visit: Payer: Self-pay

## 2024-10-27 ENCOUNTER — Other Ambulatory Visit: Payer: Self-pay | Admitting: Family Medicine

## 2024-10-27 DIAGNOSIS — E1159 Type 2 diabetes mellitus with other circulatory complications: Secondary | ICD-10-CM

## 2024-10-27 DIAGNOSIS — E1169 Type 2 diabetes mellitus with other specified complication: Secondary | ICD-10-CM

## 2024-10-27 DIAGNOSIS — E1149 Type 2 diabetes mellitus with other diabetic neurological complication: Secondary | ICD-10-CM

## 2024-10-27 DIAGNOSIS — Z7984 Long term (current) use of oral hypoglycemic drugs: Secondary | ICD-10-CM

## 2024-10-27 MED ORDER — AMLODIPINE BESYLATE 5 MG PO TABS
5.0000 mg | ORAL_TABLET | Freq: Every day | ORAL | 0 refills | Status: AC
  Filled 2024-10-27: qty 90, 90d supply, fill #0

## 2024-10-27 MED ORDER — TELMISARTAN 80 MG PO TABS
80.0000 mg | ORAL_TABLET | Freq: Every day | ORAL | 1 refills | Status: AC
  Filled 2024-10-27: qty 90, 90d supply, fill #0

## 2024-10-27 MED ORDER — GABAPENTIN 300 MG PO CAPS
300.0000 mg | ORAL_CAPSULE | Freq: Every day | ORAL | 1 refills | Status: AC
  Filled 2024-10-27 – 2024-11-04 (×2): qty 90, 90d supply, fill #0

## 2024-10-27 MED ORDER — DAPAGLIFLOZIN PROPANEDIOL 10 MG PO TABS
10.0000 mg | ORAL_TABLET | Freq: Every day | ORAL | 1 refills | Status: AC
  Filled 2024-10-27: qty 90, 90d supply, fill #0

## 2024-10-30 ENCOUNTER — Other Ambulatory Visit: Payer: Self-pay | Admitting: Family Medicine

## 2024-10-30 DIAGNOSIS — E1169 Type 2 diabetes mellitus with other specified complication: Secondary | ICD-10-CM

## 2024-10-31 ENCOUNTER — Other Ambulatory Visit: Payer: Self-pay

## 2024-11-03 ENCOUNTER — Other Ambulatory Visit: Payer: Self-pay

## 2024-11-04 ENCOUNTER — Other Ambulatory Visit: Payer: Self-pay

## 2024-11-04 NOTE — Progress Notes (Signed)
 Sheila Bishop                                          MRN: [id number]   11/04/2024   The [facility] Quality Team Specialist reviewed this patient medical record for the purposes of chart review for care gap closure. The following were reviewed: chart review for care gap closure-diabetic eye exam, glycemic status assessment, and kidney health evaluation for diabetes:eGFR  and uACR.    [facility]BCI Quality Team

## 2024-12-04 ENCOUNTER — Ambulatory Visit: Payer: Self-pay | Admitting: Family Medicine
